# Patient Record
Sex: Female | Born: 1960 | Hispanic: Yes | Smoking: Never smoker
Health system: Southern US, Community
[De-identification: ages and names within clinical notes are randomized; demographics above are authoritative.]

## PROBLEM LIST (undated history)

## (undated) DIAGNOSIS — C73 Malignant neoplasm of thyroid gland: Secondary | ICD-10-CM

## (undated) DIAGNOSIS — M4800 Spinal stenosis, site unspecified: Secondary | ICD-10-CM

## (undated) DIAGNOSIS — I2699 Other pulmonary embolism without acute cor pulmonale: Secondary | ICD-10-CM

## (undated) DIAGNOSIS — E785 Hyperlipidemia, unspecified: Secondary | ICD-10-CM

## (undated) DIAGNOSIS — M797 Fibromyalgia: Secondary | ICD-10-CM

## (undated) DIAGNOSIS — G4730 Sleep apnea, unspecified: Secondary | ICD-10-CM

## (undated) DIAGNOSIS — I10 Essential (primary) hypertension: Secondary | ICD-10-CM

## (undated) DIAGNOSIS — K219 Gastro-esophageal reflux disease without esophagitis: Secondary | ICD-10-CM

## (undated) DIAGNOSIS — E119 Type 2 diabetes mellitus without complications: Secondary | ICD-10-CM

## (undated) DIAGNOSIS — Q652 Congenital dislocation of hip, unspecified: Secondary | ICD-10-CM

## (undated) DIAGNOSIS — IMO0001 Reserved for inherently not codable concepts without codable children: Secondary | ICD-10-CM

## (undated) DIAGNOSIS — M779 Enthesopathy, unspecified: Secondary | ICD-10-CM

## (undated) DIAGNOSIS — H9190 Unspecified hearing loss, unspecified ear: Secondary | ICD-10-CM

## (undated) DIAGNOSIS — I82409 Acute embolism and thrombosis of unspecified deep veins of unspecified lower extremity: Secondary | ICD-10-CM

## (undated) DIAGNOSIS — E669 Obesity, unspecified: Secondary | ICD-10-CM

## (undated) DIAGNOSIS — E039 Hypothyroidism, unspecified: Secondary | ICD-10-CM

## (undated) DIAGNOSIS — M109 Gout, unspecified: Secondary | ICD-10-CM

## (undated) DIAGNOSIS — R7303 Prediabetes: Secondary | ICD-10-CM

## (undated) HISTORY — DX: Enthesopathy, unspecified: M77.9

## (undated) HISTORY — PX: TOTAL HIP REVISION: SHX763

## (undated) HISTORY — PX: THYROID SURGERY: SHX805

## (undated) HISTORY — DX: Other pulmonary embolism without acute cor pulmonale: I26.99

## (undated) HISTORY — DX: Acute embolism and thrombosis of unspecified deep veins of unspecified lower extremity: I82.409

## (undated) HISTORY — DX: Spinal stenosis, site unspecified: M48.00

## (undated) HISTORY — PX: HIP SURGERY: SHX245

## (undated) HISTORY — PX: LIMB SPARING RESECTION HIP W/ SADDLE JOINT REPLACEMENT: SUR829

## (undated) HISTORY — DX: Type 2 diabetes mellitus without complications: E11.9

## (undated) HISTORY — DX: Unspecified hearing loss, unspecified ear: H91.90

## (undated) HISTORY — DX: Gout, unspecified: M10.9

---

## 2007-03-05 ENCOUNTER — Ambulatory Visit: Payer: Self-pay | Admitting: Pain Medicine

## 2007-03-11 ENCOUNTER — Ambulatory Visit: Payer: Self-pay | Admitting: Pain Medicine

## 2007-03-31 ENCOUNTER — Ambulatory Visit: Payer: Self-pay | Admitting: Pain Medicine

## 2007-04-02 ENCOUNTER — Ambulatory Visit: Payer: Self-pay | Admitting: Rheumatology

## 2007-04-06 ENCOUNTER — Ambulatory Visit: Payer: Self-pay | Admitting: Pain Medicine

## 2007-05-26 ENCOUNTER — Ambulatory Visit: Payer: Self-pay | Admitting: Pain Medicine

## 2007-06-08 ENCOUNTER — Ambulatory Visit: Payer: Self-pay | Admitting: Pain Medicine

## 2007-06-18 ENCOUNTER — Ambulatory Visit: Payer: Self-pay | Admitting: Pain Medicine

## 2007-06-23 ENCOUNTER — Ambulatory Visit: Payer: Self-pay | Admitting: Unknown Physician Specialty

## 2007-06-24 ENCOUNTER — Ambulatory Visit: Payer: Self-pay | Admitting: Pain Medicine

## 2007-07-23 ENCOUNTER — Ambulatory Visit: Payer: Self-pay | Admitting: Pain Medicine

## 2007-08-19 ENCOUNTER — Ambulatory Visit: Payer: Self-pay | Admitting: Pain Medicine

## 2007-08-22 ENCOUNTER — Emergency Department: Payer: Self-pay | Admitting: Emergency Medicine

## 2007-09-29 ENCOUNTER — Ambulatory Visit: Payer: Self-pay | Admitting: Pain Medicine

## 2007-10-01 ENCOUNTER — Ambulatory Visit: Payer: Self-pay | Admitting: Internal Medicine

## 2008-02-15 ENCOUNTER — Ambulatory Visit: Payer: Self-pay | Admitting: Pain Medicine

## 2008-04-20 ENCOUNTER — Ambulatory Visit: Payer: Self-pay | Admitting: Internal Medicine

## 2008-04-25 ENCOUNTER — Ambulatory Visit: Payer: Self-pay | Admitting: Pain Medicine

## 2008-05-24 ENCOUNTER — Ambulatory Visit: Payer: Self-pay | Admitting: Pain Medicine

## 2008-06-23 ENCOUNTER — Ambulatory Visit: Payer: Self-pay | Admitting: Pain Medicine

## 2008-07-16 ENCOUNTER — Ambulatory Visit: Payer: Self-pay

## 2008-08-28 ENCOUNTER — Emergency Department: Payer: Self-pay | Admitting: Emergency Medicine

## 2008-09-14 ENCOUNTER — Encounter: Payer: Self-pay | Admitting: Internal Medicine

## 2008-10-03 ENCOUNTER — Encounter: Payer: Self-pay | Admitting: Internal Medicine

## 2008-10-31 ENCOUNTER — Encounter: Payer: Self-pay | Admitting: Internal Medicine

## 2008-12-01 ENCOUNTER — Encounter: Payer: Self-pay | Admitting: Internal Medicine

## 2008-12-28 ENCOUNTER — Ambulatory Visit: Payer: Self-pay | Admitting: Internal Medicine

## 2009-01-12 ENCOUNTER — Ambulatory Visit: Payer: Self-pay | Admitting: Internal Medicine

## 2009-02-14 ENCOUNTER — Ambulatory Visit: Payer: Self-pay | Admitting: Internal Medicine

## 2009-03-07 ENCOUNTER — Ambulatory Visit: Payer: Self-pay | Admitting: Pain Medicine

## 2009-03-15 ENCOUNTER — Ambulatory Visit: Payer: Self-pay | Admitting: Pain Medicine

## 2009-05-16 ENCOUNTER — Ambulatory Visit: Payer: Self-pay | Admitting: Pain Medicine

## 2009-05-22 ENCOUNTER — Ambulatory Visit: Payer: Self-pay | Admitting: Pain Medicine

## 2009-06-15 ENCOUNTER — Ambulatory Visit: Payer: Self-pay | Admitting: Pain Medicine

## 2009-06-21 ENCOUNTER — Ambulatory Visit: Payer: Self-pay | Admitting: Pain Medicine

## 2009-08-01 ENCOUNTER — Ambulatory Visit: Payer: Self-pay | Admitting: Cardiovascular Disease

## 2009-08-02 ENCOUNTER — Ambulatory Visit: Payer: Self-pay | Admitting: Pain Medicine

## 2009-08-31 ENCOUNTER — Ambulatory Visit: Payer: Self-pay | Admitting: Pain Medicine

## 2009-09-20 ENCOUNTER — Ambulatory Visit: Payer: Self-pay | Admitting: Pain Medicine

## 2009-10-02 ENCOUNTER — Ambulatory Visit: Payer: Self-pay | Admitting: Pain Medicine

## 2009-10-26 ENCOUNTER — Ambulatory Visit: Payer: Self-pay | Admitting: Pain Medicine

## 2010-02-21 ENCOUNTER — Ambulatory Visit: Payer: Self-pay | Admitting: Internal Medicine

## 2010-06-12 ENCOUNTER — Ambulatory Visit: Payer: Self-pay | Admitting: Internal Medicine

## 2010-07-16 ENCOUNTER — Ambulatory Visit: Payer: Self-pay | Admitting: Neurology

## 2010-09-10 ENCOUNTER — Ambulatory Visit: Payer: Self-pay

## 2010-09-18 ENCOUNTER — Ambulatory Visit: Payer: Self-pay

## 2011-03-08 ENCOUNTER — Ambulatory Visit: Payer: Self-pay | Admitting: Emergency Medicine

## 2011-03-12 ENCOUNTER — Ambulatory Visit: Payer: Self-pay

## 2011-03-20 ENCOUNTER — Ambulatory Visit: Payer: Self-pay | Admitting: Internal Medicine

## 2011-04-23 ENCOUNTER — Encounter: Payer: Self-pay | Admitting: Rheumatology

## 2011-09-09 ENCOUNTER — Ambulatory Visit: Payer: Self-pay

## 2011-10-01 ENCOUNTER — Emergency Department: Payer: Self-pay | Admitting: *Deleted

## 2011-10-01 LAB — COMPREHENSIVE METABOLIC PANEL
Albumin: 3.6 g/dL (ref 3.4–5.0)
BUN: 10 mg/dL (ref 7–18)
Bilirubin,Total: 0.3 mg/dL (ref 0.2–1.0)
Calcium, Total: 9.1 mg/dL (ref 8.5–10.1)
EGFR (African American): >60
EGFR (Non-African Amer.): >60
Glucose: 111 mg/dL — ABNORMAL HIGH (ref 65–99)
Osmolality: 281 (ref 275–301)
Potassium: 3.6 mmol/L (ref 3.5–5.1)
SGOT(AST): 17 U/L (ref 15–37)
SGPT (ALT): 28 U/L
Total Protein: 7.3 g/dL (ref 6.4–8.2)

## 2011-10-01 LAB — CBC WITH DIFFERENTIAL/PLATELET
Basophil #: 0.1 10*3/uL (ref 0.0–0.1)
Eosinophil #: 0.1 10*3/uL (ref 0.0–0.7)
Eosinophil %: 1 %
HGB: 13.3 g/dL (ref 12.0–16.0)
Lymphocyte #: 2.1 10*3/uL (ref 1.0–3.6)
MCH: 29.7 pg (ref 26.0–34.0)
MCHC: 33.8 g/dL (ref 32.0–36.0)
MCV: 88 fL (ref 80–100)
Monocyte #: 0.8 10*3/uL — ABNORMAL HIGH (ref 0.0–0.7)
Neutrophil %: 70.3 %
Platelet: 231 10*3/uL (ref 150–440)
RBC: 4.46 10*6/uL (ref 3.80–5.20)
RDW: 12.5 % (ref 11.5–14.5)
WBC: 10.1 10*3/uL (ref 3.6–11.0)

## 2012-04-07 ENCOUNTER — Ambulatory Visit: Payer: Self-pay | Admitting: Internal Medicine

## 2012-07-31 ENCOUNTER — Ambulatory Visit: Payer: Self-pay | Admitting: Otolaryngology

## 2013-03-18 DIAGNOSIS — M5136 Other intervertebral disc degeneration, lumbar region: Secondary | ICD-10-CM | POA: Insufficient documentation

## 2013-03-18 DIAGNOSIS — M797 Fibromyalgia: Secondary | ICD-10-CM | POA: Insufficient documentation

## 2013-03-18 DIAGNOSIS — M545 Low back pain, unspecified: Secondary | ICD-10-CM | POA: Insufficient documentation

## 2013-05-19 ENCOUNTER — Ambulatory Visit: Payer: Self-pay | Admitting: Internal Medicine

## 2013-08-25 ENCOUNTER — Ambulatory Visit: Payer: Self-pay | Admitting: Orthopaedic Surgery

## 2014-03-31 ENCOUNTER — Ambulatory Visit: Payer: Self-pay | Admitting: General Surgery

## 2014-04-07 ENCOUNTER — Encounter: Payer: Self-pay | Admitting: *Deleted

## 2014-07-11 ENCOUNTER — Emergency Department: Payer: Self-pay | Admitting: Emergency Medicine

## 2014-07-11 LAB — URIC ACID: URIC ACID: 6 mg/dL (ref 2.6–6.0)

## 2014-07-15 DIAGNOSIS — Z85850 Personal history of malignant neoplasm of thyroid: Secondary | ICD-10-CM | POA: Insufficient documentation

## 2014-07-15 DIAGNOSIS — E890 Postprocedural hypothyroidism: Secondary | ICD-10-CM | POA: Insufficient documentation

## 2014-08-15 ENCOUNTER — Ambulatory Visit: Payer: Self-pay

## 2014-09-02 ENCOUNTER — Ambulatory Visit: Payer: Self-pay

## 2014-12-13 ENCOUNTER — Ambulatory Visit
Admit: 2014-12-13 | Disposition: A | Discharge status: Home / Self Care | Payer: Self-pay | Attending: Internal Medicine | Admitting: Internal Medicine

## 2014-12-28 ENCOUNTER — Ambulatory Visit: Admit: 2014-12-28 | Disposition: A | Discharge status: Home / Self Care | Payer: Self-pay | Admitting: Internal Medicine

## 2015-01-24 ENCOUNTER — Other Ambulatory Visit: Payer: Self-pay | Admitting: Gastroenterology

## 2015-01-24 DIAGNOSIS — R1310 Dysphagia, unspecified: Secondary | ICD-10-CM

## 2015-01-25 ENCOUNTER — Other Ambulatory Visit: Payer: Self-pay | Admitting: Nurse Practitioner

## 2015-01-25 DIAGNOSIS — N23 Unspecified renal colic: Secondary | ICD-10-CM

## 2015-01-27 ENCOUNTER — Ambulatory Visit
Admission: RE | Admit: 2015-01-27 | Discharge: 2015-01-27 | Disposition: A | Discharge status: Home / Self Care | Payer: Medicare Other | Source: Ambulatory Visit | Attending: Gastroenterology | Admitting: Gastroenterology

## 2015-01-27 DIAGNOSIS — M797 Fibromyalgia: Secondary | ICD-10-CM | POA: Insufficient documentation

## 2015-01-27 DIAGNOSIS — E785 Hyperlipidemia, unspecified: Secondary | ICD-10-CM | POA: Diagnosis not present

## 2015-01-27 DIAGNOSIS — E890 Postprocedural hypothyroidism: Secondary | ICD-10-CM | POA: Diagnosis not present

## 2015-01-27 DIAGNOSIS — R1310 Dysphagia, unspecified: Secondary | ICD-10-CM

## 2015-01-27 DIAGNOSIS — E669 Obesity, unspecified: Secondary | ICD-10-CM | POA: Diagnosis not present

## 2015-01-27 DIAGNOSIS — Z85850 Personal history of malignant neoplasm of thyroid: Secondary | ICD-10-CM | POA: Diagnosis not present

## 2015-01-27 DIAGNOSIS — I10 Essential (primary) hypertension: Secondary | ICD-10-CM | POA: Diagnosis not present

## 2015-01-27 DIAGNOSIS — G4733 Obstructive sleep apnea (adult) (pediatric): Secondary | ICD-10-CM | POA: Insufficient documentation

## 2015-02-02 ENCOUNTER — Ambulatory Visit: Payer: Medicare Other

## 2015-02-07 ENCOUNTER — Encounter: Payer: Self-pay | Admitting: *Deleted

## 2015-02-09 ENCOUNTER — Ambulatory Visit
Admission: RE | Admit: 2015-02-09 | Discharge: 2015-02-09 | Disposition: A | Discharge status: Home / Self Care | Payer: Medicare Other | Source: Ambulatory Visit | Attending: Gastroenterology | Admitting: Gastroenterology

## 2015-02-09 ENCOUNTER — Ambulatory Visit: Payer: Medicare Other | Admitting: Anesthesiology

## 2015-02-09 ENCOUNTER — Encounter: Payer: Self-pay | Admitting: *Deleted

## 2015-02-09 ENCOUNTER — Encounter
Admission: RE | Disposition: A | Discharge status: Home / Self Care | Payer: Self-pay | Source: Ambulatory Visit | Attending: Gastroenterology

## 2015-02-09 DIAGNOSIS — E039 Hypothyroidism, unspecified: Secondary | ICD-10-CM | POA: Insufficient documentation

## 2015-02-09 DIAGNOSIS — D123 Benign neoplasm of transverse colon: Secondary | ICD-10-CM | POA: Diagnosis not present

## 2015-02-09 DIAGNOSIS — Z1211 Encounter for screening for malignant neoplasm of colon: Secondary | ICD-10-CM | POA: Diagnosis present

## 2015-02-09 DIAGNOSIS — K219 Gastro-esophageal reflux disease without esophagitis: Secondary | ICD-10-CM | POA: Insufficient documentation

## 2015-02-09 DIAGNOSIS — M797 Fibromyalgia: Secondary | ICD-10-CM | POA: Insufficient documentation

## 2015-02-09 DIAGNOSIS — K5730 Diverticulosis of large intestine without perforation or abscess without bleeding: Secondary | ICD-10-CM | POA: Insufficient documentation

## 2015-02-09 DIAGNOSIS — E669 Obesity, unspecified: Secondary | ICD-10-CM | POA: Diagnosis not present

## 2015-02-09 DIAGNOSIS — D125 Benign neoplasm of sigmoid colon: Secondary | ICD-10-CM | POA: Diagnosis not present

## 2015-02-09 DIAGNOSIS — Z79899 Other long term (current) drug therapy: Secondary | ICD-10-CM | POA: Insufficient documentation

## 2015-02-09 DIAGNOSIS — Z85850 Personal history of malignant neoplasm of thyroid: Secondary | ICD-10-CM | POA: Diagnosis not present

## 2015-02-09 DIAGNOSIS — K2950 Unspecified chronic gastritis without bleeding: Secondary | ICD-10-CM | POA: Diagnosis not present

## 2015-02-09 DIAGNOSIS — I10 Essential (primary) hypertension: Secondary | ICD-10-CM | POA: Insufficient documentation

## 2015-02-09 DIAGNOSIS — E785 Hyperlipidemia, unspecified: Secondary | ICD-10-CM | POA: Insufficient documentation

## 2015-02-09 DIAGNOSIS — Z79891 Long term (current) use of opiate analgesic: Secondary | ICD-10-CM | POA: Insufficient documentation

## 2015-02-09 DIAGNOSIS — R1310 Dysphagia, unspecified: Secondary | ICD-10-CM | POA: Diagnosis present

## 2015-02-09 DIAGNOSIS — G4730 Sleep apnea, unspecified: Secondary | ICD-10-CM | POA: Diagnosis not present

## 2015-02-09 DIAGNOSIS — K317 Polyp of stomach and duodenum: Secondary | ICD-10-CM | POA: Insufficient documentation

## 2015-02-09 DIAGNOSIS — M419 Scoliosis, unspecified: Secondary | ICD-10-CM | POA: Insufficient documentation

## 2015-02-09 HISTORY — DX: Gastro-esophageal reflux disease without esophagitis: K21.9

## 2015-02-09 HISTORY — DX: Sleep apnea, unspecified: G47.30

## 2015-02-09 HISTORY — DX: Prediabetes: R73.03

## 2015-02-09 HISTORY — PX: ESOPHAGOGASTRODUODENOSCOPY: SHX5428

## 2015-02-09 HISTORY — DX: Fibromyalgia: M79.7

## 2015-02-09 HISTORY — DX: Hyperlipidemia, unspecified: E78.5

## 2015-02-09 HISTORY — DX: Hypothyroidism, unspecified: E03.9

## 2015-02-09 HISTORY — DX: Obesity, unspecified: E66.9

## 2015-02-09 HISTORY — DX: Reserved for inherently not codable concepts without codable children: IMO0001

## 2015-02-09 HISTORY — DX: Essential (primary) hypertension: I10

## 2015-02-09 HISTORY — DX: Malignant neoplasm of thyroid gland: C73

## 2015-02-09 HISTORY — PX: COLONOSCOPY WITH PROPOFOL: SHX5780

## 2015-02-09 HISTORY — DX: Congenital dislocation of hip, unspecified: Q65.2

## 2015-02-09 SURGERY — COLONOSCOPY WITH PROPOFOL
Anesthesia: General

## 2015-02-09 MED ORDER — PROPOFOL 10 MG/ML IV BOLUS
INTRAVENOUS | Status: DC | PRN
Start: 2015-02-09 — End: 2015-02-09
  Administered 2015-02-09: 50 mg via INTRAVENOUS

## 2015-02-09 MED ORDER — GLYCOPYRROLATE 0.2 MG/ML IJ SOLN
INTRAMUSCULAR | Status: DC | PRN
  Administered 2015-02-09: 0.2 mg via INTRAVENOUS

## 2015-02-09 MED ORDER — LIDOCAINE HCL (CARDIAC) 20 MG/ML IV SOLN
INTRAVENOUS | Status: DC | PRN
  Administered 2015-02-09: 30 mg via INTRAVENOUS

## 2015-02-09 MED ORDER — MIDAZOLAM HCL 5 MG/5ML IJ SOLN
INTRAMUSCULAR | Status: DC | PRN
  Administered 2015-02-09: 1 mg via INTRAVENOUS

## 2015-02-09 MED ORDER — FENTANYL CITRATE (PF) 100 MCG/2ML IJ SOLN
INTRAMUSCULAR | Status: DC | PRN
  Administered 2015-02-09: 50 ug via INTRAVENOUS

## 2015-02-09 MED ORDER — SODIUM CHLORIDE 0.9 % IV SOLN
INTRAVENOUS | Status: DC
  Administered 2015-02-09: 1000 mL via INTRAVENOUS

## 2015-02-09 MED ORDER — SODIUM CHLORIDE 0.9 % IV SOLN
INTRAVENOUS | Status: DC
  Administered 2015-02-09: 09:00:00 via INTRAVENOUS

## 2015-02-09 MED ORDER — PROPOFOL INFUSION 10 MG/ML OPTIME
INTRAVENOUS | Status: DC | PRN
  Administered 2015-02-09: 140 ug/kg/min via INTRAVENOUS

## 2015-02-09 NOTE — Anesthesia Postprocedure Evaluation (Deleted)
    Anesthesia Post-op Note  Patient: Katie Baker  Procedure(s) Performed: Procedure(s): COLONOSCOPY WITH PROPOFOL (N/A) ESOPHAGOGASTRODUODENOSCOPY (EGD) (N/A)    Anesthesia type:General  Patient location: PACU  Post pain: Pain level controlled  Post assessment: Post-op Vital signs reviewed, Patient's Cardiovascular Status Stable, Respiratory Function Stable, Patent Airway and No signs of Nausea or vomiting  Post vital signs: Reviewed and stable  Last Vitals:  Filed Vitals:   02/09/15 0955  BP: 138/86  Pulse: 99  Temp: 35.8 C  Resp: 10    Level of consciousness: awake, alert  and patient cooperative  Complications: No apparent anesthesia complications

## 2015-02-09 NOTE — Anesthesia Procedure Notes (Signed)
Procedures

## 2015-02-09 NOTE — Op Note (Signed)
[facility]astroenterology Patient Name: Katie Baker Procedure Date: 02/09/2015 9:03 AM MRN: [id number] Account #: [id number] Date of Birth: 15-Mar-1961 Admit Type: Outpatient Age: 54 Room: [facility] ENDO ROOM 3 Gender: Female Note Status: Finalized Procedure:         Upper GI endoscopy Indications:       Dysphagia, Heartburn, Suspected esophageal reflux Patient Profile:   This is a 54 year old female. Providers:         Gerrit Heck. Rayann Heman, MD Referring MD:      Perrin Maltese, MD (Referring MD) Medicines:         Propofol per Anesthesia Complications:     No immediate complications. Procedure:         Pre-Anesthesia Assessment:                    - Prior to the procedure, a History and Physical was                     performed, and patient medications and allergies were                     reviewed. The patient is competent. The risks and benefits                     of the procedure and the sedation options and risks were                     discussed with the patient. All questions were answered                     and informed consent was obtained. Patient identification                     and proposed procedure were verified by the physician and                     the nurse in the pre-procedure area. Mental Status                     Examination: alert and oriented. Airway Examination:                     normal oropharyngeal airway and neck mobility. Respiratory                     Examination: clear to auscultation. CV Examination: RRR,                     no murmurs, no S3 or S4. Prophylactic Antibiotics: The                     patient does not require prophylactic antibiotics. Prior                     Anticoagulants: The patient has taken no previous                     anticoagulant or antiplatelet agents. ASA Grade                     Assessment: III - A patient with severe systemic disease.                     After reviewing the risks and  benefits, the  patient was                     deemed in satisfactory condition to undergo the procedure.                     The anesthesia plan was to use monitored anesthesia care                     (MAC). Immediately prior to administration of medications,                     the patient was re-assessed for adequacy to receive                     sedatives. The heart rate, respiratory rate, oxygen                     saturations, blood pressure, adequacy of pulmonary                     ventilation, and response to care were monitored                     throughout the procedure. The physical status of the                     patient was re-assessed after the procedure.                    - Prior to the procedure, a History and Physical was                     performed, and patient medications, allergies and                     sensitivities were reviewed. The patient's tolerance of                     previous anesthesia was reviewed.                    After obtaining informed consent, the endoscope was passed                     under direct vision. Throughout the procedure, the                     patient's blood pressure, pulse, and oxygen saturations                     were monitored continuously. The Olympus GIF-160 endoscope                     (S#. [id number]) was introduced through the mouth, and                     advanced to the second part of duodenum. The patient                     tolerated the procedure well. Findings:      The esophagus was normal.      A few 2 to 3 mm sessile polyps were found in the gastric body. Biopsies       were taken with a cold forceps for histology.      The examined duodenum was normal.  Four biopsies were obtained with cold forceps for histology randomly in       the lower third of the esophagus.      Four biopsies were obtained with cold forceps for histology randomly in       the upper third of the esophagus. Impression:        - Normal  esophagus.                    - A few gastric polyps. Biopsied.                    - Normal examined duodenum.                    - Four biopsies were obtained in the lower third of the                     esophagus.                    - Four biopsies were obtained in the upper third of the                     esophagus. Recommendation:    - Continue present medications.                    - Await pathology results.                    - Perform a colonoscopy today.                    - Obtain speech/swallow eval for difficulty initiating                     swallow on barium swallow study                    - The findings and recommendations were discussed with the                     patient.                    - The findings and recommendations were discussed with the                     patient's family. Procedure Code(s): --- Professional ---                    [phone], Esophagogastroduodenoscopy, flexible, transoral;                     with biopsy, single or multiple CPT copyright 2014 American Medical Association. All rights reserved. The codes documented in this report are preliminary and upon coder review may  be revised to meet current compliance requirements. Mellody Life, MD 02/09/2015 9:18:32 AM This report has been signed electronically. Number of Addenda: 0 Note Initiated On: 02/09/2015 9:03 AM      [facility]

## 2015-02-09 NOTE — Op Note (Signed)
[facility] Gastroenterology Patient Name: Katie Baker Procedure Date: 02/09/2015 8:42 AM MRN: [id number] Account #: [id number] Date of Birth: 1961-07-23 Admit Type: Outpatient Age: 54 Room: [facility] ENDO ROOM 3 Gender: Female Note Status: Finalized Procedure:         Colonoscopy Indications:       Screening for colorectal malignant neoplasm, This is the                     patient's first colonoscopy Patient Profile:   This is a 54 year old female. Providers:         Gerrit Heck. Rayann Heman, MD Referring MD:      Perrin Maltese, MD (Referring MD) Medicines:         Propofol per Anesthesia Complications:     No immediate complications. Procedure:         Pre-Anesthesia Assessment:                    - See the other procedure note for documentation of the                     pre-procedure assessment.                    After obtaining informed consent, the colonoscope was                     passed under direct vision. Throughout the procedure, the                     patient's blood pressure, pulse, and oxygen saturations                     were monitored continuously. The Colonoscope was                     introduced through the anus and advanced to the the cecum,                     identified by appendiceal orifice and ileocecal valve. The                     colonoscopy was performed without difficulty. The patient                     tolerated the procedure well. The quality of the bowel                     preparation was excellent. Findings:      The perianal and digital rectal examinations were normal.      A 7 mm polyp was found in the transverse colon. The polyp was sessile.       The polyp was removed with a hot snare. Resection and retrieval were       complete.      A 4 mm polyp was found in the sigmoid colon. The polyp was sessile. The       polyp was removed with a cold snare. Resection and retrieval were       complete.      A few small-mouthed  diverticula were found in the sigmoid colon.      The exam was otherwise without abnormality on direct and retroflexion       views. Impression:        - One 7  mm polyp in the transverse colon. Resected and                     retrieved.                    - One 4 mm polyp in the sigmoid colon. Resected and                     retrieved.                    - Diverticulosis in the sigmoid colon.                    - The examination was otherwise normal on direct and                     retroflexion views. Recommendation:    - Observe patient in GI recovery unit.                    - Continue present medications.                    - Await pathology results.                    - Repeat colonoscopy for surveillance based on pathology                     results.                    - Return to referring physician.                    - The findings and recommendations were discussed with the                     patient.                    - The findings and recommendations were discussed with the                     patient's family. Procedure Code(s): --- Professional ---                    [phone], Colonoscopy, flexible; with removal of tumor(s),                     polyp(s), or other lesion(s) by snare technique CPT copyright 2014 American Medical Association. All rights reserved. The codes documented in this report are preliminary and upon coder review may  be revised to meet current compliance requirements. Mellody Life, MD 02/09/2015 9:48:18 AM This report has been signed electronically. Number of Addenda: 0 Note Initiated On: 02/09/2015 8:42 AM Scope Withdrawal Time: 0 hours 16 minutes 41 seconds  Total Procedure Duration: 0 hours 23 minutes 5 seconds       [facility]

## 2015-02-09 NOTE — Anesthesia Postprocedure Evaluation (Signed)
  Anesthesia Post-op Note  Patient: Katie Baker  Procedure(s) Performed: Procedure(s): COLONOSCOPY WITH PROPOFOL (N/A) ESOPHAGOGASTRODUODENOSCOPY (EGD) (N/A)  Anesthesia type:General  Patient location: PACU  Post pain: Pain level controlled  Post assessment: Post-op Vital signs reviewed, Patient's Cardiovascular Status Stable, Respiratory Function Stable, Patent Airway and No signs of Nausea or vomiting  Post vital signs: Reviewed and stable  Last Vitals:  Filed Vitals:   02/09/15 1030  BP: 99/68  Pulse: 81  Temp:   Resp: 15    Level of consciousness: awake, alert  and patient cooperative  Complications: No apparent anesthesia complications

## 2015-02-09 NOTE — Anesthesia Preprocedure Evaluation (Signed)
Anesthesia Evaluation  Patient identified by MRN, date of birth, ID band Patient awake    Reviewed: Allergy & Precautions, NPO status , Patient's Chart, lab work & pertinent test results  History of Anesthesia Complications Negative for: history of anesthetic complications  Airway Mallampati: II  TM Distance: >3 FB Neck ROM: Full    Dental no notable dental hx.    Pulmonary sleep apnea ,  breath sounds clear to auscultation  Pulmonary exam normal       Cardiovascular hypertension, Pt. on medications Normal cardiovascular examRhythm:Regular Rate:Normal     Neuro/Psych negative neurological ROS  negative psych ROS   GI/Hepatic negative GI ROS, Neg liver ROS,   Endo/Other  Hypothyroidism   Renal/GU negative Renal ROS  negative genitourinary   Musculoskeletal negative musculoskeletal ROS (+)   Abdominal   Peds negative pediatric ROS (+)  Hematology negative hematology ROS (+)   Anesthesia Other Findings   Reproductive/Obstetrics negative OB ROS                             Anesthesia Physical Anesthesia Plan  ASA: III  Anesthesia Plan: General   Post-op Pain Management:    Induction: Intravenous  Airway Management Planned: Nasal Cannula  Additional Equipment:   Intra-op Plan:   Post-operative Plan:   Informed Consent: I have reviewed the patients History and Physical, chart, labs and discussed the procedure including the risks, benefits and alternatives for the proposed anesthesia with the patient or authorized representative who has indicated his/her understanding and acceptance.     Plan Discussed with: CRNA and Surgeon  Anesthesia Plan Comments:         Anesthesia Quick Evaluation

## 2015-02-09 NOTE — Transfer of Care (Signed)
Immediate Anesthesia Transfer of Care Note  Patient: Katie Baker  Procedure(s) Performed: Procedure(s): COLONOSCOPY WITH PROPOFOL (N/A) ESOPHAGOGASTRODUODENOSCOPY (EGD) (N/A)  Patient Location: PACU  Anesthesia Type:General  Level of Consciousness: awake and patient cooperative  Airway & Oxygen Therapy: Patient Spontanous Breathing and Patient connected to nasal cannula oxygen  Post-op Assessment: Report given to RN and Post -op Vital signs reviewed and stable  Post vital signs: Reviewed and stable  Last Vitals:  Filed Vitals:   02/09/15 0955  BP: 138/86  Pulse: 99  Temp: 35.8 C  Resp: 10    Complications: No apparent anesthesia complications

## 2015-02-09 NOTE — H&P (Signed)
Primary Care Physician:  Perrin Maltese, MD  Pre-Procedure History & Physical: HPI:  Katie Baker is a 54 y.o. female is here for an colonoscopy / EGD   Past Medical History  Diagnosis Date  . Thyroid cancer   . Reflux   . Hypothyroidism   . Hypertension   . Hyperlipidemia   . Obesity   . Dislocation of hip, congenital   . Fibromyalgia   . Sleep apnea   . Prediabetes     History reviewed. No pertinent past surgical history.  Prior to Admission medications   Medication Sig Start Date End Date Taking? Authorizing Provider  CRESTOR 40 MG tablet Take 40 mg by mouth every evening. 01/14/15  Yes Historical Provider, MD  fluticasone (FLONASE) 50 MCG/ACT nasal spray Place 1 spray into both nostrils daily as needed. 01/06/15  Yes Historical Provider, MD  glucose blood test strip 1 each by Other route as needed for other. Use as instructed   Yes Historical Provider, MD  HYDROcodone-acetaminophen (NORCO/VICODIN) 5-325 MG per tablet Take 1 tablet by mouth every 6 (six) hours as needed for moderate pain.   Yes Historical Provider, MD  ibuprofen (ADVIL,MOTRIN) 200 MG tablet Take 800 mg by mouth.   Yes Historical Provider, MD  levothyroxine (SYNTHROID, LEVOTHROID) 137 MCG tablet Take 137-205.5 mcg by mouth daily. 1 tab for 6 days, Thursday take 1.5 tabs 11/05/14  Yes Historical Provider, MD  lisinopril-hydrochlorothiazide (PRINZIDE,ZESTORETIC) 20-25 MG per tablet Take 1 tablet by mouth daily. 01/14/15  Yes Historical Provider, MD  Melatonin 5 MG CAPS Take 1 capsule by mouth at bedtime as needed (sleep).   Yes Historical Provider, MD  mometasone (NASONEX) 50 MCG/ACT nasal spray Place 2 sprays into the nose.   Yes Historical Provider, MD  pantoprazole (PROTONIX) 40 MG tablet Take 40 mg by mouth daily. 11/29/14  Yes Historical Provider, MD  PAZEO 0.7 % SOLN Place 1 drop into both eyes 2 (two) times daily as needed. 01/10/15  Yes Historical Provider, MD  potassium chloride SA (K-DUR,KLOR-CON) 20 MEQ tablet  Take 20 mEq by mouth once.   Yes Historical Provider, MD  RESTASIS 0.05 % ophthalmic emulsion Place 1 drop into both eyes 2 (two) times daily. 01/02/15  Yes Historical Provider, MD  tiZANidine (ZANAFLEX) 4 MG capsule Take 4 mg by mouth 2 (two) times daily.   Yes Historical Provider, MD    Allergies as of 01/24/2015  . (Not on File)    History reviewed. No pertinent family history.  History   Social History  . Marital Status: Divorced    Spouse Name: N/A  . Number of Children: N/A  . Years of Education: N/A   Occupational History  . Not on file.   Social History Main Topics  . Smoking status: Never Smoker   . Smokeless tobacco: Not on file  . Alcohol Use: No  . Drug Use: No  . Sexual Activity: Not on file   Other Topics Concern  . Not on file   Social History Narrative     Physical Exam: BP 142/76 mmHg  Pulse 80  Temp(Src) 97.1 F (36.2 C)  Resp 20  Ht 4\' 8"  (1.422 m)  Wt 163 lb (73.936 kg)  BMI 36.56 kg/m2  LMP  (LMP Unknown) General:   Alert,  pleasant and cooperative in NAD Head:  Normocephalic and atraumatic. Neck:  Supple; no masses or thyromegaly. Lungs:  Clear throughout to auscultation.    Heart:  Regular rate and rhythm. Abdomen:  Soft,  nontender and nondistended. Normal bowel sounds, without guarding, and without rebound.   Neurologic:  Alert and  oriented x4;  grossly normal neurologically.  Impression/Plan: Katie Baker is here for an colonoscopy/ EGD to be performed for screening, dysphagia.   Risks, benefits, limitations, and alternatives regarding  Colonoscopy / EGD have been reviewed with the patient.  Questions have been answered.  All parties agreeable.   [name], MD  02/09/2015, 9:01 AM

## 2015-02-10 ENCOUNTER — Encounter: Payer: Self-pay | Admitting: Gastroenterology

## 2015-02-10 LAB — SURGICAL PATHOLOGY

## 2015-02-21 ENCOUNTER — Other Ambulatory Visit: Payer: Self-pay | Admitting: Gastroenterology

## 2015-02-21 DIAGNOSIS — R933 Abnormal findings on diagnostic imaging of other parts of digestive tract: Secondary | ICD-10-CM

## 2015-02-21 DIAGNOSIS — R1310 Dysphagia, unspecified: Secondary | ICD-10-CM

## 2015-02-27 ENCOUNTER — Ambulatory Visit: Payer: Medicare Other

## 2015-03-01 ENCOUNTER — Other Ambulatory Visit: Payer: Self-pay | Admitting: Neurology

## 2015-03-01 DIAGNOSIS — H9313 Tinnitus, bilateral: Secondary | ICD-10-CM

## 2015-03-01 DIAGNOSIS — G44221 Chronic tension-type headache, intractable: Secondary | ICD-10-CM

## 2015-03-08 ENCOUNTER — Ambulatory Visit: Payer: Medicare Other | Attending: Specialist

## 2015-03-08 DIAGNOSIS — G4733 Obstructive sleep apnea (adult) (pediatric): Secondary | ICD-10-CM | POA: Insufficient documentation

## 2015-03-09 ENCOUNTER — Ambulatory Visit
Admission: RE | Admit: 2015-03-09 | Discharge: 2015-03-09 | Disposition: A | Discharge status: Home / Self Care | Payer: Medicare Other | Source: Ambulatory Visit | Attending: Neurology | Admitting: Neurology

## 2015-03-09 DIAGNOSIS — H9313 Tinnitus, bilateral: Secondary | ICD-10-CM | POA: Diagnosis not present

## 2015-03-09 DIAGNOSIS — M4802 Spinal stenosis, cervical region: Secondary | ICD-10-CM | POA: Diagnosis not present

## 2015-03-09 DIAGNOSIS — G44221 Chronic tension-type headache, intractable: Secondary | ICD-10-CM | POA: Diagnosis present

## 2015-03-09 MED ORDER — GADOBENATE DIMEGLUMINE 529 MG/ML IV SOLN
15.0000 mL | Freq: Once | INTRAVENOUS | Status: AC | PRN
  Administered 2015-03-09: 15 mL via INTRAVENOUS

## 2015-03-16 ENCOUNTER — Other Ambulatory Visit: Payer: Self-pay | Admitting: Neurology

## 2015-03-16 DIAGNOSIS — M4802 Spinal stenosis, cervical region: Secondary | ICD-10-CM

## 2015-03-16 DIAGNOSIS — M542 Cervicalgia: Secondary | ICD-10-CM

## 2015-03-23 ENCOUNTER — Ambulatory Visit
Admission: RE | Admit: 2015-03-23 | Discharge: 2015-03-23 | Disposition: A | Discharge status: Home / Self Care | Payer: Medicare Other | Source: Ambulatory Visit | Attending: Neurology | Admitting: Neurology

## 2015-03-23 ENCOUNTER — Other Ambulatory Visit: Payer: Self-pay | Admitting: Neurology

## 2015-03-23 DIAGNOSIS — G44221 Chronic tension-type headache, intractable: Secondary | ICD-10-CM | POA: Diagnosis present

## 2015-03-23 DIAGNOSIS — M4802 Spinal stenosis, cervical region: Secondary | ICD-10-CM

## 2015-03-23 DIAGNOSIS — M2578 Osteophyte, vertebrae: Secondary | ICD-10-CM | POA: Diagnosis not present

## 2015-03-23 DIAGNOSIS — M542 Cervicalgia: Secondary | ICD-10-CM | POA: Diagnosis present

## 2015-03-27 ENCOUNTER — Ambulatory Visit
Admission: RE | Admit: 2015-03-27 | Discharge: 2015-03-27 | Disposition: A | Discharge status: Home / Self Care | Payer: Medicare Other | Source: Ambulatory Visit | Attending: Gastroenterology | Admitting: Gastroenterology

## 2015-03-27 DIAGNOSIS — K219 Gastro-esophageal reflux disease without esophagitis: Secondary | ICD-10-CM | POA: Insufficient documentation

## 2015-03-27 DIAGNOSIS — I10 Essential (primary) hypertension: Secondary | ICD-10-CM | POA: Insufficient documentation

## 2015-03-27 DIAGNOSIS — R933 Abnormal findings on diagnostic imaging of other parts of digestive tract: Secondary | ICD-10-CM | POA: Diagnosis present

## 2015-03-27 DIAGNOSIS — R1310 Dysphagia, unspecified: Secondary | ICD-10-CM | POA: Diagnosis present

## 2015-03-27 DIAGNOSIS — R7309 Other abnormal glucose: Secondary | ICD-10-CM | POA: Diagnosis not present

## 2015-03-27 DIAGNOSIS — E785 Hyperlipidemia, unspecified: Secondary | ICD-10-CM | POA: Insufficient documentation

## 2015-03-27 DIAGNOSIS — G4730 Sleep apnea, unspecified: Secondary | ICD-10-CM | POA: Insufficient documentation

## 2015-03-27 DIAGNOSIS — E890 Postprocedural hypothyroidism: Secondary | ICD-10-CM | POA: Insufficient documentation

## 2015-03-27 DIAGNOSIS — Z85850 Personal history of malignant neoplasm of thyroid: Secondary | ICD-10-CM | POA: Insufficient documentation

## 2015-03-27 DIAGNOSIS — E669 Obesity, unspecified: Secondary | ICD-10-CM | POA: Insufficient documentation

## 2015-03-27 DIAGNOSIS — M797 Fibromyalgia: Secondary | ICD-10-CM | POA: Diagnosis not present

## 2015-03-27 NOTE — Therapy (Signed)
[place], [place], [address] Phone: [phone]   Fax:     Modified Barium Swallow  Patient Details  Name: Katie Baker MRN: [id number] Date of Birth: [birth year] Referring Provider:  [name], MD  Encounter Date: 03/27/2015   Subjective: Patient behavior: (alertness, ability to follow instructions, etc.): pt A/O x3; followed instruction.  Chief complaint: quite concerned about her swallowing. Indicated she had more difficulty when swallowing pills she takes at home; indicated she predominantly ate chicken and rice at home but stated she can still eat her usual diet and has not had to change it. She stated she feels she has had some "change" in her swallowing since her thyroid resection and radiation tx ~4 years ago; and Bell's Palsy several years ago("my tongue is numb sometimes and it used to affect my eye"). Pt indicated she feels "tightness around my neck"; she has had a MRI of the head and neck(see chart for MRI results). Pt stated she has not seen an ENT at this time. 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 Pt endorses s/s of Anxiety at home stating she has "so many health problems... I just don't know anymore".     Objective:  Radiological Procedure: A videoflouroscopic evaluation of oral-preparatory, reflex initiation, and pharyngeal phases of the swallow was performed; as well as a screening of the upper esophageal phase.  I. POSTURE: upright II. VIEW: lateral  III. COMPENSATORY STRATEGIES: encouragement to relax  IV. BOLUSES ADMINISTERED:  Thin Liquid: 5 trials  Nectar-thick Liquid: 1 trial  Honey-thick Liquid: NT  Puree: 3 trials  Mechanical Soft: 1 trial  Barium Tablet: 1 trial  V. RESULTS OF EVALUATION: A. ORAL PREPARATORY PHASE: (The lips, tongue, and velum are observed for strength and coordination)  **Overall Severity Rating: grossly WFL during this exam. As trials continued but most notable w/ the Barium tablet, pt began to take longer during the oral phase. She exhibited increased mastication time w/ the soft solid and tilted her head back to try to swallow the barium tablet using a sip of water behind the puree to swallow/clear the tablet. No deficits were noted w/ the liquid/puree trials. Oral clearing was complete and appropriate. Native dentition noted.   B. SWALLOW INITIATION/REFLEX: (The reflex is normal if "triggered" by the time the bolus reached the base of the tongue)  **Overall Severity Rating: [facility] w/ all trial consistencies. Timely pharyngeal swallow initiation w/ all trials. No laryngeal penetration or aspiration noted during this exam.   C. PHARYNGEAL PHASE: (Pharyngeal function is normal if the bolus shows rapid, smooth, and continuous transit through the pharynx and there is no pharyngeal residue after the swallow)  **Overall Severity Rating: [facility] w/ all trial consistencies. Adequate and appropriate oral clearing noted w/ all trial consistencies indicating appropriate laryngeal excursion and pharyngeal pressure during the swallow.  D. LARYNGEAL PENETRATION: (Material entering into the laryngeal inlet/vestibule but not aspirated): NONE  E. ASPIRATION: NONE F. ESOPHAGEAL PHASE: (Screening of the upper esophagus): appropriate-appearing bolus motility through the upper Esophagus. No cervical Esophageal residue noted. Radiologist scanned the Esophagus noting adequate clearing of the Barium tablet; trials given.    ASSESSMENT: Pt appeared to present w/ adequate oropharyngeal swallow function; timely pharyngeal swallow initiation w/ no laryngeal penetration or aspiration noted and appropriate clearing of the oropharynx of any bolus residue post swallowing. Intermittently, and moreso w/ barium tablet trial, pt exhibited increased  oral phase time in anticipation of swallowing. Pt appeared to  hold the barium tablet then tilted her head back to aid her swallowing then finally used a sip of liquid behind the puree to swallow/clear the tablet. This presentation/deficit was not noted w/ the previous trials of liquids and foods. Suspect this presentation is similar to her description of her difficulty w/ taking pills at home; and solids intermittently. Due to no obvious deficit of function of anatomy during the swallow, pt could be experiencing increased anxiety w/ swallowing. Encouraged pt to view her swallowing on the video and be assured that no signficant deficits/aspiration were noted - she was able to swallow and clear all trials. Although, rec'd further f/u w/ Neurology and ENT for continued assessment; medications in liquid or powder/crushed forms for easier swallowing. Also rec'd pt moisten her foods well and take only single, small bites.   PLAN/RECOMMENDATIONS:  A. Diet: regular - mech soft; thin liquids   B. Swallowing Precautions: general precautions; Reflux precautions  C. Recommended consultation to Neurology; ENT  D. Therapy recommendations: none at this time  E. Results and recommendations were discussed w/ pt; video shown to her.      End of Session - 2015-04-24 1452    Visit Number 1   Number of Visits 1   Date for SLP Re-Evaluation 04-24-2015   SLP Start Time 42   SLP Stop Time  1406   SLP Time Calculation (min) 60 min   Activity Tolerance Patient tolerated treatment well      Past Medical History  Diagnosis Date  . Thyroid cancer   . Reflux   . Hypothyroidism   . Hypertension   . Hyperlipidemia   . Obesity   . Dislocation of hip, congenital   . Fibromyalgia   . Sleep apnea   . Prediabetes     Past Surgical History  Procedure Laterality Date  . Colonoscopy with propofol N/A 02/09/2015    Procedure: COLONOSCOPY WITH PROPOFOL;  Surgeon: [name], MD;  Location: [facility] ENDOSCOPY;  Service: Endoscopy;  Laterality: N/A;  . Esophagogastroduodenoscopy  N/A 02/09/2015    Procedure: ESOPHAGOGASTRODUODENOSCOPY (EGD);  Surgeon: [name], MD;  Location: [facility] ENDOSCOPY;  Service: Endoscopy;  Laterality: N/A;    There were no vitals filed for this visit.  Visit Diagnosis: Dysphagia - Plan: DG OP Swallowing Func-Medicare/Speech Path, DG OP Swallowing Func-Medicare/Speech Path  Abnormal barium swallow - Plan: DG OP Swallowing Func-Medicare/Speech Path, DG OP Swallowing Func-Medicare/Speech Path                               G-Codes - 2015-04-24 1503    Functional Assessment Tool Used clinical judgement; MBSS   Functional Limitations Swallowing   Swallow Current Status (I3382) At least 1 percent but less than 20 percent impaired, limited or restricted   Swallow Goal Status (N0539) At least 1 percent but less than 20 percent impaired, limited or restricted   Swallow Discharge Status [phone]) At least 1 percent but less than 20 percent impaired, limited or restricted          Problem List There are no active problems to display for this patient.  Orinda Kenner, MS, CCC-SLP Watson,Katherine 04-24-2015, 3:04 PM  [facility] [place], [place], [address] Phone: [phone]   Fax:

## 2015-05-17 ENCOUNTER — Ambulatory Visit (INDEPENDENT_AMBULATORY_CARE_PROVIDER_SITE_OTHER): Payer: Medicare Other | Admitting: Licensed Clinical Social Worker

## 2015-05-17 DIAGNOSIS — F329 Major depressive disorder, single episode, unspecified: Secondary | ICD-10-CM

## 2015-05-17 DIAGNOSIS — F0631 Mood disorder due to known physiological condition with depressive features: Secondary | ICD-10-CM
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
## 2015-05-30 ENCOUNTER — Other Ambulatory Visit: Payer: Self-pay | Admitting: Nurse Practitioner

## 2015-05-30 DIAGNOSIS — N23 Unspecified renal colic: Secondary | ICD-10-CM

## 2015-06-13 ENCOUNTER — Ambulatory Visit
Admission: RE | Admit: 2015-06-13 | Discharge: 2015-06-13 | Disposition: A | Discharge status: Home / Self Care | Payer: Medicare Other | Source: Ambulatory Visit | Attending: Nurse Practitioner | Admitting: Nurse Practitioner

## 2015-06-13 DIAGNOSIS — N23 Unspecified renal colic: Secondary | ICD-10-CM | POA: Diagnosis present

## 2015-06-19 ENCOUNTER — Ambulatory Visit (INDEPENDENT_AMBULATORY_CARE_PROVIDER_SITE_OTHER): Payer: Medicare Other | Admitting: Psychiatry

## 2015-06-19 ENCOUNTER — Ambulatory Visit (INDEPENDENT_AMBULATORY_CARE_PROVIDER_SITE_OTHER): Payer: Medicare Other | Admitting: Licensed Clinical Social Worker

## 2015-06-19 ENCOUNTER — Encounter: Payer: Self-pay | Admitting: Psychiatry

## 2015-06-19 VITALS — BP 128/84 | HR 84 | Temp 98.3°F | Ht <= 58 in | Wt 173.4 lb

## 2015-06-19 DIAGNOSIS — R7303 Prediabetes: Secondary | ICD-10-CM | POA: Insufficient documentation

## 2015-06-19 DIAGNOSIS — F332 Major depressive disorder, recurrent severe without psychotic features: Secondary | ICD-10-CM | POA: Diagnosis not present

## 2015-06-19 DIAGNOSIS — I10 Essential (primary) hypertension: Secondary | ICD-10-CM | POA: Insufficient documentation

## 2015-06-19 DIAGNOSIS — I152 Hypertension secondary to endocrine disorders: Secondary | ICD-10-CM | POA: Insufficient documentation

## 2015-06-19 DIAGNOSIS — E669 Obesity, unspecified: Secondary | ICD-10-CM | POA: Insufficient documentation

## 2015-06-19 DIAGNOSIS — Q652 Congenital dislocation of hip, unspecified: Secondary | ICD-10-CM | POA: Insufficient documentation

## 2015-06-19 DIAGNOSIS — E785 Hyperlipidemia, unspecified: Secondary | ICD-10-CM | POA: Insufficient documentation

## 2015-06-19 DIAGNOSIS — G4733 Obstructive sleep apnea (adult) (pediatric): Secondary | ICD-10-CM | POA: Insufficient documentation

## 2015-06-19 MED ORDER — SERTRALINE HCL 50 MG PO TABS: 50.0000 mg | ORAL_TABLET | Freq: Every day | ORAL | Status: DC
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
## 2015-06-23 NOTE — Progress Notes (Signed)
   THERAPIST PROGRESS NOTE  Session Time: 34min  Participation Level: Active  Behavioral Response: CasualAlertDepressed  Type of Therapy: Individual Therapy  Treatment Goals addressed: Coping  Interventions: CBT, Motivational Interviewing, Solution Focused and Reframing  Summary: Katie Baker is a 54 y.o. female who presents with continued symptoms of her diagnosis.  [name] was able to list her current stressors.    Suicidal/Homicidal: Nowithout intent/plan  Therapist Response: Assessed mood.  Explored coping strategies and role played how to use them.  Explored medical concerns and provided Patient with resources in the community to assist with hearing aid.  Plan: Return again in 2 weeks.  Diagnosis: Axis I: Major Depression, Recurrent, Severe    Axis II: No diagnosis    Lubertha South 06/23/2015

## 2015-06-29 ENCOUNTER — Other Ambulatory Visit: Payer: Self-pay | Admitting: Anesthesiology

## 2015-06-29 DIAGNOSIS — M5418 Radiculopathy, sacral and sacrococcygeal region: Secondary | ICD-10-CM

## 2015-07-11 ENCOUNTER — Ambulatory Visit
Admission: RE | Admit: 2015-07-11 | Discharge: 2015-07-11 | Disposition: A | Discharge status: Home / Self Care | Payer: Medicare Other | Source: Ambulatory Visit | Attending: Anesthesiology | Admitting: Anesthesiology

## 2015-07-11 DIAGNOSIS — M5136 Other intervertebral disc degeneration, lumbar region: Secondary | ICD-10-CM | POA: Insufficient documentation

## 2015-07-11 DIAGNOSIS — M5418 Radiculopathy, sacral and sacrococcygeal region: Secondary | ICD-10-CM

## 2015-07-11 DIAGNOSIS — M5412 Radiculopathy, cervical region: Secondary | ICD-10-CM | POA: Diagnosis present

## 2015-07-17 ENCOUNTER — Ambulatory Visit (INDEPENDENT_AMBULATORY_CARE_PROVIDER_SITE_OTHER): Payer: Medicare Other | Admitting: Psychiatry

## 2015-07-17 ENCOUNTER — Ambulatory Visit (INDEPENDENT_AMBULATORY_CARE_PROVIDER_SITE_OTHER): Payer: Medicare Other | Admitting: Licensed Clinical Social Worker

## 2015-07-17 ENCOUNTER — Encounter: Payer: Self-pay | Admitting: Psychiatry

## 2015-07-17 VITALS — BP 132/88 | HR 93 | Temp 98.5°F | Ht <= 58 in | Wt 179.0 lb

## 2015-07-17 DIAGNOSIS — F332 Major depressive disorder, recurrent severe without psychotic features: Secondary | ICD-10-CM

## 2015-07-17 DIAGNOSIS — F331 Major depressive disorder, recurrent, moderate: Secondary | ICD-10-CM | POA: Diagnosis not present

## 2015-07-17 MED ORDER — BUSPIRONE HCL 10 MG PO TABS: 10.0000 mg | ORAL_TABLET | Freq: Two times a day (BID) | ORAL | Status: DC

## 2015-07-17 NOTE — Progress Notes (Signed)
Psychiatric MD/NP Progress Note   Patient Identification: Katie Baker MRN:  [id number] Date of Evaluation:  07/17/2015  Chief Complaint:   Chief Complaint    Follow-up; Medication Refill     Visit Diagnosis:    ICD-9-CM ICD-10-CM   1. MDD (major depressive disorder), recurrent episode, moderate (HCC) 296.32 F33.1    Diagnosis:   Patient Active Problem List   Diagnosis Date Noted  . 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 HLD (hyperlipidemia) [E78.5] 06/19/2015  . BP (high blood pressure) [I10] 06/19/2015  . Adiposity [E66.9] 06/19/2015  . Borderline diabetes mellitus [R73.03] 06/19/2015  . Obstructive apnea [G47.33] 06/19/2015  . H/O malignant neoplasm of thyroid [Z85.850] 07/15/2014  . Hypothyroidism, postop [E89.0] 07/15/2014  . Degeneration of intervertebral disc of lumbar region [M51.36] 03/18/2013  . Fibromyalgia [M79.7] 03/18/2013  . 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 Patient reported that she has been having worsening of the pain due to the change in the weather as well as a reading today. She stated that she has stopped taking the Zoloft that she did not notice any improvement and felt that the combination of the medications was making her more tired. She remained focused on the worsening pain as well as the combination medications. She states that she is going to talk to her pain management doctor about dizziness related to the pain medication. She was noted to be rambling about the pain medications during the interview. She stated that she has been taking diazepam and pain medications during the daytime. She states that she wants some other techniques to help with depression. Her daughter wants her to be taking medications for  the depression. Later on the patient agreed to try some other medications. She currently denied having any suicidal homicidal ideations or plans.    Past Medical History:  Past Medical History  Diagnosis Date  . Thyroid cancer ([place])   . Reflux   . Hypothyroidism   . Hypertension   . Hyperlipidemia   . Obesity   . Dislocation of hip, congenital   . Fibromyalgia   . Sleep apnea   . Prediabetes   . Diabetes mellitus, type II ([place])   . Tendonitis   . Spinal stenosis     Past Surgical History  Procedure Laterality Date  . Colonoscopy with propofol N/A 02/09/2015    Procedure: COLONOSCOPY WITH PROPOFOL;  Surgeon: [name], MD;  Location: [facility] ENDOSCOPY;  Service: Endoscopy;  Laterality: N/A;  . Esophagogastroduodenoscopy N/A 02/09/2015    Procedure: ESOPHAGOGASTRODUODENOSCOPY (EGD);  Surgeon: [name], MD;  Location: [facility] ENDOSCOPY;  Service: Endoscopy;  Laterality: N/A;  . Thyroid surgery    . Hip surgery     Family History:  Family History  Problem Relation Age of Onset  . Heart disease Mother   . Hypertension Mother   . Alzheimer's disease Mother   . Diabetes Mother   . Stroke Mother   . Anxiety disorder Mother   . Diabetes Father   . Alcohol abuse Father   . Bladder Cancer Sister   . Thyroid disease Brother    Social History:   Social History   Social History  . Marital Status: Divorced    Spouse Name: N/A  .  Number of Children: N/A  . Years of Education: N/A   Social History Main Topics  . Smoking status: Never Smoker   . Smokeless tobacco: Never Used  . Alcohol Use: No  . Drug Use: No  . Sexual Activity: No   Other Topics Concern  . None   Social History Narrative   Additional Social History:  Moved from [place] in 2007. Lives with daughter  And has h/o Thyroid Cancer.   Musculoskeletal: Strength & Muscle Tone: decreased Gait & Station: unsteady Patient leans: N/A  Psychiatric Specialty Exam: HPI   Review of Systems   Constitutional: Positive for malaise/fatigue.  Musculoskeletal: Positive for back pain and joint pain.  Psychiatric/Behavioral: Positive for depression. The patient is nervous/anxious and has insomnia.   All other systems reviewed and are negative.   Blood pressure 132/88, pulse 93, temperature 98.5 F (36.9 C), temperature source Tympanic, height 4' 8" (1.422 m), weight 179 lb (81.194 kg), SpO2 94 %.Body mass index is 40.15 kg/(m^2).  General Appearance: Casual and Fairly Groomed  Eye Contact:  Fair  Speech:  Pressured  Volume:  Increased  Mood:  Anxious and Depressed  Affect:  Constricted and Depressed  Thought Process:  Disorganized  Orientation:  Full (Time, Place, and Person)  Thought Content:  WDL  Suicidal Thoughts:  No  Homicidal Thoughts:  No  Memory:  Immediate;   Fair  Judgement:  Fair  Insight:  Fair  Psychomotor Activity:  Decreased and Psychomotor Retardation  Concentration:  Fair  Recall:  [employer] of Knowledge:Fair  Language: Fair  Akathisia:  No  Handed:  Ambidextrous  AIMS (if indicated):    Assets:  Communication Skills Desire for Improvement Housing Physical Health Social Support  ADL's:  Intact  Cognition: WNL  Sleep:     Is the patient at risk to self?  No. Has the patient been a risk to self in the past 6 months?  No. Has the patient been a risk to self within the distant past?  No. Is the patient a risk to others?  No. Has the patient been a risk to others in the past 6 months?  No. Has the patient been a risk to others within the distant past?  No.  Allergies:  No Known Allergies Current Medications: Current Outpatient Prescriptions  Medication Sig Dispense Refill  . allopurinol (ZYLOPRIM) 100 MG tablet Take 100 mg by mouth daily.  3  . Blood Glucose Monitoring Suppl (ONE TOUCH ULTRA MINI) W/DEVICE KIT See admin instructions.  0  . CRESTOR 40 MG tablet Take 40 mg by mouth every evening.  3  . diazepam (VALIUM) 2 MG tablet Take 2 mg by  mouth at bedtime.  0  . fluticasone (FLONASE) 50 MCG/ACT nasal spray Place 1 spray into both nostrils daily as needed.  12  . gabapentin (NEURONTIN) 300 MG capsule TAKE ONE CAPSULE BY MOUTH AT BEDTIME FOR 7 DAYS THEN INCREASE TO 1 CAP EVERY 12 HOURS  0  . glucose blood test strip 1 each by Other route as needed for other. Use as instructed    . HYDROcodone-acetaminophen (NORCO/VICODIN) 5-325 MG per tablet Take 1 tablet by mouth every 6 (six) hours as needed for moderate pain.    Marland Kitchen ibuprofen (ADVIL,MOTRIN) 200 MG tablet Take 800 mg by mouth.    . levothyroxine (SYNTHROID, LEVOTHROID) 137 MCG tablet Take 137-205.5 mcg by mouth daily. 1 tab for 6 days, Thursday take 1.5 tabs  3  . lisinopril (PRINIVIL,ZESTRIL) 20 MG  tablet TAKE 1 TABLET BY MOUTH ONCE A DAY IN THE MORNING  2  . lisinopril-hydrochlorothiazide (PRINZIDE,ZESTORETIC) 20-25 MG per tablet Take 1 tablet by mouth daily.  3  . Melatonin 5 MG CAPS Take 1 capsule by mouth at bedtime as needed (sleep).    . mometasone (NASONEX) 50 MCG/ACT nasal spray Place 2 sprays into the nose.    . montelukast (SINGULAIR) 10 MG tablet TAKE 1 TABLET BY MOUTH DAILY IN AM  3  . ONETOUCH DELICA LANCETS 33G MISC TEST EVERY DAY  6  . oxyCODONE (ROXICODONE) 5 MG/5ML solution Take by mouth.    . oxyCODONE-acetaminophen (PERCOCET/ROXICET) 5-325 MG tablet Take 1 tablet by mouth every 12 (twelve) hours.  0  . pantoprazole (PROTONIX) 40 MG tablet Take 40 mg by mouth daily.  5  . PAZEO 0.7 % SOLN Place 1 drop into both eyes 2 (two) times daily as needed.  3  . potassium chloride SA (K-DUR,KLOR-CON) 20 MEQ tablet Take 20 mEq by mouth once.    . RESTASIS 0.05 % ophthalmic emulsion Place 1 drop into both eyes 2 (two) times daily.  3  . sertraline (ZOLOFT) 50 MG tablet Take 1 tablet (50 mg total) by mouth daily. 30 tablet 1  . Silica (SILICON DIOXIDE) POWD Take by mouth.    . tiZANidine (ZANAFLEX) 4 MG capsule Take 4 mg by mouth 2 (two) times daily.     No current  facility-administered medications for this visit.    Previous Psychotropic Medications: Tried Cymbalta- making her too sleepy, Lyrica- too sedated.  Patient denied any previous history of psychiatric hospitalization and suicide attempts    Substance Abuse History in the last 12 months:  No.  Consequences of Substance Abuse: Negative NA  Medical Decision Making:  Review of Psycho-Social Stressors (1) and Review and summation of old records (2)  Treatment Plan Summary: Medication management   Depression and anxiety  Discontinue Zoloft as patient has already stopped taking the medication.  I will start her on buspirone 10 mg daily for 1 week and then titrate the dose to 10 mg by mouth twice a day. Discussed with her about the adverse effects of the medications  and she demonstrated understanding.   Anxiety Patient is already on Valium 2 mg by mouth daily started by her pain management  Follow-up 1 month or earlier depending on her symptoms      Uzma Faheem 11/14/20162:12 PM  

## 2015-07-18 ENCOUNTER — Encounter: Payer: Self-pay | Admitting: *Deleted

## 2015-07-18 ENCOUNTER — Encounter: Payer: Medicare Other | Attending: Internal Medicine | Admitting: *Deleted

## 2015-07-18 VITALS — BP 118/70 | Ht <= 58 in | Wt 176.5 lb

## 2015-07-18 DIAGNOSIS — E119 Type 2 diabetes mellitus without complications: Secondary | ICD-10-CM

## 2015-07-18 NOTE — Progress Notes (Signed)
Diabetes Self-Management Education  Visit Type: First/Initial  Appt. Start Time: 1130 Appt. End Time: [id number]  07/18/2015  [name], identified by name and date of birth, is a 54 y.o. female with a diagnosis of Diabetes: Type 2.   ASSESSMENT  Blood pressure 118/70, height 4\' 8"  (1.422 m), weight 176 lb 8 oz (80.06 kg). Body mass index is 39.59 kg/(m^2).      Diabetes Self-Management Education - 07/18/15 1207    Visit Information   Visit Type First/Initial   Initial Visit   Diabetes Type Type 2   Are you currently following a meal plan? No   Are you taking your medications as prescribed? Yes   Date Diagnosed Pt reports 1 week ago but she came to the [facility] last Dec for diabetes education but never completed classes.   Health Coping   How would you rate your overall health? Poor   Psychosocial Assessment   Patient Belief/Attitude about Diabetes Denial   Self-care barriers Hard of hearing;Unsteady gait/risk for falls   Self-management support Doctor's office;Family   Other persons present Family Member  Daughter   Patient Concerns Nutrition/Meal planning;Monitoring;Glycemic Control;Weight Control;Healthy Lifestyle;Problem Solving   Special Needs None   Preferred Learning Style Hands on;Auditory   Learning Readiness Contemplating   How often do you need to have someone help you when you read instructions, pamphlets, or other written materials from your doctor or pharmacy? 2 - Rarely   What is the last grade level you completed in school? [id number]   Complications   Last HgB A1C per patient/outside source 6.7 %  06/26/15   How often do you check your blood sugar? 0 times/day (not testing)  Pt brought One Touch Ultra Mini meter to office. Instructed on use. BG upon return demonstration was 205 mg/dL at 12:55 pm - 3  1/2 hrs pp.   Have you had a dilated eye exam in the past 12 months? Yes   Have you had a dental exam in the past 12 months? Yes   Are you checking your feet? No    Dietary Intake   Breakfast egg, yucca or [place] and cheese, bread   Lunch skips   Dinner skips or has checken, beef, rice, corn, peas   Beverage(s) water, coffee with sugar, milk with sugar   Exercise   Exercise Type ADL's   Patient Education   Previous Diabetes Education Yes (please comment)  pt was seen last Dec but didn't return for classes   Disease state  Definition of diabetes, type 1 and 2, and the diagnosis of diabetes   Nutrition management  Role of diet in the treatment of diabetes and the relationship between the three main macronutrients and blood glucose level;Food label reading, portion sizes and measuring food.   Physical activity and exercise  Role of exercise on diabetes management, blood pressure control and cardiac health.   Monitoring Taught/evaluated SMBG meter.;Purpose and frequency of SMBG.;Identified appropriate SMBG and/or A1C goals.   Chronic complications Relationship between chronic complications and blood glucose control   Psychosocial adjustment Role of stress on diabetes;Identified and addressed patients feelings and concerns about diabetes   Individualized Goals (developed by patient)   Reducing Risk Improve blood sugars Decrease medications Prevent diabetes complications Lose weight Lead a healthier lifestyle   Outcomes   Expected Outcomes Demonstrated limited interest in learning.  Expect minimal changes      Individualized Plan for Diabetes Self-Management Training:   Learning Objective:  Patient will have a greater  understanding of diabetes self-management. Patient education plan is to attend individual and/or group sessions per assessed needs and concerns.   Plan:   Patient Instructions  Check blood sugars 2 x day before breakfast or 2 hrs after one meal every day Eat 3 meals day,  1-2  snacks a day Space meals 4-6 hours apart Don't skip meals Avoid sugar sweetened drinks (milk, coffee) Complete 3 Day Food Record and bring to next  appt Bring blood sugar records to the next appointment Return for appointment on:  Wednesday August 02, 2015 at 10:00 am with [facility] (dietitian)   Expected Outcomes:  Demonstrated limited interest in learning.  Expect minimal changes;Pt reports she is still in denial.   Education material provided:  General Meal Planning Guidelines 3 Day Food Record  If problems or questions, patient to contact team via:   Johny Drilling, [place], CCM, CDE [phone]  Future DSME appointment:  Since she has been to education in the past, she will qualify for the 2 Hr Refresher program. Next appointment August 02, 2015 with dietitian.

## 2015-07-18 NOTE — Patient Instructions (Addendum)
Check blood sugars 2 x day before breakfast or 2 hrs after one meal every day Eat 3 meals day,  1-2  snacks a day Space meals 4-6 hours apart Don't skip meals Avoid sugar sweetened drinks (milk, coffee) Complete 3 Day Food Record and bring to next appt Bring blood sugar records to the next appointment Return for appointment on:  Wednesday August 02, 2015 at 10:00 am with [facility] (dietitian)

## 2015-07-25 NOTE — Progress Notes (Signed)
   THERAPIST PROGRESS NOTE  Session Time: 98min  Participation Level: Active  Behavioral Response: CasualAlertHopeless  Type of Therapy: Individual Therapy  Treatment Goals addressed: Coping  Interventions: CBT, Motivational Interviewing, Solution Focused, Strength-based, Supportive, Family Systems and Reframing  Summary: Katie Baker is a 54 y.o. female who presents with continued symptoms of her diagnosis.  She denies any relief with medication or any interventions discussed in past sessions.  She reports that she is unable to participate in social activities due to her medical concerns.  She reports that she only eats, sleeps and gets on the computer.  She denies restful sleep.   Suicidal/Homicidal: Nowithout intent/plan  Therapist Response: LCSW provided Patient with ongoing emotional support and encouragement.  Normalized her feelings.  Commended Patient on her progress and reinforced the importance of client staying focused on her own strengths and resources and resiliency. Processed various strategies for dealing with stressors. patient is not motivated to change.   Plan: Return again in 2 weeks.  Diagnosis: Axis I: Major Depression, Recurrent severe    Axis II: No diagnosis    Lubertha South 07/25/2015

## 2015-08-02 ENCOUNTER — Ambulatory Visit: Payer: Medicare Other | Admitting: Dietician

## 2015-08-15 ENCOUNTER — Ambulatory Visit (INDEPENDENT_AMBULATORY_CARE_PROVIDER_SITE_OTHER): Payer: Medicare Other | Admitting: Licensed Clinical Social Worker

## 2015-08-15 ENCOUNTER — Ambulatory Visit (INDEPENDENT_AMBULATORY_CARE_PROVIDER_SITE_OTHER): Payer: Medicare Other | Admitting: Psychiatry

## 2015-08-15 ENCOUNTER — Ambulatory Visit: Payer: Medicare Other | Admitting: Psychiatry

## 2015-08-15 ENCOUNTER — Encounter: Payer: Self-pay | Admitting: Psychiatry

## 2015-08-15 VITALS — BP 128/86 | HR 81 | Temp 98.7°F | Ht <= 58 in | Wt 177.8 lb

## 2015-08-15 DIAGNOSIS — F332 Major depressive disorder, recurrent severe without psychotic features: Secondary | ICD-10-CM

## 2015-08-15 DIAGNOSIS — F331 Major depressive disorder, recurrent, moderate: Secondary | ICD-10-CM | POA: Diagnosis not present

## 2015-08-15 MED ORDER — BUSPIRONE HCL 10 MG PO TABS: 10.0000 mg | ORAL_TABLET | Freq: Three times a day (TID) | ORAL | Status: DC

## 2015-08-15 NOTE — Progress Notes (Signed)
Psychiatric MD/NP Progress Note   Patient Identification: Katie Baker MRN:  [id number] Date of Evaluation:  08/15/2015  Chief Complaint:   Chief Complaint    Follow-up; Medication Refill     Visit Diagnosis:    ICD-9-CM ICD-10-CM   1. MDD (major depressive disorder), recurrent episode, moderate (HCC) 296.32 F33.1    Diagnosis:   Patient Active Problem List   Diagnosis Date Noted  . 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 HLD (hyperlipidemia) [E78.5] 06/19/2015  . BP (high blood pressure) [I10] 06/19/2015  . Adiposity [E66.9] 06/19/2015  . Borderline diabetes mellitus [R73.03] 06/19/2015  . Obstructive apnea [G47.33] 06/19/2015  . H/O malignant neoplasm of thyroid [Z85.850] 07/15/2014  . Hypothyroidism, postop [E89.0] 07/15/2014  . Degeneration of intervertebral disc of lumbar region [M51.36] 03/18/2013  . Fibromyalgia [M79.7] 03/18/2013  . LBP (low back pain) [M54.5] 03/18/2013   History of Present Illness:    Patient is a 54 year old female who presented for follow up.She reported that she is concerned about her daughter who is recuperating from the gastric bypass surgery. She reported that her daughter is in continuous pain and she will be going back to her physician to discuss about the pain management. Patient reported that she also is not sleeping well although she is taking diazepam 2 mg prescribed by her pain management physician Dr. Humphrey Rolls. She stated that she was also started on gabapentin and has been taking them as prescribed. Patient reported that she feels concerned about her daughter and has not of stress on her mind. She stated that the Zoloft was discontinued at her last appointment. She is reporting good symptom relief with the help of BuSpar on and is willing to titrate the dose to 3 times daily. She currently denied having any mood swings anger anxiety or paranoia. She has been compliant with her medications. She is planning to buy a pill box  after this visit so she can organize her medications. She follows with Dr. Humphrey Rolls her primary care physician who is currently on vacation. She reported that she has sleep apnea and is not using the CPAP machine on a regular basis. She appeared clear and conversant during the interview.   Patient currently denied having any suicidal homicidal ideations or plans.    Past Medical History:  Past Medical History  Diagnosis Date  . Thyroid cancer ([place])   . Reflux   . Hypothyroidism   . Hypertension   . Hyperlipidemia   . Obesity   . Dislocation of hip, congenital   . Fibromyalgia   . Sleep apnea   . Prediabetes   . Diabetes mellitus, type II ([place])   . Tendonitis   . Spinal stenosis     Past Surgical History  Procedure Laterality Date  . Colonoscopy with propofol N/A 02/09/2015    Procedure: COLONOSCOPY WITH PROPOFOL;  Surgeon: [name], MD;  Location: [facility] ENDOSCOPY;  Service: Endoscopy;  Laterality: N/A;  . Esophagogastroduodenoscopy N/A 02/09/2015    Procedure: ESOPHAGOGASTRODUODENOSCOPY (EGD);  Surgeon: [name], MD;  Location: [facility] ENDOSCOPY;  Service: Endoscopy;  Laterality: N/A;  . Thyroid surgery    . Hip surgery     Family History:  Family History  Problem Relation Age of Onset  . Heart disease Mother   . Hypertension Mother   . Alzheimer's disease Mother   . Diabetes Mother   . Stroke Mother   . Anxiety disorder Mother   . Diabetes Father   . Alcohol abuse  Father   . Bladder Cancer Sister   . Thyroid disease Brother   . Diabetes Daughter   . Diabetes Maternal Aunt    Social History:   Social History   Social History  . Marital Status: Divorced    Spouse Name: N/A  . Number of Children: N/A  . Years of Education: N/A   Social History Main Topics  . Smoking status: Never Smoker   . Smokeless tobacco: Never Used  . Alcohol Use: No  . Drug Use: No  . Sexual Activity: No   Other Topics Concern  . None   Social History Narrative    Additional Social History:  Moved from [place] in 2007. Lives with daughter  And has h/o Thyroid Cancer.   Musculoskeletal: Strength & Muscle Tone: decreased Gait & Station: unsteady Patient leans: N/A  Psychiatric Specialty Exam: HPI   Review of Systems  Constitutional: Positive for malaise/fatigue.  Musculoskeletal: Positive for back pain and joint pain.  Psychiatric/Behavioral: Positive for depression. The patient is nervous/anxious and has insomnia.   All other systems reviewed and are negative.   Blood pressure 128/86, pulse 81, temperature 98.7 F (37.1 C), temperature source Tympanic, height 4' 8"  (1.422 m), weight 177 lb 12.8 oz (80.65 kg), SpO2 95 %.Body mass index is 39.88 kg/(m^2).  General Appearance: Casual and Fairly Groomed  Eye Contact:  Fair  Speech:  Pressured  Volume:  Increased  Mood:  Anxious  Affect:  Congruent  Thought Process:  Coherent  Orientation:  Full (Time, Place, and Person)  Thought Content:  WDL  Suicidal Thoughts:  No  Homicidal Thoughts:  No  Memory:  Immediate;   Fair  Judgement:  Fair  Insight:  Fair  Psychomotor Activity:  Decreased and Psychomotor Retardation  Concentration:  Fair  Recall:  [employer] of Knowledge:Fair  Language: Fair  Akathisia:  No  Handed:  Ambidextrous  AIMS (if indicated):    Assets:  Communication Skills Desire for Improvement Housing Physical Health Social Support  ADL's:  Intact  Cognition: WNL  Sleep:     Is the patient at risk to self?  No. Has the patient been a risk to self in the past 6 months?  No. Has the patient been a risk to self within the distant past?  No. Is the patient a risk to others?  No. Has the patient been a risk to others in the past 6 months?  No. Has the patient been a risk to others within the distant past?  No.  Allergies:  No Known Allergies Current Medications: Current Outpatient Prescriptions  Medication Sig Dispense Refill  . allopurinol (ZYLOPRIM) 100 MG tablet  Take 100 mg by mouth daily.  3  . Blood Glucose Monitoring Suppl (ONE TOUCH ULTRA MINI) W/DEVICE KIT See admin instructions.  0  . busPIRone (BUSPAR) 10 MG tablet Take 1 tablet (10 mg total) by mouth 2 (two) times daily. Take 1 pill daily x 1 week then BID 60 tablet 1  . CRESTOR 40 MG tablet Take 40 mg by mouth every evening.  3  . diazepam (VALIUM) 2 MG tablet Take 2 mg by mouth at bedtime.  0  . fluticasone (FLONASE) 50 MCG/ACT nasal spray Place 1 spray into both nostrils daily as needed.  12  . gabapentin (NEURONTIN) 300 MG capsule TAKE ONE CAPSULE BY MOUTH AT BEDTIME FOR 7 DAYS THEN INCREASE TO 1 CAP EVERY 12 HOURS  0  . glucose blood test strip 1 each by Other  route as needed for other. Use as instructed    . ibuprofen (ADVIL,MOTRIN) 200 MG tablet Take 800 mg by mouth every 6 (six) hours as needed.     Marland Kitchen levothyroxine (SYNTHROID, LEVOTHROID) 137 MCG tablet Take 137-205.5 mcg by mouth daily. 1 tab for 6 days, Thursday take 1.5 tabs  3  . lisinopril-hydrochlorothiazide (PRINZIDE,ZESTORETIC) 20-25 MG per tablet Take 1 tablet by mouth daily.  3  . Melatonin 5 MG CAPS Take 1 capsule by mouth at bedtime as needed (sleep).    . montelukast (SINGULAIR) 10 MG tablet TAKE 1 TABLET BY MOUTH DAILY IN AM  3  . ONETOUCH DELICA LANCETS 09U MISC TEST EVERY DAY  6  . oxyCODONE-acetaminophen (PERCOCET/ROXICET) 5-325 MG tablet Take 1 tablet by mouth every 12 (twelve) hours.  0  . pantoprazole (PROTONIX) 40 MG tablet Take 40 mg by mouth daily.  5  . PAZEO 0.7 % SOLN Place 1 drop into both eyes 2 (two) times daily as needed.  3  . potassium chloride SA (K-DUR,KLOR-CON) 20 MEQ tablet Take 20 mEq by mouth daily.     . RESTASIS 0.05 % ophthalmic emulsion Place 1 drop into both eyes 2 (two) times daily.  3  . tiZANidine (ZANAFLEX) 4 MG tablet     . oxyCODONE (ROXICODONE) 5 MG/5ML solution Take by mouth.    . Silica (SILICON DIOXIDE) POWD Take by mouth.     No current facility-administered medications for this  visit.    Previous Psychotropic Medications: Tried Cymbalta- making her too sleepy, Lyrica- too sedated.  Patient denied any previous history of psychiatric hospitalization and suicide attempts    Substance Abuse History in the last 12 months:  No.  Consequences of Substance Abuse: Negative NA  Medical Decision Making:  Review of Psycho-Social Stressors (1) and Review and summation of old records (2)  Treatment Plan Summary: Medication management   Depression and anxiety   I will start her on buspirone 10 mg daily  TID.   Anxiety Patient is already on Valium 2 mg by mouth daily started by her pain management  Follow-up 3 month or earlier depending on her symptoms    More than 50% of the time spent in psychoeducation, counseling and coordination of care.    This note was generated in part or whole with voice recognition software. Voice regonition is usually quite accurate but there are transcription errors that can and very often do occur. I apologize for any typographical errors that were not detected and corrected.      Rainey Pines 12/13/20161:34 PM

## 2015-08-16 ENCOUNTER — Encounter: Payer: Medicare Other | Attending: Internal Medicine | Admitting: Dietician

## 2015-08-16 VITALS — BP 130/90 | Ht <= 58 in | Wt 176.9 lb

## 2015-08-16 DIAGNOSIS — E119 Type 2 diabetes mellitus without complications: Secondary | ICD-10-CM | POA: Insufficient documentation

## 2015-08-16 NOTE — Patient Instructions (Signed)
   Advised patient to eat something every 3-5 hours throughout the day  Advised a protein source, controlled carb portions, and inclusion of vegetables with each meal.   Patient will discuss with MD whether additional nutrition visits would be beneficial.

## 2015-08-16 NOTE — Progress Notes (Signed)
Diabetes Self-Management Education  Visit Type:  Follow-up  Appt. Start Time: 1315 Appt. End Time: 1430  08/16/2015  [name], identified by name and date of birth, is a 54 y.o. female with a diagnosis of Diabetes:  .   ASSESSMENT  Blood pressure 130/90, height 4\' 8"  (1.422 m), weight 176 lb 14.4 oz (80.241 kg). Body mass index is 39.68 kg/(m^2).       Diabetes Self-Management Education - [id number]    Complications   How often do you check your blood sugar? 1-2 times/day  fasting BG daily   Fasting Blood glucose range (mg/dL) 70-129;130-179   Number of hypoglycemic episodes per month --  reports several times having low BG symptoms, treats with milk with added sugar or peppermint candy   Have you had a dilated eye exam in the past 12 months? Yes  appt with specialist 08/21/15 due to [place]   Have you had a dental exam in the past 12 months? Yes   Are you checking your feet? Yes   How many days per week are you checking your feet? 1  daughter checks occasionally; patient unable to bend over to check   Exercise   Exercise Type ADL's  low mobility   Patient Education   Nutrition management  Role of diet in the treatment of diabetes and the relationship between the three main macronutrients and blood glucose level;Food label reading, portion sizes and measuring food.;Meal timing in regards to the patients' current diabetes medication.;Meal options for control of blood glucose level and chronic complications.;Other (comment)  1000-1200kcal for weight loss due to limited mobility and thyroid disease -- advised patient that meeting basic nutrient needs will be difficult at that caloric level.   Chronic complications Assessed and discussed foot care and prevention of foot problems   Outcomes   Program Status Completed      Learning Objective:  Patient will have a greater understanding of diabetes self-management. Patient education plan is to attend individual and/or  group sessions per assessed needs and concerns.   Plan:   Patient Instructions   Advised patient to eat something every 3-5 hours throughout the day  Advised a protein source, controlled carb portions, and inclusion of vegetables with each meal.   Patient will discuss with MD whether additional nutrition visits would be beneficial.      Expected Outcomes:  Demonstrated interest in learning. Expect positive outcomes  Education material provided: Simple Meal Planner         Quick and Healthy Meal Ideas         Food Diary with enhancements         Smart Snacking  If problems or questions, patient to contact team via:  Phone  Future DSME appointment: -  Patient to consider additional Medical Nutrition Therapy visits.

## 2015-08-22 NOTE — Progress Notes (Signed)
   THERAPIST PROGRESS NOTE  Session Time: 43min  Participation Level: Active  Behavioral Response: Casual and NeatAlertDepressed  Type of Therapy: Individual Therapy  Treatment Goals addressed: Coping  Interventions: CBT, Motivational Interviewing, Solution Focused, Supportive and Reframing  Summary: Katie Baker is a 54 y.o. female who presents with continued symptoms of her diagnosis.  She denies using coping skills previously taught due to her medical concerns.  She reports that she is having financial difficulties.  Reports that she does not want to get better she just wants the things that she needs such as (hearing aid, surgery, pain management & one thicker sole for her shoes).   Suicidal/Homicidal: Nowithout intent/plan  Therapist Response: LCSW provided Patient with ongoing emotional support and encouragement.  Normalized her feelings.  Commended Patient on her progress and reinforced the importance of client staying focused on her own strengths and resources and resiliency. Processed various strategies for dealing with stressors.    Plan: Return again in 4 weeks.  Diagnosis: Axis I: Major Depression, Recurrent severe    Axis II: No diagnosis    Lubertha [place] 08/22/2015

## 2015-08-30 NOTE — Progress Notes (Signed)
Pharmacy notified.

## 2015-09-05 DIAGNOSIS — M5090 Cervical disc disorder, unspecified, unspecified cervical region: Secondary | ICD-10-CM | POA: Insufficient documentation

## 2015-09-05 DIAGNOSIS — G44221 Chronic tension-type headache, intractable: Secondary | ICD-10-CM | POA: Insufficient documentation

## 2015-09-06 ENCOUNTER — Other Ambulatory Visit: Payer: Self-pay | Admitting: Gastroenterology

## 2015-09-06 DIAGNOSIS — R1084 Generalized abdominal pain: Secondary | ICD-10-CM

## 2015-09-06 DIAGNOSIS — R1013 Epigastric pain: Secondary | ICD-10-CM

## 2015-09-13 ENCOUNTER — Ambulatory Visit
Admission: RE | Admit: 2015-09-13 | Discharge: 2015-09-13 | Disposition: A | Discharge status: Home / Self Care | Payer: Medicare Other | Source: Ambulatory Visit | Attending: Gastroenterology | Admitting: Gastroenterology

## 2015-09-13 DIAGNOSIS — R1084 Generalized abdominal pain: Secondary | ICD-10-CM | POA: Diagnosis not present

## 2015-09-13 DIAGNOSIS — R1013 Epigastric pain: Secondary | ICD-10-CM | POA: Insufficient documentation

## 2015-09-13 DIAGNOSIS — D259 Leiomyoma of uterus, unspecified: Secondary | ICD-10-CM | POA: Diagnosis not present

## 2015-09-13 DIAGNOSIS — N289 Disorder of kidney and ureter, unspecified: Secondary | ICD-10-CM | POA: Insufficient documentation

## 2015-09-13 MED ORDER — IOHEXOL 300 MG/ML  SOLN
100.0000 mL | Freq: Once | INTRAMUSCULAR | Status: AC | PRN
  Administered 2015-09-13: 100 mL via INTRAVENOUS

## 2015-09-18 ENCOUNTER — Ambulatory Visit: Payer: Medicare Other | Attending: Neurology

## 2015-09-18 DIAGNOSIS — R531 Weakness: Secondary | ICD-10-CM

## 2015-09-18 DIAGNOSIS — R42 Dizziness and giddiness: Secondary | ICD-10-CM | POA: Diagnosis present

## 2015-09-18 DIAGNOSIS — R262 Difficulty in walking, not elsewhere classified: Secondary | ICD-10-CM | POA: Diagnosis not present

## 2015-09-18 NOTE — Therapy (Signed)
[place] MAIN [facility] SERVICES [address] [place], [place], [address] Phone: [phone]   Fax:  [phone]  Physical Therapy Evaluation  Patient Details  Name: Katie Baker MRN: [id number] Date of Birth: December 08, 1960 Referring Provider: Jennings Books  Encounter Date: 09/18/2015      PT End of Session - 09/18/15 1529    Visit Number 1   Number of Visits 13   Date for PT Re-Evaluation November 22, 2015   Authorization Type g codes every 10th visit   PT Start Time 1345   PT Stop Time 1500   PT Time Calculation (min) 75 min   Equipment Utilized During Treatment Gait belt   Activity Tolerance Patient limited by fatigue;Patient limited by pain   Behavior During Therapy Anxious      Past Medical History  Diagnosis Date  . Thyroid cancer ([place])   . Reflux   . Hypothyroidism   . Hypertension   . Hyperlipidemia   . Obesity   . Dislocation of hip, congenital   . Fibromyalgia   . Sleep apnea   . Prediabetes   . Diabetes mellitus, type II ([place])   . Tendonitis   . Spinal stenosis     Past Surgical History  Procedure Laterality Date  . Colonoscopy with propofol N/A 02/09/2015    Procedure: COLONOSCOPY WITH PROPOFOL;  Surgeon: [name], MD;  Location: [facility] ENDOSCOPY;  Service: Endoscopy;  Laterality: N/A;  . Esophagogastroduodenoscopy N/A 02/09/2015    Procedure: ESOPHAGOGASTRODUODENOSCOPY (EGD);  Surgeon: [name], MD;  Location: [facility] ENDOSCOPY;  Service: Endoscopy;  Laterality: N/A;  . Thyroid surgery    . Hip surgery      There were no vitals filed for this visit.  Visit Diagnosis:  Difficulty walking - Plan: PT plan of care cert/re-cert  Weakness generalized - Plan: PT plan of care cert/re-cert  Dizziness and giddiness - Plan: PT plan of care cert/re-cert      Subjective Assessment - 09/18/15 1517    Subjective Worsening mobility, balance, and vertigo   Patient is accompained by: Family member  Daughter and  grandson   Pertinent History Pt unsure why she was referred for physical therapy. She has a history of multiple chronic conditions as well as worsening mobility, deconditioning, and balance. Pt reports multiple falls/month. 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 She reports 2-3 episodes/wk for >9 years. Pt believes it may be worsening recently. States that the episodes occur spontaneously and are not precipitated by movement. Pt has a history of bilateral THR as well as a R revision in 2005. She fell in 2004 and fractured her R femur resulting in a IM rod to her knee. Pt with a history of tinnitis since at least 2005. She had thyroid cancer with thyroid removal 2011. 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 She has a heel lift built into her L shoe. Pt reports a history of cervical and lumbar stenosis as well. She uses a rollator for household and limited community mobility. She sees Dr. Chancy Milroy at the pain clinic for chronic pain related to her hips as well as her fibromyalgia. 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 Bilateral LE neuropathy as well as chronic sinus infections. Pt reports that she has a history of aquatic therapy but it was too intense and resulted in fatigue and increased pain. Her last bout of therapy was approximately 2 years ago for strengthening and balance.  Pt is very fearful that physical therapy will increase her pain.     Limitations Standing;Walking   Currently in Pain? Yes  Pt with chronic pain, not addressed at this visit. Pt reports 10/10 pain all the time            [facility] PT Assessment - 09/18/15 1519    Assessment   Medical Diagnosis Dizziness   Referring Provider Jennings Books   Onset Date/Surgical Date 09/02/06   Next MD Visit Not reported   Prior Therapy Yes, prior therapy for LE strengthening and balance. Most recently was approximately 2.5 years ago   Precautions   Precautions Fall   Restrictions   Weight  Bearing Restrictions No   Balance Screen   Has the patient fallen in the past 6 months Yes   How many times? Pt reports multiple falls or near falls/month   Has the patient had a decrease in activity level because of a fear of falling?  Yes   [place] residence   Living Arrangements Alone   Available Help at Discharge Family   Type of [place] Access Level entry   [place] Two level   Alternate Level Stairs-Number of Steps [place] - 4 wheels;Cane - single point;Shower seat;Wheelchair - manual   Prior Function   Level of Independence Needs assistance with ADLs;Requires assistive device for independence;Needs assistance with homemaking   Cognition   Overall Cognitive Status Within Functional Limits for tasks assessed   Observation/Other Assessments   Observations Forward head posture and significant forward trunk leaning in standing. Pt with leg length discrepnacy and heel lift on L shoe. Pelvis appears to be asymmetrical.    Other Surveys  Other Surveys   Activities of Balance Confidence Scale (ABC Scale)  3.44%   Dizziness Handicap Inventory (DHI)  96/100 (failed to answer 1 of the questions)   Sensation   Light Touch Not tested   Additional Comments Pt reports LE neuropathy. Full sensation testing deferred   Standardized Balance Assessment   Standardized Balance Assessment Timed Up and Go Test;10 meter walk test   10 Meter Walk 90 seconds = 0.11 m/s   Timed Up and Go Test   TUG Normal TUG   Normal TUG (seconds) 82.8   TUG Comments Heavy UE use for standing and lowering       VESTIBULAR AND BALANCE EVALUATION  Onset Date: >9 years ago, otherwise unable to provide more information  HISTORY:  Subjective history of current problem: Pt unsure why she was referred for physical therapy. She has a history of multiple chronic conditions as well as worsening mobility,  deconditioning, and balance. Pt reports multiple falls/month. 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 She reports 2-3 episodes/wk for >9 years. Pt believes it may be worsening recently. States that the episodes occur spontaneously and are not precipitated by movement. Pt has a history of bilateral THR as well as a R revision in 2005. She fell in 2004 and fractured her R femur resulting in a IM rod to her knee. Pt with a history of tinnitis since at least 2005. She had thyroid cancer with thyroid removal 2011. 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 She has a heel lift built into her L shoe. Pt reports a history of cervical and lumbar stenosis as well. She uses a rollator for household and limited  community mobility. She sees Dr. Chancy Milroy at the pain clinic for chronic pain related to her hips as well as her fibromyalgia. 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 Bilateral LE neuropathy as well as chronic sinus infections. Pt reports that she has a history of aquatic therapy but it was too intense and resulted in fatigue and increased pain. Her last bout of therapy was approximately 2 years ago for strengthening and balance. Pt is very fearful that physical therapy will increase her pain.   Description of dizziness: (vertigo, unsteadiness, lightheadedness, falling, general unsteadiness, aural fullness) Frequency: 2-3 episodes of vertigo/wk Duration: 1-2 minutes Symptom nature: (motion provoked/positional/spontaneous/constant, variable, intermittent): spontaneous, unable to associate with motion.     Provocative Factors: spontaneous onset, not related to movement. Daughter states that it can happen with high pitches Easing Factors: Rest  Progression of symptoms: (better, worse, no change since onset): increased frequency History of similar episodes: Has been going on for multiple years  Falls (yes/no): Yes Number of falls in past 6 months: Pt reports  multiple falls every month  Prior Functional Level: Worsening of mobility for multiple years. Pt ambulates with rolling walker at home and for limited community distances  Auditory complaints (tinnitus, pain, drainage): tinnitis, pain, no drainage Vision (last eye exam, diplopia, recent changes): None  Current Symptoms: vertigo, N & V, dysarthria, dysphagia, recent weight gain, lightheadedness, headaches/migraines, rocking, general unsteadiness, imbalance, oscillopsia, migraines (2-3x/wk)) Review of systems negative for red flags. Denies: bowel/bladder changes, weight loss  EXAMINATION  POSTURE: Forward flexed posture in standing. Pelvic asymmetries with   NEUROLOGICAL SCREEN: (2+ unless otherwise noted.) N=normal  Ab=abnormal  Full neurological screening deferred at this time. Pt referred from neurologist and has multiple comorbid conditions confounding results.   SOMATOSENSORY:   Pt reports chronic bilateral LE neuropathy.        MUSCULOSKELETAL SCREEN: Cervical Spine ROM: Painful in all planes and limited, Significant limitation in cervical extension. Pt does not tolerate passive cervical movement.    ROM: Age-appropriate end range loss of shoulder flexion. Limited bilateral hip flexion secondary to habitus and weakness. Limited ankle dorsiflexion.   MMT: Attempted MMT but pt unable to tolerate testing due to pain with all touch  Functional Mobility: Heavy UE assist with all transfers. Decreased LE strength/power with increased time required to come to standing. CGA necessary to ensure safety with transfers  Gait: Scanning of visual environment with gait is: Poor  Balance: Obvious limitations in balance based on outcome measures and while observing functional mobility. Unable to complete standardized measure on this date.   OCULOMOTOR / VESTIBULAR TESTING:  Oculomotor Exam- Room Light  Normal Abnormal Comments  Ocular Alignment  A L eye appears to be higher than R, Hx of  Bells with R ptosis  Ocular ROM N    Spontaneous Nystagmus N    End-Gaze Nystagmus     Smooth Pursuit  A Saccadic, midrange R horizontal nystagmus with R gaze  Saccades  A Horizontal consistent undershoot but only one correction, Vertical requires multiple saccades to reach target  VOR   Can't tolerate due to pain with attempt  VOR Cancellation   Can't tolerate due to pain with attempt  Left Head Thrust   Deferred  Right Head Thrust   Deferred  Head Shaking Nystagmus   Deferred  Static Acuity   Deferred  Dynamic Acuity   Deferred    Oculomotor Exam- Fixation Suppressed  Normal Abnormal Comments  Ocular Alignment  A  Ocular ROM     Spontaneous Nystagmus  A Very slow and low amplitude R beating horizontal nystagmus at rest, worsens slightly with R gaze and diminishes with L gaze  End-Gaze Nystagmus     Left Head Thrust     Right Head Thrust     Head Shaking Nystagmus       BPPV TESTS:  Symptoms Duration Intensity Nystagmus  L Dix-Hallpike "feel like I'm going to pass out" >1 minute  Negative  R Dix-Hallpike "feel like I'm going to pass out" >1 minute  Negative  L Head Roll      R Head Roll      L Sidelying Test      R Sidelying Test                                             PT Education - 09/18/15 1529    Education provided Yes   Education Details Plan of care, prognosis   Person(s) Educated Patient;Child(ren)   Methods Explanation   Comprehension Verbalized understanding             PT Long Term Goals - 09/18/15 1541    PT LONG TERM GOAL #1   Title Pt will be independent with HEP for strengthening/balance in order to improve mobility and decrease fall risk   Time 6   Period Weeks   Status New   PT LONG TERM GOAL #2   Title Pt will decrease TUG by at least 10 seconds in order to demonstrate decreased fall risk and improved function   Baseline 09/18/15: 82.8 seconds   Time 6   Period Weeks   Status New   PT LONG TERM GOAL  #3   Title Pt will improve ABC score by at least 13% in order to demonstrate improved confidence in balance   Baseline 09/18/15: 3.44%   Time 6   Period Weeks   Status New               Plan - 09/18/15 1530    Clinical Impression Statement Pt was referred to PT for dizziness but presents with multiple compaints related to weakness, balance, and vertigo. She has multiple comorbid conditions with a complex and storied medical history. 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 With respect to the very limited vestibular examination that pt tolerated she is negative for vestibular deficits. Her exam is much more indicative of central entiologies for her vertigo. 10 meter walk distance takes 1 minute and 30 seconds (0.53m/s) and TUG is 1 minutes 22.8 seconds which are both far below norms for her age. Her score on the Activity Balance Confidence scale is 3.44%. Pt is a high fall risk. Unclear if pt will tolerate PT given her reports of limited ability to tolerate increased activity due to chronic pain and fibromyalgia. She would benefit from attempted PT for balance, strength, and mobility to improve function and decrease fall risk. She may benefit from attempts at aquatic therapy however reports that in the past aquatic therapy was "too intense." Her overall prognosis is poor due to both physical limitations and psychosocial factors.   Pt will benefit from skilled therapeutic  intervention in order to improve on the following deficits Abnormal gait;Decreased activity tolerance;Decreased balance;Decreased mobility;Decreased safety awareness;Decreased strength;Difficulty walking;Dizziness;Impaired perceived functional ability;Obesity;Pain   Rehab Potential Poor   Clinical  Impairments Affecting Rehab Potential Positive: motivation, Negative: chronicity, comorbidities, chronic pain, psychosocial factors   PT Frequency 2x / week   PT Duration 6 weeks   PT Treatment/Interventions ADLs/Self Care Home Management;Aquatic Therapy;DME Instruction;Gait training;Stair training;Functional mobility training;Therapeutic activities;Therapeutic exercise;Balance training;Neuromuscular re-education;Cognitive remediation;Patient/family education;Manual techniques;Energy conservation;Vestibular   PT Next Visit Plan Balance outcome measure: BERG? single leg balance?, strengthening, balance, initiate HEP   PT Home Exercise Plan None currently   Consulted and Agree with Plan of Care Patient          G-Codes - 09/26/15 1546    Functional Assessment Tool Used clinical judgement, 43m gait speed, TUG, ABC, DHI   Functional Limitation Mobility: Walking and moving around   Mobility: Walking and Moving Around Current Status [phone]) At least 80 percent but less than 100 percent impaired, limited or restricted   Mobility: Walking and Moving Around Goal Status [phone]) At least 80 percent but less than 100 percent impaired, limited or restricted       Problem List Patient Active Problem List   Diagnosis Date Noted  . [facility] (congenital dislocation of the hip) 06/19/2015  . HLD (hyperlipidemia) 06/19/2015  . BP (high blood pressure) 06/19/2015  . Adiposity 06/19/2015  . Borderline diabetes mellitus 06/19/2015  . Obstructive apnea 06/19/2015  . H/O malignant neoplasm of thyroid 07/15/2014  . Hypothyroidism, postop 07/15/2014  . Degeneration of intervertebral disc of lumbar region 03/18/2013  . Fibromyalgia 03/18/2013  . LBP (low back pain) 03/18/2013    Arbadella Kimbler 2015/09/26, 3:59 PM  [place] MAIN [facility] SERVICES [address] [place], [place], [address] Phone: [phone]   Fax:  [phone]  Name: DENESSA HUMPHRES MRN: [id number] Date  of Birth: 16-May-1961

## 2015-09-18 NOTE — Patient Instructions (Signed)
No HEP furnished on this date

## 2015-09-27 ENCOUNTER — Ambulatory Visit: Payer: Medicare Other | Admitting: Physical Therapy

## 2015-10-02 ENCOUNTER — Ambulatory Visit: Payer: Medicare Other | Admitting: Physical Therapy

## 2015-10-04 ENCOUNTER — Ambulatory Visit: Payer: Medicare Other | Attending: Neurology | Admitting: Physical Therapy

## 2015-10-04 DIAGNOSIS — R262 Difficulty in walking, not elsewhere classified: Secondary | ICD-10-CM | POA: Insufficient documentation

## 2015-10-04 DIAGNOSIS — R42 Dizziness and giddiness: Secondary | ICD-10-CM | POA: Insufficient documentation

## 2015-10-04 DIAGNOSIS — R531 Weakness: Secondary | ICD-10-CM | POA: Insufficient documentation

## 2015-10-09 ENCOUNTER — Ambulatory Visit: Payer: Medicare Other | Admitting: Physical Therapy

## 2015-10-11 ENCOUNTER — Ambulatory Visit: Payer: Medicare Other | Admitting: Physical Therapy

## 2015-10-11 ENCOUNTER — Encounter: Payer: Self-pay | Admitting: Physical Therapy

## 2015-10-11 DIAGNOSIS — R262 Difficulty in walking, not elsewhere classified: Secondary | ICD-10-CM

## 2015-10-11 DIAGNOSIS — R531 Weakness: Secondary | ICD-10-CM | POA: Diagnosis present

## 2015-10-11 DIAGNOSIS — R42 Dizziness and giddiness: Secondary | ICD-10-CM | POA: Diagnosis present

## 2015-10-11 NOTE — Therapy (Signed)
[place] MAIN [facility] SERVICES [address] [place], [place], [address] Phone: [phone]   Fax:  [phone]  Physical Therapy Treatment  Patient Details  Name: Katie Baker MRN: [id number] Date of Birth: 1961/04/03 Referring Provider: Jennings Books  Encounter Date: 10/11/2015      PT End of Session - 10/11/15 1358    Visit Number 2   Number of Visits 13   Date for PT Re-Evaluation 11-04-15   Authorization Type g codes every 10th visit   PT Start Time 1300   PT Stop Time 1350   PT Time Calculation (min) 50 min   Equipment Utilized During Treatment Gait belt   Activity Tolerance Patient limited by fatigue;Patient limited by pain   Behavior During Therapy Anxious      Past Medical History  Diagnosis Date  . Thyroid cancer ([place])   . Reflux   . Hypothyroidism   . Hypertension   . Hyperlipidemia   . Obesity   . Dislocation of hip, congenital   . Fibromyalgia   . Sleep apnea   . Prediabetes   . Diabetes mellitus, type II ([place])   . Tendonitis   . Spinal stenosis     Past Surgical History  Procedure Laterality Date  . Colonoscopy with propofol N/A 02/09/2015    Procedure: COLONOSCOPY WITH PROPOFOL;  Surgeon: [name], MD;  Location: [facility] ENDOSCOPY;  Service: Endoscopy;  Laterality: N/A;  . Esophagogastroduodenoscopy N/A 02/09/2015    Procedure: ESOPHAGOGASTRODUODENOSCOPY (EGD);  Surgeon: [name], MD;  Location: [facility] ENDOSCOPY;  Service: Endoscopy;  Laterality: N/A;  . Thyroid surgery    . Hip surgery      There were no vitals filed for this visit.  Visit Diagnosis:  Difficulty walking  Weakness generalized  Dizziness and giddiness      Subjective Assessment - 10/11/15 1357    Subjective Patient reports still having episodes of vertigo, approximately 3-4x since last visit 2 weeks ago; Reports increased pelvic pain and wanting to see pelvic specialist to reduce pain with bowel/bladder management;     Patient is accompained by: Family member  Daughter and grandson   Pertinent History Pt unsure why she was referred for physical therapy. She has a history of multiple chronic conditions as well as worsening mobility, deconditioning, and balance. Pt reports multiple falls/month. 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 She reports 2-3 episodes/wk for >9 years. Pt believes it may be worsening recently. States that the episodes occur spontaneously and are not precipitated by movement. Pt has a history of bilateral THR as well as a R revision in 2005. She fell in 2004 and fractured her R femur resulting in a IM rod to her knee. Pt with a history of tinnitis since at least 2005. She had thyroid cancer with thyroid removal 2011. 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 She has a heel lift built into her L shoe. Pt reports a history of cervical and lumbar stenosis as well. She uses a rollator for household and limited community mobility. She sees Dr. Chancy Milroy at the pain clinic for chronic pain related to her hips as well as her fibromyalgia. 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 Bilateral LE neuropathy as well as chronic sinus infections. Pt reports that she has a history of aquatic therapy but it was too intense and resulted in fatigue and increased pain. Her last bout of therapy was approximately 2  years ago for strengthening and balance. Pt is very fearful that physical therapy will increase her pain.     Limitations Standing;Walking   Currently in Pain? No/denies  no pain at rest        TREATMENT: Warm up on Nustep level 2 BUE/BLE x5 min (unbilled);  PT educated patient in plan of care and recommendations. Patient did express desire to work with pelvic health specialist. We will continue with outpatient PT until patient able to see pelvic specialist.  Instructed patient in HEP: Side stepping outside parallel bars x10 feet x2 laps each  direction; Forward/backward gait with 1 HHA on bars and one SPC 10 feet x2 laps each;  Tandem stance with 2 HHA progressing to 1-0 HHA 10 sec hold x3 bilaterally;  Patient required min VCs for balance stability, including to increase trunk control for less loss of balance with smaller base of support Patient required mod VCs to improve foot placement for better balance control with dynamic stepping;                          PT Education - 10/11/15 1358    Education provided Yes   Education Details HEP   Person(s) Educated Patient;Child(ren)   Methods Demonstration;Explanation;Verbal cues;Handout   Comprehension Verbalized understanding;Returned demonstration;Verbal cues required             PT Long Term Goals - 09/18/15 1541    PT LONG TERM GOAL #1   Title Pt will be independent with HEP for strengthening/balance in order to improve mobility and decrease fall risk   Time 6   Period Weeks   Status New   PT LONG TERM GOAL #2   Title Pt will decrease TUG by at least 10 seconds in order to demonstrate decreased fall risk and improved function   Baseline 09/18/15: 82.8 seconds   Time 6   Period Weeks   Status New   PT LONG TERM GOAL #3   Title Pt will improve ABC score by at least 13% in order to demonstrate improved confidence in balance   Baseline 09/18/15: 3.44%   Time 6   Period Weeks   Status New               Plan - 10/11/15 1358    Clinical Impression Statement Patient hesitant to try new exercise due to history of hip/knee surgeries and fear of hurting or injuring self. Instructed patient in standing balance exercise. Patient required mod VCs for correct positioning for better challenge. She reports increased back/sacral pain with prolonged standing. Patient very slow with movement and requires frequent rest breaks. She would benefit from additional skilled PT intervention to improve balance/gait safety. Patient will continue working on balance  until visit with pelvic specialist.    Pt will benefit from skilled therapeutic intervention in order to improve on the following deficits Abnormal gait;Decreased activity tolerance;Decreased balance;Decreased mobility;Decreased safety awareness;Decreased strength;Difficulty walking;Dizziness;Impaired perceived functional ability;Obesity;Pain   Rehab Potential Poor   Clinical Impairments Affecting Rehab Potential Positive: motivation, Negative: chronicity, comorbidities, chronic pain, psychosocial factors   PT Frequency 2x / week   PT Duration 6 weeks   PT Treatment/Interventions ADLs/Self Care Home Management;Aquatic Therapy;DME Instruction;Gait training;Stair training;Functional mobility training;Therapeutic activities;Therapeutic exercise;Balance training;Neuromuscular re-education;Cognitive remediation;Patient/family education;Manual techniques;Energy conservation;Vestibular   PT Next Visit Plan Balance outcome measure: BERG? single leg balance?, strengthening, balance, initiate HEP   PT Home Exercise Plan None currently   Consulted and Agree with Plan of  Care Patient        Problem List Patient Active Problem List   Diagnosis Date Noted  . [facility] (congenital dislocation of the hip) 06/19/2015  . HLD (hyperlipidemia) 06/19/2015  . BP (high blood pressure) 06/19/2015  . Adiposity 06/19/2015  . Borderline diabetes mellitus 06/19/2015  . Obstructive apnea 06/19/2015  . H/O malignant neoplasm of thyroid 07/15/2014  . Hypothyroidism, postop 07/15/2014  . Degeneration of intervertebral disc of lumbar region 03/18/2013  . Fibromyalgia 03/18/2013  . LBP (low back pain) 03/18/2013    Natale Barba PT, DPT 10/11/2015, 2:02 PM  [place] MAIN [facility] SERVICES [address] [place], [place], [address] Phone: [phone]   Fax:  [phone]  Name: REBIE LAVOY MRN: [id number] Date of Birth: [birth year]

## 2015-10-16 ENCOUNTER — Ambulatory Visit: Payer: Medicare Other | Admitting: Physical Therapy

## 2015-10-18 ENCOUNTER — Encounter: Payer: Self-pay | Admitting: Physical Therapy

## 2015-10-18 ENCOUNTER — Ambulatory Visit: Payer: Medicare Other | Admitting: Physical Therapy

## 2015-10-18 DIAGNOSIS — R42 Dizziness and giddiness: Secondary | ICD-10-CM

## 2015-10-18 DIAGNOSIS — R531 Weakness: Secondary | ICD-10-CM

## 2015-10-18 DIAGNOSIS — R262 Difficulty in walking, not elsewhere classified: Secondary | ICD-10-CM

## 2015-10-18 NOTE — Patient Instructions (Addendum)
Copyright  VHI. All rights reserved.  Marching in Place: Ball Throw Hand to Hand   Standing in corner with walker in front for safety;  Standing in one place, pass ball from hand to hand. Track ball movement with head and eyes. Repeat _5-_10__ times per session. Do __1__ sessions per day.    Balance: Eyes Closed - Bilateral (Varied Surfaces)    Stand, feet shoulder width, Start with eyes open, hold for 10 sec, then close eyes and try to hold for 10 sec; Keep walker in front for safety; Repeat 5 times eyes open/closed;   Copyright  VHI. All rights reserved.  Balance: Neck Rotation, Eyes Closed - Bilateral (Varied Surfaces)    Stand, feet shoulder width, in corner with walker in front, for safety, eyes open. Turn head side/side slowly within pain free range of motion. Repeat _5-10___ times per set. Do __1__ sets per session. Do __5__ sessions per week.   Copyright  VHI. All rights reserved.  Feet Apart (Compliant Surface) Head Motion - Eyes Closed    Standing in corner with walker in front for safety with feet shoulder width apart. Keep eyes open and move head slowly, up and down. Repeat __5-10__ times per session. Do _2___ sessions per day.  Copyright  VHI. All rights reserved.

## 2015-10-18 NOTE — Therapy (Signed)
[place] MAIN [facility] SERVICES [address] [place], [place], [address] Phone: [phone]   Fax:  [phone]  Physical Therapy Treatment  Patient Details  Name: Katie Baker MRN: [id number] Date of Birth: August 04, 1961 Referring Provider: Jennings Books  Encounter Date: 10/18/2015      PT End of Session - 10/18/15 1355    Visit Number 3   Number of Visits 13   Date for PT Re-Evaluation 11/19/2015   Authorization Type g codes every 10th visit   PT Start Time 1302   PT Stop Time 1345   PT Time Calculation (min) 43 min   Equipment Utilized During Treatment Gait belt   Activity Tolerance Patient limited by fatigue;Patient limited by pain   Behavior During Therapy [facility] for tasks assessed/performed      Past Medical History  Diagnosis Date  . Thyroid cancer ([place])   . Reflux   . Hypothyroidism   . Hypertension   . Hyperlipidemia   . Obesity   . Dislocation of hip, congenital   . Fibromyalgia   . Sleep apnea   . Prediabetes   . Diabetes mellitus, type II ([place])   . Tendonitis   . Spinal stenosis     Past Surgical History  Procedure Laterality Date  . Colonoscopy with propofol N/A 02/09/2015    Procedure: COLONOSCOPY WITH PROPOFOL;  Surgeon: [name], MD;  Location: [facility] ENDOSCOPY;  Service: Endoscopy;  Laterality: N/A;  . Esophagogastroduodenoscopy N/A 02/09/2015    Procedure: ESOPHAGOGASTRODUODENOSCOPY (EGD);  Surgeon: [name], MD;  Location: [facility] ENDOSCOPY;  Service: Endoscopy;  Laterality: N/A;  . Thyroid surgery    . Hip surgery      There were no vitals filed for this visit.  Visit Diagnosis:  Difficulty walking  Weakness generalized  Dizziness and giddiness      Subjective Assessment - 10/18/15 1313    Subjective Patient reports trying some of the exercises from last session as best as she could. She reports increased back pain today; Patient denies any new falls; She reports having more dizziness  over last few days with almost falls;    Patient is accompained by: Family member  Daughter and grandson   Pertinent History Pt unsure why she was referred for physical therapy. She has a history of multiple chronic conditions as well as worsening mobility, deconditioning, and balance. Pt reports multiple falls/month. 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 She reports 2-3 episodes/wk for >9 years. Pt believes it may be worsening recently. States that the episodes occur spontaneously and are not precipitated by movement. Pt has a history of bilateral THR as well as a R revision in 2005. She fell in 2004 and fractured her R femur resulting in a IM rod to her knee. Pt with a history of tinnitis since at least 2005. She had thyroid cancer with thyroid removal 2011. 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 She has a heel lift built into her L shoe. Pt reports a history of cervical and lumbar stenosis as well. She uses a rollator for household and limited community mobility. She sees Dr. Chancy Milroy at the pain clinic for chronic pain related to her hips as well as her fibromyalgia. 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 Bilateral LE neuropathy as well as chronic sinus infections. Pt reports that she has a history of aquatic therapy but it was too intense and resulted in fatigue and  increased pain. Her last bout of therapy was approximately 2 years ago for strengthening and balance. Pt is very fearful that physical therapy will increase her pain.     Limitations Standing;Walking   Currently in Pain? Yes   Pain Score 9    Pain Location Back   Pain Orientation Lower   Pain Descriptors / Indicators Aching;Sore   Pain Type Chronic pain         Warm up on Nustep BUE/BLE level 2 x5 min (unbilled) concurrent with moist heat to low back;  PT instructed patient in advanced balance exercise: Standing in corner with chair in front: Feet close together with head turns  up/down, side/side x5 reps each with cues to increase gaze stabilization for less loss of balance; Feet apart head forward, eyes open/closed 10 sec hold x3 each;  PT educated patient in seated unsupported eyes open/closed 10 sec hold x3 each with cues to focus on stability of chair and floor for better sensory integration; She was more unsteady with eyes closed in standing but that could be related to poor posture from back pain; Patient unable to stand with fully erect posture;  Advanced HEP See patient instructions; Also educated patient in standing with ball pass side/side for increased challenge with unsupported standing.  Patient required min VCs for balance stability, including to increase trunk control for less loss of balance with smaller base of support                               PT Education - 10/18/15 1354    Education provided Yes   Education Details HEP, balance exercise   Person(s) Educated Patient   Methods Explanation;Verbal cues;Demonstration;Handout   Comprehension Verbalized understanding;Returned demonstration;Verbal cues required             PT Long Term Goals - 09/18/15 1541    PT LONG TERM GOAL #1   Title Pt will be independent with HEP for strengthening/balance in order to improve mobility and decrease fall risk   Time 6   Period Weeks   Status New   PT LONG TERM GOAL #2   Title Pt will decrease TUG by at least 10 seconds in order to demonstrate decreased fall risk and improved function   Baseline 09/18/15: 82.8 seconds   Time 6   Period Weeks   Status New   PT LONG TERM GOAL #3   Title Pt will improve ABC score by at least 13% in order to demonstrate improved confidence in balance   Baseline 09/18/15: 3.44%   Time 6   Period Weeks   Status New               Plan - 10/18/15 1355    Clinical Impression Statement Instructed patient in balance exercise. She required increased cues for correct posture to improve  balance with unsupported standing. Patient had increased discomfort in back with standing exercise. Patient would benefit from additional skilled PT Intervention to improve balance and gait safety and reduce fall risk;    Pt will benefit from skilled therapeutic intervention in order to improve on the following deficits Abnormal gait;Decreased activity tolerance;Decreased balance;Decreased mobility;Decreased safety awareness;Decreased strength;Difficulty walking;Dizziness;Impaired perceived functional ability;Obesity;Pain   Rehab Potential Poor   Clinical Impairments Affecting Rehab Potential Positive: motivation, Negative: chronicity, comorbidities, chronic pain, psychosocial factors   PT Frequency 2x / week   PT Duration 6 weeks   PT Treatment/Interventions ADLs/Self Care Home  Management;Aquatic Therapy;DME Instruction;Gait training;Stair training;Functional mobility training;Therapeutic activities;Therapeutic exercise;Balance training;Neuromuscular re-education;Cognitive remediation;Patient/family education;Manual techniques;Energy conservation;Vestibular   PT Next Visit Plan Balance outcome measure: BERG? single leg balance?, strengthening, balance, initiate HEP   PT Home Exercise Plan advanced- see patient instructions   Consulted and Agree with Plan of Care Patient        Problem List Patient Active Problem List   Diagnosis Date Noted  . [facility] (congenital dislocation of the hip) 06/19/2015  . HLD (hyperlipidemia) 06/19/2015  . BP (high blood pressure) 06/19/2015  . Adiposity 06/19/2015  . Borderline diabetes mellitus 06/19/2015  . Obstructive apnea 06/19/2015  . H/O malignant neoplasm of thyroid 07/15/2014  . Hypothyroidism, postop 07/15/2014  . Degeneration of intervertebral disc of lumbar region 03/18/2013  . Fibromyalgia 03/18/2013  . LBP (low back pain) 03/18/2013     Makailyn Mccormick PT, DPT 10/18/2015, 1:57 PM  [place] MAIN [facility]  SERVICES [address] [place], [place], [address] Phone: [phone]   Fax:  [phone]  Name: Katie Baker MRN: [id number] Date of Birth: 1961/07/09

## 2015-10-20 ENCOUNTER — Other Ambulatory Visit: Payer: Self-pay | Admitting: Internal Medicine

## 2015-10-20 DIAGNOSIS — Z1231 Encounter for screening mammogram for malignant neoplasm of breast: Secondary | ICD-10-CM

## 2015-10-23 ENCOUNTER — Ambulatory Visit: Payer: Medicare Other | Admitting: Physical Therapy

## 2015-10-24 ENCOUNTER — Ambulatory Visit: Payer: Medicare Other

## 2015-10-24 ENCOUNTER — Encounter: Payer: Self-pay | Admitting: Physical Therapy

## 2015-10-24 DIAGNOSIS — R262 Difficulty in walking, not elsewhere classified: Secondary | ICD-10-CM

## 2015-10-24 DIAGNOSIS — R42 Dizziness and giddiness: Secondary | ICD-10-CM

## 2015-10-24 DIAGNOSIS — R531 Weakness: Secondary | ICD-10-CM

## 2015-10-24 NOTE — Therapy (Signed)
[place] MAIN [facility] SERVICES [address] [place], [place], [address] Phone: [phone]   Fax:  [phone]  October 24, 2015   @[email or web address] @  Physical Therapy Discharge Summary  Patient: Katie Baker  MRN: [id number]  Date of Birth: 1961-02-11   Diagnosis:  Difficulty walking  Weakness generalized  Dizziness and giddiness Referring Provider: Jennings Books  The above patient had been seen in Physical Therapy 3 times of 8 treatments scheduled with 0 no shows and 5 cancellations.  The treatment consisted of balance exercise, HEP, patient education/instruction The patient is: Unchanged   Functional Status at Discharge: Patient cancelled last appointment due to doctor's visit. She was unable to come in for objective measures. Patient has a PT eval for pelvic pain and will be discharged currently for this condition so that she can see a pelvic pain specialist. She does have a HEP for balance exercise.   Goals Partially Met        PT Long Term Goals - 09/18/15 1541    PT LONG TERM GOAL #1   Title Pt will be independent with HEP for strengthening/balance in order to improve mobility and decrease fall risk   Time 6   Period Weeks   Status New   PT LONG TERM GOAL #2   Title Pt will decrease TUG by at least 10 seconds in order to demonstrate decreased fall risk and improved function   Baseline 09/18/15: 82.8 seconds   Time 6   Period Weeks   Status New   PT LONG TERM GOAL #3   Title Pt will improve ABC score by at least 13% in order to demonstrate improved confidence in balance   Baseline 09/18/15: 3.44%   Time 6   Period Weeks   Status New       Sincerely,   Nathanal Hermiz, PT, DPT   CC @[email or web address] @  [place], [place], [address] Phone: [phone]   Fax:  [phone]  Patient: ALINDA EGOLF  MRN: [id number]  Date of Birth:  1960/12/23

## 2015-10-25 ENCOUNTER — Ambulatory Visit: Payer: Medicare Other | Admitting: Physical Therapy

## 2015-10-26 ENCOUNTER — Ambulatory Visit: Payer: Medicare Other | Attending: Gastroenterology | Admitting: Physical Therapy

## 2015-10-26 DIAGNOSIS — R1030 Lower abdominal pain, unspecified: Secondary | ICD-10-CM | POA: Diagnosis present

## 2015-10-26 DIAGNOSIS — M109 Gout, unspecified: Secondary | ICD-10-CM | POA: Insufficient documentation

## 2015-10-26 DIAGNOSIS — M629 Disorder of muscle, unspecified: Secondary | ICD-10-CM

## 2015-10-26 DIAGNOSIS — R262 Difficulty in walking, not elsewhere classified: Secondary | ICD-10-CM | POA: Diagnosis present

## 2015-10-26 DIAGNOSIS — M25559 Pain in unspecified hip: Secondary | ICD-10-CM | POA: Insufficient documentation

## 2015-10-26 DIAGNOSIS — N8184 Pelvic muscle wasting: Secondary | ICD-10-CM | POA: Insufficient documentation

## 2015-10-26 DIAGNOSIS — M6289 Other specified disorders of muscle: Secondary | ICD-10-CM

## 2015-10-26 NOTE — Patient Instructions (Addendum)
   1. [facility] resource on Gout  2. Scar massage Handout (C -section)  2-3 x /day with 5 min each time   3. "X" stretch"  10x each side, 3x day  R hand on opposite knee stretching to R with head turn   10 x   Then L side

## 2015-10-27 NOTE — Therapy (Addendum)
[place] MAIN [facility] SERVICES [address] [place], [place], [address] Phone: [phone]   Fax:  [phone]  Physical Therapy Evaluation  Patient Details  Name: Katie Baker MRN: [id number] Date of Birth: April 22, 1961 Referring Provider: Arther Dames MD  Encounter Date: 10/26/2015      PT End of Session - 11/07/15 2211    Visit Number 1   Date for PT Re-Evaluation 02-10-2016   Authorization Type g codes every 10th visit   PT Start Time 1100   PT Stop Time 1215   PT Time Calculation (min) 75 min   Equipment Utilized During Treatment --   Activity Tolerance Patient tolerated treatment well   Behavior During Therapy [facility] for tasks assessed/performed      Past Medical History  Diagnosis Date  . Thyroid cancer ([place])   . Reflux   . Hypothyroidism   . Hypertension   . Hyperlipidemia   . Obesity   . Dislocation of hip, congenital   . Fibromyalgia   . Sleep apnea   . Prediabetes   . Diabetes mellitus, type II ([place])   . Tendonitis   . Spinal stenosis     Past Surgical History  Procedure Laterality Date  . Colonoscopy with propofol N/A 02/09/2015    Procedure: COLONOSCOPY WITH PROPOFOL;  Surgeon: [name], MD;  Location: [facility] ENDOSCOPY;  Service: Endoscopy;  Laterality: N/A;  . Esophagogastroduodenoscopy N/A 02/09/2015    Procedure: ESOPHAGOGASTRODUODENOSCOPY (EGD);  Surgeon: [name], MD;  Location: [facility] ENDOSCOPY;  Service: Endoscopy;  Laterality: N/A;  . Thyroid surgery    . Hip surgery      There were no vitals filed for this visit.  Visit Diagnosis:  Fascial defect  Pelvic floor dysfunction  Lower abdominal pain  Pain in joint, pelvic region and thigh, unspecified laterality      Subjective Assessment - 11/07/15 0919    Subjective Pt reports she has been practicing self -massage on scar but she finds the R side of the scar to feel sharp and unbearable to massage. Pt states she had one pelvic mm  spasm episode that was debilitating. Pt was tearful when she expressed not being able to reach around her body to clean herself after toileting.  Pt  also reported she would like to get access to loftstrand crutches.Her gout continues impact her walking.    Patient is accompained by: Family member  Daughter    Pertinent History Pt reports she currently has a gout episode in her LLE. 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  LLE leg discrepancy on L (wears a shoe lift). R LE has had more surgeries.   In 2016, benign polyps were removed upon + findings on colonscopy.    Limitations Standing;Walking            [facility] PT Assessment - 11/07/15 2115    Assessment   Medical Diagnosis Pelvic pain    Referring Provider Arther Dames MD   Precautions   Precautions None   Restrictions   Weight Bearing Restrictions No   Balance Screen   Has the patient fallen in the past 6 months No   Observation/Other Assessments   Observations limited cervical ROM, seated in [facility] w/ distressed demeanor     Other Surveys  --  [place] 98%, NIH-CPSI Female 56 pts   Palpation   Palpation comment severely restricted scars lower abdominal area, R hip, lateral LE , tenderness/ increased pelvic floor mm tensions  [place] Adult PT Treatment/Exercise - 11/07/15 2154    Self-Care   Self-Care --  POC, anatomy, physiology, goals, biopsychological approaches   Exercises   Exercises --  D2 extension seated for increased ROM   Manual Therapy   Manual therapy comments scar massage and educated pt for self massage                 PT Education - 11/07/15 2111    Education provided Yes   Education Details HEP   Person(s) Educated Patient;Child(ren)   Methods Explanation;Demonstration;Tactile cues;Verbal cues;Handout   Comprehension Returned demonstration;Verbalized understanding             PT Long Term Goals - 11/07/15 2219    PT LONG TERM GOAL #1   Title Pt will  demo decreased restrictions at lower abdominal scar and R LE in order to decrease pain and tolerate R sidelying for sleeping.    Time 12   Period Weeks   Status New   PT LONG TERM GOAL #2   Title Pt will report being able to continue shopping despite pelvic mm spasm in order to perform ADLs.   Time 12   Period Weeks   Status New   PT LONG TERM GOAL #3   Title Pt will demo no tenderness to palpation to pelvic floor mm in order to demonstrate increased IND in managing pelvic pain for improved QOL.   Time 12   Period Weeks   Status New   PT LONG TERM GOAL #4   Title Pt will decrease her NIH-CPSI Female score from 41 pts to < 31 pts in order to improve pelvic floor function.   Time 12   Period Weeks   Status New               Plan - 11/07/15 2212    Clinical Impression Statement Pt is a 55 yo who complains of pelvic pain and mm spasms that interfere with her ability to participate in community events and ADLs. Pt's clinical presentations include anxiety, frustration related to pain and disability, severely restricted abdominal and hip/LE scars, limited spinal mobility, increased tenderness / tensions at pelvic floor mm,  and  gait limitations / LE length disprepancy 2/2 congential hip dysplasia. Pt's personal factors include gout and multiple medical conditions. Pt reported feeling decreased pain at low abdominal area post-Tx today.      Pt will benefit from skilled therapeutic intervention in order to improve on the following deficits Abnormal gait;Decreased activity tolerance;Decreased balance;Decreased mobility;Decreased safety awareness;Decreased strength;Difficulty walking;Dizziness;Impaired perceived functional ability;Obesity;Pain;Decreased coordination;Decreased range of motion;Increased muscle spasms;Postural dysfunction;Impaired tone;Decreased scar mobility;Improper body mechanics;Impaired sensation;Increased fascial restricitons;Decreased endurance   Rehab Potential Poor    Clinical Impairments Affecting Rehab Potential Positive: motivation, Negative: chronicity, comorbidities, chronic pain, psychosocial factors   PT Frequency 2x / week   PT Duration 12 weeks   PT Treatment/Interventions ADLs/Self Care Home Management;Aquatic Therapy;DME Instruction;Gait training;Stair training;Functional mobility training;Therapeutic activities;Therapeutic exercise;Balance training;Neuromuscular re-education;Cognitive remediation;Patient/family education;Manual techniques;Energy conservation;Vestibular;Cryotherapy;Electrical Stimulation;Biofeedback;Moist Heat;Taping;Scar mobilization;Passive range of motion   PT Next Visit Plan Balance outcome measure: BERG? single leg balance?, strengthening, balance, initiate HEP   PT Home Exercise Plan --   Consulted and Agree with Plan of Care Patient         Problem List Patient Active Problem List   Diagnosis Date Noted  . Gout 10/26/2015  . [facility] (congenital dislocation of the hip) 06/19/2015  . HLD (hyperlipidemia) 06/19/2015  . BP (high blood pressure) 06/19/2015  . Adiposity  06/19/2015  . Borderline diabetes mellitus 06/19/2015  . Obstructive apnea 06/19/2015  . H/O malignant neoplasm of thyroid 07/15/2014  . Hypothyroidism, postop 07/15/2014  . Degeneration of intervertebral disc of lumbar region 03/18/2013  . Fibromyalgia 03/18/2013  . LBP (low back pain) 03/18/2013    Jerl Mina ,PT, DPT, E-RYT  11/07/2015, 10:43 PM  [place] MAIN [facility] SERVICES [address] [place], [place], [address] Phone: [phone]   Fax:  [phone]  Name: Katie Baker MRN: [id number] Date of Birth: 1961-07-15

## 2015-10-30 ENCOUNTER — Ambulatory Visit: Payer: Medicare Other | Admitting: Physical Therapy

## 2015-11-01 ENCOUNTER — Ambulatory Visit
Admission: RE | Admit: 2015-11-01 | Discharge: 2015-11-01 | Disposition: A | Discharge status: Home / Self Care | Payer: Medicare Other | Source: Ambulatory Visit | Attending: Internal Medicine | Admitting: Internal Medicine

## 2015-11-01 ENCOUNTER — Ambulatory Visit: Payer: Medicare Other | Admitting: Physical Therapy

## 2015-11-01 ENCOUNTER — Other Ambulatory Visit: Payer: Self-pay | Admitting: Internal Medicine

## 2015-11-01 DIAGNOSIS — Z1231 Encounter for screening mammogram for malignant neoplasm of breast: Secondary | ICD-10-CM

## 2015-11-06 ENCOUNTER — Ambulatory Visit: Payer: Medicare Other | Attending: Neurology | Admitting: Physical Therapy

## 2015-11-06 DIAGNOSIS — N8184 Pelvic muscle wasting: Secondary | ICD-10-CM | POA: Insufficient documentation

## 2015-11-06 DIAGNOSIS — M6289 Other specified disorders of muscle: Secondary | ICD-10-CM

## 2015-11-06 DIAGNOSIS — R460 Very low level of personal hygiene: Secondary | ICD-10-CM | POA: Diagnosis present

## 2015-11-06 DIAGNOSIS — M6281 Muscle weakness (generalized): Secondary | ICD-10-CM | POA: Insufficient documentation

## 2015-11-06 DIAGNOSIS — M629 Disorder of muscle, unspecified: Secondary | ICD-10-CM | POA: Diagnosis present

## 2015-11-06 DIAGNOSIS — R279 Unspecified lack of coordination: Secondary | ICD-10-CM | POA: Insufficient documentation

## 2015-11-06 DIAGNOSIS — R1030 Lower abdominal pain, unspecified: Secondary | ICD-10-CM | POA: Diagnosis present

## 2015-11-06 DIAGNOSIS — M25559 Pain in unspecified hip: Secondary | ICD-10-CM

## 2015-11-06 DIAGNOSIS — R531 Weakness: Secondary | ICD-10-CM | POA: Diagnosis present

## 2015-11-06 NOTE — Patient Instructions (Addendum)
  Stretching Routine  3 x day   Open book (handout ) 15 reps each side, seated or sidelying   Gentle range of motion exercises while laying on your back (5reps each joint) (See handout)    ______________   Breathing exercise for decreasing pelvic floor muscle tensions and pain  (reverse kegel handout)

## 2015-11-07 NOTE — Therapy (Addendum)
[place] MAIN [facility] SERVICES [address] [place], [place], [address] Phone: [phone]   Fax:  [phone]  Physical Therapy Treatment  Patient Details  Name: Katie Baker MRN: [id number] Date of Birth: May 03, 1961 Referring Provider: Arther Dames MD  Encounter Date: 11/06/2015      PT End of Session - 11/07/15 2256    Visit Number 2   Date for PT Re-Evaluation Feb 24, 2016   Authorization Type g codes every 10th visit   PT Start Time [id number]   PT Stop Time 1410   PT Time Calculation (min) 65 min   Activity Tolerance Patient tolerated treatment well   Behavior During Therapy [facility] for tasks assessed/performed      Past Medical History  Diagnosis Date  . Thyroid cancer ([place])   . Reflux   . Hypothyroidism   . Hypertension   . Hyperlipidemia   . Obesity   . Dislocation of hip, congenital   . Fibromyalgia   . Sleep apnea   . Prediabetes   . Diabetes mellitus, type II ([place])   . Tendonitis   . Spinal stenosis     Past Surgical History  Procedure Laterality Date  . Colonoscopy with propofol N/A 02/09/2015    Procedure: COLONOSCOPY WITH PROPOFOL;  Surgeon: [name], MD;  Location: [facility] ENDOSCOPY;  Service: Endoscopy;  Laterality: N/A;  . Esophagogastroduodenoscopy N/A 02/09/2015    Procedure: ESOPHAGOGASTRODUODENOSCOPY (EGD);  Surgeon: [name], MD;  Location: [facility] ENDOSCOPY;  Service: Endoscopy;  Laterality: N/A;  . Thyroid surgery    . Hip surgery      There were no vitals filed for this visit.  Visit Diagnosis:  Fascial defect  Pelvic floor dysfunction  Lower abdominal pain  Pain in joint, pelvic region and thigh, unspecified laterality      Subjective Assessment - 11/07/15 0919    Subjective Pt reports she has been practicing self -massage on scar but she finds the R side of the scar to feel sharp and unbearable to massage. Pt states she had one pelvic mm spasm episode that was debilitating. Pt was  tearful when she expressed not being able to reach around her body to clean herself after toileting.  Pt  also reported she would like to get access to loftstrand crutches.Her gout continues impact her walking.    Patient is accompained by: Family member  Daughter    Pertinent History Pt reports she currently has a gout episode in her LLE. 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  LLE leg discrepancy on L (wears a shoe lift). R LE has had more surgeries.   In 2016, benign polyps were removed upon + findings on colonscopy.    Limitations Standing;Walking            [facility] PT Assessment - 11/07/15 2255    Observation/Other Assessments   Observations dtr assisted doffing pants, pt was encouraged to don pants IND, pt did in a modified way                      [facility] Adult PT Treatment/Exercise - 11/07/15 2255    Ambulation/Gait   Gait Pattern --  L rotation of trunk, leaning upon SPC    Gait velocity 0.2 m/s w/ [facility]    Self-Care   Self-Care --  listening to goals, referral for OT   Therapeutic Activites    Therapeutic Activities --  standing at sink, walking    Neuro Re-ed  Neuro Re-ed Details  diaphragmatic breathing    Exercises   Exercises --  see pt instructions   Manual Therapy   Manual therapy comments scar massage along R hip in sidelying, R abdominal scar, MWM with open book and STM along paraspinals                  PT Education - 11/07/15 2255    Education provided Yes   Education Details HEP   Person(s) Educated Patient   Methods Explanation;Demonstration;Tactile cues;Verbal cues;Handout   Comprehension Returned demonstration;Verbalized understanding             PT Long Term Goals - 11/07/15 2301    PT LONG TERM GOAL #1   Title Pt will demo decreased restrictions at lower abdominal scar and R LE in order to decrease pain and tolerate R sidelying for sleeping.    Time 12   Period Weeks   Status New   PT LONG TERM GOAL #2    Title Pt will report being able to continue shopping despite pelvic mm spasm in order to perform ADLs.   Time 12   Period Weeks   Status New   PT LONG TERM GOAL #3   Title Pt will demo no tenderness to palpation to pelvic floor mm in order to demonstrate increased IND in managing pelvic pain for improved QOL.   Time 12   Period Weeks   Status New   PT LONG TERM GOAL #4   Title Pt will decrease her NIH-CPSI Female score from 41 pts to < 31 pts in order to improve pelvic floor function.   Time 12   Period Weeks   Status New   PT LONG TERM GOAL #5   Title Pt will be able to achieve functional ROM of trunk to reach around her body for cleaning herself after toileting.    Time 12   Period Weeks   Status New               Plan - 11/07/15 2257    Clinical Impression Statement Pt demo'd decreased abdominal scar restrictions but required manual Tx to release hypertrophic scars over R lateral LE.  Initiated stretches for flexibility and relaxation. Pt demonstrated improved diaphragmatic breathing. Pt demo'd modifications to donning pants, required min A from dtr w/ doffing pants. Pt expressed frustration with tears in not being able to reach around her body to clean herself after toileting. PT sent a referral to Dr. Rayann Heman for OT as pt would benefit from a PT/OT team to help her with her ADLs. Plan to addrss pt's request for loftstrand canes at next session.    Pt will benefit from skilled therapeutic intervention in order to improve on the following deficits Abnormal gait;Decreased activity tolerance;Decreased balance;Decreased mobility;Decreased safety awareness;Decreased strength;Difficulty walking;Dizziness;Impaired perceived functional ability;Obesity;Pain;Decreased coordination;Decreased range of motion;Increased muscle spasms;Postural dysfunction;Impaired tone;Decreased scar mobility;Improper body mechanics;Impaired sensation;Increased fascial restricitons;Decreased endurance   Rehab  Potential Poor   Clinical Impairments Affecting Rehab Potential Positive: motivation, Negative: chronicity, comorbidities, chronic pain, psychosocial factors   PT Frequency 2x / week   PT Duration 12 weeks   PT Treatment/Interventions ADLs/Self Care Home Management;Aquatic Therapy;DME Instruction;Gait training;Stair training;Functional mobility training;Therapeutic activities;Therapeutic exercise;Balance training;Neuromuscular re-education;Cognitive remediation;Patient/family education;Manual techniques;Energy conservation;Vestibular;Cryotherapy;Electrical Stimulation;Biofeedback;Moist Heat;Taping;Scar mobilization;Passive range of motion   PT Next Visit Plan Balance outcome measure: BERG? single leg balance?, strengthening, balance, initiate HEP   Consulted and Agree with Plan of Care Patient        Problem List  Patient Active Problem List   Diagnosis Date Noted  . Gout 10/26/2015  . [facility] (congenital dislocation of the hip) 06/19/2015  . HLD (hyperlipidemia) 06/19/2015  . BP (high blood pressure) 06/19/2015  . Adiposity 06/19/2015  . Borderline diabetes mellitus 06/19/2015  . Obstructive apnea 06/19/2015  . H/O malignant neoplasm of thyroid 07/15/2014  . Hypothyroidism, postop 07/15/2014  . Degeneration of intervertebral disc of lumbar region 03/18/2013  . Fibromyalgia 03/18/2013  . LBP (low back pain) 03/18/2013    Jerl Mina ,PT, DPT, E-RYT  11/07/2015, 11:10 PM  [place] MAIN [facility] SERVICES [address] [place], [place], [address] Phone: [phone]   Fax:  [phone]  Name: Katie Baker MRN: [id number] Date of Birth: 09-14-1960

## 2015-11-07 NOTE — Addendum Note (Signed)
Addended by: Jerl Mina on: 11/07/2015 10:51 PM   Modules accepted: Orders

## 2015-11-09 ENCOUNTER — Ambulatory Visit: Payer: Medicare Other | Admitting: Physical Therapy

## 2015-11-09 DIAGNOSIS — M6289 Other specified disorders of muscle: Secondary | ICD-10-CM

## 2015-11-09 DIAGNOSIS — M25559 Pain in unspecified hip: Secondary | ICD-10-CM

## 2015-11-09 DIAGNOSIS — M629 Disorder of muscle, unspecified: Secondary | ICD-10-CM | POA: Diagnosis not present

## 2015-11-09 DIAGNOSIS — R1030 Lower abdominal pain, unspecified: Secondary | ICD-10-CM

## 2015-11-09 NOTE — Patient Instructions (Addendum)
STRETCHING:   1. Perform stretches you learned last session (in bed) neck, shoulders, hips and knees, ankles pumps   ADD windshield wipers of ankles (side to side) to relax hips    2. Seated on edge of bed or chair:  Add Seated twist 3 breaths holding position, 3 x each side            "Drawing the sword"   3x  3 sets , each side

## 2015-11-10 NOTE — Therapy (Signed)
[place] MAIN [facility] SERVICES [address] [place], [place], [address] Phone: [phone]   Fax:  [phone]  Physical Therapy Treatment  Patient Details  Name: Katie Baker MRN: [id number] Date of Birth: 19-Nov-1960 Referring Provider: Arther Dames MD  Encounter Date: 11/09/2015      PT End of Session - 2015-11-27 1116    Visit Number 3   Date for PT Re-Evaluation 02-11-2016   Authorization Type g codes every December 28, 2022 visit   11-27-22    PT Start Time 1510   PT Stop Time 1605   PT Time Calculation (min) 55 min   Activity Tolerance Patient tolerated treatment well   Behavior During Therapy [facility] for tasks assessed/performed      Past Medical History  Diagnosis Date  . Thyroid cancer ([place])   . Reflux   . Hypothyroidism   . Hypertension   . Hyperlipidemia   . Obesity   . Dislocation of hip, congenital   . Fibromyalgia   . Sleep apnea   . Prediabetes   . Diabetes mellitus, type II ([place])   . Tendonitis   . Spinal stenosis     Past Surgical History  Procedure Laterality Date  . Colonoscopy with propofol N/A 02/09/2015    Procedure: COLONOSCOPY WITH PROPOFOL;  Surgeon: [name], MD;  Location: [facility] ENDOSCOPY;  Service: Endoscopy;  Laterality: N/A;  . Esophagogastroduodenoscopy N/A 02/09/2015    Procedure: ESOPHAGOGASTRODUODENOSCOPY (EGD);  Surgeon: [name], MD;  Location: [facility] ENDOSCOPY;  Service: Endoscopy;  Laterality: N/A;  . Thyroid surgery    . Hip surgery      There were no vitals filed for this visit.  Visit Diagnosis:  Fascial defect  Pelvic floor dysfunction  Lower abdominal pain  Pain in joint, pelvic region and thigh, unspecified laterality      Subjective Assessment - 11-27-2015 1053    Subjective Pt has been walking as much as she can around her house performing chores. Pt reports she has been performing her scar massage but she feels like the scar is being cut open with a knife.     Pertinent  History Pt reports she currently has a gout episode in her LLE. 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  LLE leg discrepancy on L (wears a shoe lift). R LE has had more surgeries.   In 2016, benign polyps were removed upon + findings on colonscopy.    Limitations Standing;Walking            [facility] PT Assessment - 11/27/15 1102    Observation/Other Assessments   Observations pt donned/doffed pants w/ SPC   AROM   Overall AROM Comments seated trunk rotation L > R.  Severely limited hip ER /abd for hooklying position during intravaginal assessment     Palpation   Spinal mobility slightly decreased mm tensions along back and shoulders    Palpation comment improved abdominal scar mobility but R lateral hip with hypertrophic scars, increased mobility slightly post Tx                  Pelvic Floor Special Questions - 2015-11-27 1110    Pelvic Floor Internal Exam pt consented verbally without contraindications    Exam Type Vaginal   Palpation tenderness at anterior perineal triangle. increased adipose tissue. asessment of deeper pelvic floor mm deferred due to increased adipose tissue and pt's limited mobility for hip ER/abd.    Biofeedback guided pt pelvic floor lengthening , noted  excursion           [facility] Adult PT Treatment/Exercise - 11/10/15 1112    Therapeutic Activites    Therapeutic Activities --  walking 40 ft w/ SPC and R UE support oN WC , supervision    Neuro Re-ed    Neuro Re-ed Details  reveiwed HEP and progressed to seated trunk twist to address mobility for wiping perineal area after bowel movement, PNF D1 ext     Exercises   Exercises --  hip IR/ER in supine w/ pillow under knees   Manual Therapy   Manual therapy comments thiele massage on anterior traingle mm (first layer)    Myofascial Release transverse (superficial/ moderate) scar releases on R hip   colon massage                 PT Education - 11/10/15 1116    Education provided  Yes   Education Details HEP   Person(s) Educated Patient   Methods Explanation;Demonstration;Tactile cues;Verbal cues;Handout   Comprehension Returned demonstration;Verbalized understanding             PT Long Term Goals - 11/10/15 1123    PT LONG TERM GOAL #1   Title Pt will demo decreased restrictions at lower abdominal scar and R LE in order to decrease pain and tolerate R sidelying for sleeping.    Time 12   Period Weeks   Status On-going   PT LONG TERM GOAL #2   Title Pt will report being able to continue shopping despite pelvic mm spasm in order to perform ADLs.   Time 12   Period Weeks   Status On-going   PT LONG TERM GOAL #3   Title Pt will demo no tenderness to palpation to pelvic floor mm in order to demonstrate increased IND in managing pelvic pain for improved QOL.   Time 12   Period Weeks   Status On-going   PT LONG TERM GOAL #4   Title Pt will decrease her NIH-CPSI Female score from 41 pts to < 31 pts in order to improve pelvic floor function.   Time 12   Period Weeks   Status On-going   PT LONG TERM GOAL #5   Title Pt will be able to achieve functional ROM of trunk to reach around her body for cleaning herself after toileting.    Time 12   Period Weeks   Status On-going   Additional Long Term Goals   Additional Long Term Goals Yes   PT LONG TERM GOAL #6   Title Pt will demo increased R trunk rotation at functional ROM in order for ADLs and self care with bowel movements.    Time 12   Period Weeks   Status New               Plan - 11/10/15 1117    Clinical Impression Statement Pt's dtr remained in waiting area today.  Pt demo'd IND w/ doffing/donning pants with use of SPC. Pt continues to benefit from scar releases and ROM exercises to address limited flexibility. Intravaginal assessment was tolerated but complete assessment of deeper mm was limited due to adipose tissue and limited hip ER/abd range of motion. Pt was able to demo proper pelvic  floor lengthening with improved diaphramtic breathing. Incorporated seated stretches with PNF ext of UE and seated trunk rotation to promote spinal / scar mobility/ GI motility. Pt will benefit from skilled PT to address limited R trunk rotation compared L in order  to address goal to clean after herself with bowel movements and to promote regularity of bowel movements. Pt was not tearful at this session with display of brighter affect.  Still waiting on MD referral for OT.  Applied biopsychosocial approach for pain management.   Pt will benefit from skilled therapeutic intervention in order to improve on the following deficits Abnormal gait;Decreased activity tolerance;Decreased balance;Decreased mobility;Decreased safety awareness;Decreased strength;Difficulty walking;Dizziness;Impaired perceived functional ability;Obesity;Pain;Decreased coordination;Decreased range of motion;Increased muscle spasms;Postural dysfunction;Impaired tone;Decreased scar mobility;Improper body mechanics;Impaired sensation;Increased fascial restricitons;Decreased endurance   PT Frequency 2x / week   PT Duration 12 weeks   PT Next Visit Plan Balance outcome measure: BERG? single leg balance?, strengthening, balance, initiate HEP   PT Home Exercise Plan advanced- see patient instructions   Consulted and Agree with Plan of Care Patient        Problem List Patient Active Problem List   Diagnosis Date Noted  . Gout 10/26/2015  . [facility] (congenital dislocation of the hip) 06/19/2015  . HLD (hyperlipidemia) 06/19/2015  . BP (high blood pressure) 06/19/2015  . Adiposity 06/19/2015  . Borderline diabetes mellitus 06/19/2015  . Obstructive apnea 06/19/2015  . H/O malignant neoplasm of thyroid 07/15/2014  . Hypothyroidism, postop 07/15/2014  . Degeneration of intervertebral disc of lumbar region 03/18/2013  . Fibromyalgia 03/18/2013  . LBP (low back pain) 03/18/2013    Jerl Mina ,PT, DPT, E-RYT  11/10/2015, 11:25  AM  [place] MAIN [facility] SERVICES [address] [place], [place], [address] Phone: [phone]   Fax:  [phone]  Name: Katie Baker MRN: [id number] Date of Birth: 06/20/1961

## 2015-11-13 ENCOUNTER — Ambulatory Visit: Payer: Medicare Other | Admitting: Occupational Therapy

## 2015-11-13 ENCOUNTER — Encounter: Payer: Medicare Other | Admitting: Physical Therapy

## 2015-11-13 DIAGNOSIS — Z741 Need for assistance with personal care: Secondary | ICD-10-CM

## 2015-11-13 DIAGNOSIS — R460 Very low level of personal hygiene: Secondary | ICD-10-CM

## 2015-11-13 DIAGNOSIS — R531 Weakness: Secondary | ICD-10-CM

## 2015-11-13 DIAGNOSIS — R279 Unspecified lack of coordination: Secondary | ICD-10-CM

## 2015-11-13 DIAGNOSIS — M629 Disorder of muscle, unspecified: Secondary | ICD-10-CM | POA: Diagnosis not present

## 2015-11-13 NOTE — Therapy (Signed)
[place] MAIN [facility] SERVICES [place], [place], [address] Phone: [phone]   Fax:  [phone]  Occupational Therapy Evaluation  Patient Details  Name: Katie Baker MRN: [id number] Date of Birth: 03-18-1961 Referring Provider: Dr. Rayann Heman  Encounter Date: 11/13/2015      OT End of Session - 11/13/15 1702    Visit Number 1   Number of Visits 8   Date for OT Re-Evaluation 01/08/16   Authorization Type G-code day 1 of 10   OT Start Time 1515   OT Stop Time 1630   OT Time Calculation (min) 75 min   Activity Tolerance Patient tolerated treatment well;Patient limited by pain   Behavior During Therapy [facility] for tasks assessed/performed      Past Medical History  Diagnosis Date  . Thyroid cancer ([place])   . Reflux   . Hypothyroidism   . Hypertension   . Hyperlipidemia   . Obesity   . Dislocation of hip, congenital   . Fibromyalgia   . Sleep apnea   . Prediabetes   . Diabetes mellitus, type II ([place])   . Tendonitis   . Spinal stenosis     Past Surgical History  Procedure Laterality Date  . Colonoscopy with propofol N/A 02/09/2015    Procedure: COLONOSCOPY WITH PROPOFOL;  Surgeon: [name], MD;  Location: [facility] ENDOSCOPY;  Service: Endoscopy;  Laterality: N/A;  . Esophagogastroduodenoscopy N/A 02/09/2015    Procedure: ESOPHAGOGASTRODUODENOSCOPY (EGD);  Surgeon: [name], MD;  Location: [facility] ENDOSCOPY;  Service: Endoscopy;  Laterality: N/A;  . Thyroid surgery    . Hip surgery      There were no vitals filed for this visit.  Visit Diagnosis:  Weakness generalized  Self-care deficit for toileting  Lack of coordination      Subjective Assessment - 11/13/15 1704    Subjective  Pt. reports having difficulty managing ADL tasks at home    Pertinent History Pt. is a 55 y.o. female who presents with an extensive medical history which hinders her ability to perform  ADL and IADL tasks. This pt.currently  resides at home alone, and relies on familiy to assist with ADLs, IADLs, and transportation. This patient was referred to OT services for  ADlL/IADL retraining, and adaptive equipment  training.    Patient Stated Goals To be able to perform toileting care needs.   Currently in Pain? Yes   Pain Score 10-Worst pain ever   Pain Location Foot   Pain Orientation Right   Effect of Pain on Daily Activities Pt. also has Fibromyalgia pain 10/10 at all times.    Multiple Pain Sites Yes           [facility] OT Assessment - 11/13/15 1531    Assessment   Diagnosis Congental Hip Dysplasis   Referring Provider Dr. Rayann Heman   Onset Date 11/12/85   Balance Screen   Has the patient fallen in the past 6 months No   Has the patient had a decrease in activity level because of a fear of falling?  Yes   Is the patient reluctant to leave their home because of a fear of falling?  Yes   Home  Environment   Family/patient expects to be discharged to: Private residence   Living Arrangements Alone   Available Help at Discharge Family   Type of [place] Two level   Alternate Level Stairs - Number of Steps 15  Bathroom Shower/Tub Tub only;Gaffer;Door   Biochemist, clinical No   Adaptive equipment Other (comment)   [place] - single point;Walker - 4 wheels;Electric scooter;Wheelchair - manual   Lives With Alone   Prior Function   Level of [place] Independent   Vocation On disability;Retired   ADL   Camera operator Increased time;Min guard   Lower Body Dressing Moderate assistance   Where Assessed - [place] Maximal assistance   Toileting -  Hygiene Moderate assistance   Toileting - Hygiene Details ( indicate cue type & reason Pt. has difficulty performing toilet hygiene care thoroughly    Transfers/Ambulation Related to ADL's CGA   ADL  comments Pt. reports that she drops objects from her hands occassionally during feeding, and grooming tasks.   IADL   Shopping Needs to be accompanied on any shopping trip;Assistance for transportation   Prior Level of Function Light Housekeeping Pt. has difficulty completing.   Light Housekeeping Needs help with all home maintenance tasks   Medication Management Is responsible for taking medication in correct dosages at correct time;Takes responsibility if medication is prepared in advance in seperate dosage   Financial Management Manages financial matters independently (budgets, writes checks, pays rent, bills goes to bank), collects and keeps track of income   Written Expression   Dominant Hand Right   Vision - History   Baseline Vision Wears glasses all the time   Coordination   Right 9 Hole Peg Test 1 min & 2 sec.   Left 9 Hole Peg Test 1 min. & 3 sec.   AROM   Overall AROM Comments BUE ROM ROM to 90 degrees, with painful PROM limitations beyond 90 degrees.    Strength   Overall Strength Comments Bilateral shoulder flexion 3-/5, right elbow fflex/ext. 4-/5, wrist flex/ext 3+/5, left elbow flex/ext. 4/5, wrist flex/ext 4/5   Hand Function   Right Hand Grip (lbs) 5#   Right Hand Lateral Pinch 6 lbs   Right Hand 3 Point Pinch 5 lbs   Left Hand Grip (lbs) 5#   Left Hand Lateral Pinch 6 lbs   Left 3 point pinch 4 lbs         Self-Care/ADL training Treatment Session  Pt. was provided with visual education about a toilet aide. Visual and verbal demonstration was provided about the recommended proper use of the toilet aide tongs                       OT Long Term Goals - 11/13/15 1719    OT LONG TERM GOAL #1   Title Pt. will demonstrate independent A/E use for LE dressing.    Baseline unable   Time 8   Period Weeks   Status New   OT LONG TERM GOAL #2   Title Pt. will be independent with a toilet use for more thorough toilet hygiene care.   Baseline unable    Time 8   Period Weeks   Status New   OT LONG TERM GOAL #3   Title Pt. will  be demonstrate energy conservation/work simplification techniques for IADL tasks 100% of the time.     Baseline unable   Time 8   Period Weeks   Status New   OT LONG TERM GOAL #4   Title Pt. will demonstrate safe shower transfers using DME with supervision   Baseline unable   Time  8   OT LONG TERM GOAL #5   Title Pt. will increase right grip strength by 3# to assist with opening jar, and holding utensils.   Baseline drops items from hand occassionally   Time 8   Period Weeks   Status New   Long Term Additional Goals   Additional Long Term Goals Yes   OT LONG TERM GOAL #6   Title Pt. will improve right hand coordination by 3 sec. to be able grasp and hold grooming items.    Baseline 1 min & 2sec. on the 9-hole peg test   Time 8   Period Weeks   Status New               Plan - 2015/11/20 1715    Clinical Impression Statement Pt. is a 55 y.o female with a a hisory of Congential Hip Dysplasia, Hip surgery, Fibromyalgia, and Bells Palsy affecting the right side.  Pt. has significant difficulty performing self-care skills including: toileting hygiene care, LE dressing, UE dressing, bathing, shower transfers, light homemaking tasks and self grooming. Pt. requires skilled OT services to work towards improving her ADL and IADL functioning, and providing adaptive equipment training.   Pt will benefit from skilled therapeutic intervention in order to improve on the following deficits (Retired) Decreased balance;Impaired UE functional use;Decreased endurance;Decreased strength;Decreased range of motion;Decreased coordination   Rehab Potential Good   Clinical Impairments Affecting Rehab Potential Postive indicators: age; Negative Indicators: multiple comorbidities   OT Frequency 1x / week   OT Duration 8 weeks   OT Treatment/Interventions Self-care/ADL training;Therapeutic exercises;Manual Therapy;DME and/or AE  instruction;Therapeutic exercise;Patient/family education;Therapeutic activities   Plan Pt. reports that she does not want anything not covered by insurance    Consulted and Agree with Plan of Care Patient          G-Codes - Nov 20, 2015 1748    Functional Assessment Tool Used Clinical observation based on pt. current functional status   Functional Limitation Self care   Self Care Current Status [id number]) At least 20 percent but less than 40 percent impaired, limited or restricted   Self Care Goal Status [id number]) At least 1 percent but less than 20 percent impaired, limited or restricted      Problem List Patient Active Problem List   Diagnosis Date Noted  . Gout 10/26/2015  . [facility] (congenital dislocation of the hip) 06/19/2015  . HLD (hyperlipidemia) 06/19/2015  . BP (high blood pressure) 06/19/2015  . Adiposity 06/19/2015  . Borderline diabetes mellitus 06/19/2015  . Obstructive apnea 06/19/2015  . H/O malignant neoplasm of thyroid 07/15/2014  . Hypothyroidism, postop 07/15/2014  . Degeneration of intervertebral disc of lumbar region 03/18/2013  . Fibromyalgia 03/18/2013  . LBP (low back pain) 03/18/2013   [name], MS, OTR/L   [name] November 20, 2015, 5:50 PM  [place] MAIN [facility] SERVICES [address] [place], [place], [address] Phone: [phone]   Fax:  [phone]  Name: Katie Baker MRN: [id number] Date of Birth: 15-Aug-1961

## 2015-11-13 NOTE — Patient Instructions (Addendum)
Self-Care/ADL training Treatment Session  Pt. was provided with visual education about a toilet aide. Visual and verbal demonstration was provided about the recommended proper use.

## 2015-11-14 ENCOUNTER — Ambulatory Visit: Payer: Self-pay | Admitting: Psychiatry

## 2015-11-16 ENCOUNTER — Ambulatory Visit: Payer: Medicare Other

## 2015-11-16 ENCOUNTER — Encounter: Payer: Self-pay | Admitting: Podiatry

## 2015-11-16 ENCOUNTER — Ambulatory Visit (INDEPENDENT_AMBULATORY_CARE_PROVIDER_SITE_OTHER): Payer: Medicare Other | Admitting: Podiatry

## 2015-11-16 VITALS — BP 119/75 | HR 73 | Resp 18

## 2015-11-16 DIAGNOSIS — M79676 Pain in unspecified toe(s): Secondary | ICD-10-CM | POA: Diagnosis not present

## 2015-11-16 DIAGNOSIS — E119 Type 2 diabetes mellitus without complications: Secondary | ICD-10-CM

## 2015-11-16 DIAGNOSIS — M2550 Pain in unspecified joint: Secondary | ICD-10-CM | POA: Diagnosis not present

## 2015-11-16 DIAGNOSIS — M19079 Primary osteoarthritis, unspecified ankle and foot: Secondary | ICD-10-CM

## 2015-11-16 DIAGNOSIS — R52 Pain, unspecified: Secondary | ICD-10-CM | POA: Diagnosis not present

## 2015-11-16 DIAGNOSIS — S90221A Contusion of right lesser toe(s) with damage to nail, initial encounter: Secondary | ICD-10-CM | POA: Diagnosis not present

## 2015-11-16 DIAGNOSIS — B351 Tinea unguium: Secondary | ICD-10-CM | POA: Diagnosis not present

## 2015-11-16 NOTE — Progress Notes (Signed)
   Subjective:    Patient ID: Katie Baker, female    DOB: 18-Apr-1961, 55 y.o.   MRN: [id number]  HPI  55 year old female presents the office if her multiple concerns. She mainly appointment for possible black spot on the toenail and the right big toe. She states that she does not recall any history of injury or trauma. She is unsure of how long the spot is been there. Denies he swelling or redness to the toes. The toenails are elongated and thickened she cannot trim them herself. She also states his shoe is point with the congenitally dislocated hip result in a limited discrepancy in the left side. Her right foot is externally rotated. She is a lift for her left shoe. She states that she has a history of gout and she's had multiple tacks of both feet and since having gout she's had a lot of pain to her foot as well. No recent injury or trauma.  Review of Systems  All other systems reviewed and are negative.      Objective:   Physical Exam General: AAO x3, NAD  Dermatological: Nails are very somewhat dystrophic, discolored, hypertrophic. There is tenderness to nails 1-5 bilaterally. No surrounding redness or drainage. On the distal aspect of the medial border of the right hallux toenails a small black spot under the nail which is consistent with a subungual hematoma. Upon debridement the nail the subungual hematoma was removed and there is no underlying skin change or discoloration. No other open lesions identified.  Vascular: Dorsalis Pedis artery and Posterior Tibial artery pedal pulses are 2/4 bilateral with immedate capillary fill time. Pedal hair growth present. No varicosities and no lower extremity edema present bilateral. There is no pain with calf compression, swelling, warmth, erythema.   Neruologic: Grossly intact via light touch bilateral. Vibratory intact via tuning fork bilateral. Protective threshold with Semmes Wienstein monofilament intact to all pedal sites bilateral. Patellar  and Achilles deep tendon reflexes 2+ bilateral. No Babinski or clonus noted bilateral.   Musculoskeletal:  There is slightly over a 4 cm limb length discrepancy the left side shorter than the right. The right leg is excellent rotated. There is discomfort diffusely overlying the left ankle and bilateral foot. There is no edema, erythema, increase in warmth. No pinpoint area of tenderness and no pain vibratory sensation.  Gait: Unassisted, Nonantalgic.       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  -Etiology of symptoms were discussed -At this time there is no evidence of acute gout. -I do believe she likely benefit from a diabetic shoe with an insert with a for severe left on the left shoe. She currently has a 4 cm lift her shoes are over 50 years old. A prescription was provided today for [facility]. -She also has a history of multiple gout attacks her feet she is also having multiple joint pains. She was previously seen a rheumatologist however discontinued.  should be sent back up with rheumatology. Referral place today. -Nails sharply debrided 10 without complications or bleeding. Subungual hematoma was removed right foot big toenail. -Follow-up in 3 months or sooner if any problems arise. In the meantime, encouraged to call the office with any questions, concerns, change in symptoms.   Katie Baker, DPM

## 2015-11-20 ENCOUNTER — Encounter: Payer: Medicare Other | Admitting: Occupational Therapy

## 2015-11-20 ENCOUNTER — Other Ambulatory Visit: Payer: Self-pay | Admitting: Internal Medicine

## 2015-11-20 ENCOUNTER — Ambulatory Visit: Payer: Medicare Other | Admitting: Physical Therapy

## 2015-11-20 ENCOUNTER — Telehealth: Payer: Self-pay | Admitting: *Deleted

## 2015-11-20 DIAGNOSIS — R921 Mammographic calcification found on diagnostic imaging of breast: Secondary | ICD-10-CM

## 2015-11-20 DIAGNOSIS — M19079 Primary osteoarthritis, unspecified ankle and foot: Secondary | ICD-10-CM

## 2015-11-20 DIAGNOSIS — R52 Pain, unspecified: Secondary | ICD-10-CM

## 2015-11-20 DIAGNOSIS — M2550 Pain in unspecified joint: Secondary | ICD-10-CM

## 2015-11-20 NOTE — Telephone Encounter (Addendum)
-----   Message from Trula Slade, DPM sent at 11/16/2015  5:58 PM EDT ----- Can you please order a rhematology eval. She has chronic gout with multiple joint pains aside from feet. She previously was being treated by rheumatology, but they apparently moved or something.   Faxed referral with clinicals and demographics.

## 2015-11-21 ENCOUNTER — Ambulatory Visit
Admission: RE | Admit: 2015-11-21 | Discharge: 2015-11-21 | Disposition: A | Discharge status: Home / Self Care | Payer: Medicare Other | Source: Ambulatory Visit | Attending: Internal Medicine | Admitting: Internal Medicine

## 2015-11-21 DIAGNOSIS — R921 Mammographic calcification found on diagnostic imaging of breast: Secondary | ICD-10-CM

## 2015-11-24 ENCOUNTER — Ambulatory Visit: Payer: Medicare Other | Admitting: Physical Therapy

## 2015-11-24 ENCOUNTER — Ambulatory Visit: Payer: Medicare Other | Admitting: Occupational Therapy

## 2015-11-29 ENCOUNTER — Ambulatory Visit: Payer: Medicare Other | Admitting: Occupational Therapy

## 2015-11-29 ENCOUNTER — Ambulatory Visit: Payer: Medicare Other | Admitting: Physical Therapy

## 2015-11-29 DIAGNOSIS — M629 Disorder of muscle, unspecified: Secondary | ICD-10-CM

## 2015-11-29 DIAGNOSIS — R1030 Lower abdominal pain, unspecified: Secondary | ICD-10-CM

## 2015-11-29 DIAGNOSIS — M6281 Muscle weakness (generalized): Secondary | ICD-10-CM

## 2015-11-29 DIAGNOSIS — M6289 Other specified disorders of muscle: Secondary | ICD-10-CM

## 2015-11-29 DIAGNOSIS — M25559 Pain in unspecified hip: Secondary | ICD-10-CM

## 2015-11-29 NOTE — Therapy (Signed)
[place] MAIN [facility] SERVICES [address] [place], [place], [address] Phone: [phone]   Fax:  [phone]  Occupational Therapy Treatment  Patient Details  Name: Katie Baker MRN: [id number] Date of Birth: 1961-08-08 Referring Provider: Dr. Rayann Heman  Encounter Date: 11/29/2015      OT End of Session - 11/29/15 1735    Visit Number 2   Number of Visits 8   Date for OT Re-Evaluation 01/08/16   Authorization Type G-code day 2 of 10   OT Start Time 1420   OT Stop Time 1500   OT Time Calculation (min) 40 min   Activity Tolerance Patient tolerated treatment well;Patient limited by pain   Behavior During Therapy [facility] for tasks assessed/performed      Past Medical History  Diagnosis Date  . Thyroid cancer ([place])   . Reflux   . Hypothyroidism   . Hypertension   . Hyperlipidemia   . Obesity   . Dislocation of hip, congenital   . Fibromyalgia   . Sleep apnea   . Prediabetes   . Diabetes mellitus, type II ([place])   . Tendonitis   . Spinal stenosis     Past Surgical History  Procedure Laterality Date  . Colonoscopy with propofol N/A 02/09/2015    Procedure: COLONOSCOPY WITH PROPOFOL;  Surgeon: [name], MD;  Location: [facility] ENDOSCOPY;  Service: Endoscopy;  Laterality: N/A;  . Esophagogastroduodenoscopy N/A 02/09/2015    Procedure: ESOPHAGOGASTRODUODENOSCOPY (EGD);  Surgeon: [name], MD;  Location: [facility] ENDOSCOPY;  Service: Endoscopy;  Laterality: N/A;  . Thyroid surgery    . Hip surgery      There were no vitals filed for this visit.  Visit Diagnosis:  Muscle weakness (generalized)      Subjective Assessment - 11/29/15 1421    Subjective  Pt. reports 20/10 general pain. Pt. reports being able to participate.   Pertinent History Pt. is a 55 y.o. female who presents with an extensive medical history which hinders her ability to perform  ADL and IADL tasks. This pt.currently resides at home alone, and relies on  familiy to assist with ADLs, IADLs, and transportation. This patient was referred to OT services for  ADlL/IADL retraining, and adaptive equipment  training.    Currently in Pain? Yes   Pain Score 10-Worst pain ever      OT TREATMENT    Selfcare: Pt. Ed was provided about A/E use for LE dressing and bathing. Pt. Was able to simulate proper LH sponge use. Pt. Was able to demonstrate proper reacher use for doffing and donning socks, sockaide use for donning socks, LH shoehorn for donning shoes. Pt. ed was provided about proper reacher use for donning pants. Pt. Was provided with a LH shoehorn, reacher, LHsponge, and a sockaide. Pt. Ed was provided about energy conservation techniques.                              OT Long Term Goals - 11/13/15 1719    OT LONG TERM GOAL #1   Title Pt. will demonstrate independent A/E use for LE dressing.    Baseline unable   Time 8   Period Weeks   Status New   OT LONG TERM GOAL #2   Title Pt. will be independent with a toilet use for more thorough toilet hygiene care.   Baseline unable   Time 8   Period Weeks   Status  New   OT LONG TERM GOAL #3   Title Pt. will  be demonstrate energy conservation/work simplification techniques for IADL tasks 100% of the time.     Baseline unable   Time 8   Period Weeks   Status New   OT LONG TERM GOAL #4   Title Pt. will demonstrate safe shower transfers using DME with supervision   Baseline unable   Time 8   OT LONG TERM GOAL #5   Title Pt. will increase right grip strength by 3# to assist with opening jar, and holding utensils.   Baseline drops items from hand occassionally   Time 8   Period Weeks   Status New   Long Term Additional Goals   Additional Long Term Goals Yes   OT LONG TERM GOAL #6   Title Pt. will improve right hand coordination by 3 sec. to be able grasp and hold grooming items.    Baseline 1 min & 2sec. on the 9-hole peg test   Time 8   Period Weeks   Status New                Plan - 11/29/15 1736    Clinical Impression Statement Pt. was able to demonsrate understanding of a reacher, sockaide, LH shoehorn, and LH sponge. Pt. was provided with these items. Pt. reports having 20+/10 all the time, even at home. Pt. tolerated the session without difficulty.   Pt will benefit from skilled therapeutic intervention in order to improve on the following deficits (Retired) Decreased balance;Impaired UE functional use;Decreased endurance;Decreased strength;Decreased range of motion;Decreased coordination   Rehab Potential Good   Clinical Impairments Affecting Rehab Potential Postive indicators: age; Negative Indicators: multiple comorbidities   OT Frequency 1x / week   OT Duration 8 weeks   OT Treatment/Interventions Self-care/ADL training;Therapeutic exercises;Manual Therapy;DME and/or AE instruction;Therapeutic exercise;Patient/family education;Therapeutic activities   Consulted and Agree with Plan of Care Patient        Problem List Patient Active Problem List   Diagnosis Date Noted  . Gout 10/26/2015  . [facility] (congenital dislocation of the hip) 06/19/2015  . HLD (hyperlipidemia) 06/19/2015  . BP (high blood pressure) 06/19/2015  . Adiposity 06/19/2015  . Borderline diabetes mellitus 06/19/2015  . Obstructive apnea 06/19/2015  . H/O malignant neoplasm of thyroid 07/15/2014  . Hypothyroidism, postop 07/15/2014  . Degeneration of intervertebral disc of lumbar region 03/18/2013  . Fibromyalgia 03/18/2013  . LBP (low back pain) 03/18/2013   Katie Carina, MS, OTR/L  Katie Baker 11/29/2015, 5:39 PM  [place] MAIN [facility] SERVICES [address] [place], [place], [address] Phone: [phone]   Fax:  [phone]  Name: Katie Baker MRN: [id number] Date of Birth: [birth year]

## 2015-11-29 NOTE — Patient Instructions (Signed)
OT TREATMENT    Selfcare: Pt. Ed was provided about A/E use for LE dressing and bathing. Pt. Was able to simulate proper LH sponge use. Pt. Was able to demonstrate proper reacher use for doffing and donning socks, sockaide use for donning socks, LH shoehorn for donning shoes. Pt. ed was provided about proper reacher use for donning pants. Pt. Was provided with a LH shoehorn, reacher, LHsponge, and a sockaide. Pt. Ed was provided about energy conservation techniques.

## 2015-11-29 NOTE — Patient Instructions (Signed)
colon massage (bottom right to top to left top and then left bottom)  5 x before and after meals    let pelvic floor / rectum relax with inhale    standing stretch (by wall)    -hip flexor  Left foot forward, right foot back hip width apart 30 sec x 3 / 3 x day   - perpendicular to door, holding wall, tap foot sideways away midline and back 10 x each leg / 3 x day     Moving in bed and sit to stand with exhalation (do not hold breath)

## 2015-11-30 NOTE — Therapy (Addendum)
[place] MAIN [facility] SERVICES [address] [place], [place], [address] Phone: [phone]   Fax:  [phone]  Physical Therapy Treatment  Patient Details  Name: Katie Baker MRN: [id number] Date of Birth: 1961/08/10 Referring Provider: Arther Dames MD  Encounter Date: 11/29/2015      PT End of Session - 11/30/15 2205    Visit Number 4   Number of Visits 12   Date for PT Re-Evaluation 02-12-2016   Authorization Type g codes every 10th visit   12-29-2022    PT Start Time 1300   PT Stop Time 1405   PT Time Calculation (min) 65 min   Activity Tolerance Patient tolerated treatment well   Behavior During Therapy [facility] for tasks assessed/performed      Past Medical History  Diagnosis Date  . Thyroid cancer ([place])   . Reflux   . Hypothyroidism   . Hypertension   . Hyperlipidemia   . Obesity   . Dislocation of hip, congenital   . Fibromyalgia   . Sleep apnea   . Prediabetes   . Diabetes mellitus, type II ([place])   . Tendonitis   . Spinal stenosis     Past Surgical History  Procedure Laterality Date  . Colonoscopy with propofol N/A 02/09/2015    Procedure: COLONOSCOPY WITH PROPOFOL;  Surgeon: [name], MD;  Location: [facility] ENDOSCOPY;  Service: Endoscopy;  Laterality: N/A;  . Esophagogastroduodenoscopy N/A 02/09/2015    Procedure: ESOPHAGOGASTRODUODENOSCOPY (EGD);  Surgeon: [name], MD;  Location: [facility] ENDOSCOPY;  Service: Endoscopy;  Laterality: N/A;  . Thyroid surgery    . Hip surgery      There were no vitals filed for this visit.  Visit Diagnosis:  Fascial defect  Pelvic floor dysfunction  Lower abdominal pain  Pain in joint, pelvic region and thigh, unspecified laterality      Subjective Assessment - 11/30/15 2155    Subjective Pt reported she missed last week's appts due to gout flare-up.  Pt states she was able to have a bowel movement after last session and has increased her fiber. Pt has had regular with  BMs for every 2-3 days which is a significant improvement. Pt reports increased swelling in her hands and fingers and legs from RA.     Pertinent History Pt reports she currently has a gout episode in her LLE. 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  LLE leg discrepancy on L (wears a shoe lift). R LE has had more surgeries.   In 2016, benign polyps were removed upon + findings on colonscopy.    Limitations Standing;Walking            [facility] PT Assessment - 11/30/15 2157    Observation/Other Assessments   Observations swelling UE/LE (L > R) pitting edema 1+/4    Palpation   Palpation comment decreased scar restriction along R hip/groin  upper abdomen non soft    Bed Mobility   Bed Mobility --  smoother movement supine> EOB   Ambulation/Gait   Gait Comments quicker gait but speed to be taken at next session                     [facility] Adult PT Treatment/Exercise - 11/30/15 2157    Self-Care   Self-Care --  discussed lymphema therapy referral to manage swelling   Exercises   Exercises --  see pt instructions   Manual Therapy   Myofascial Release scar massage along  R hip, groin. Colon massage.                 PT Education - 11/30/15 2205    Education provided Yes   Education Details HEP   Person(s) Educated Patient   Methods Explanation;Demonstration;Tactile cues;Verbal cues;Handout   Comprehension Returned demonstration;Verbalized understanding             PT Long Term Goals - 11/30/15 2211    PT LONG TERM GOAL #1   Title Pt will demo decreased restrictions at lower abdominal scar and R LE in order to decrease pain and tolerate R sidelying for sleeping.    Time 12   Period Weeks   Status On-going   PT LONG TERM GOAL #2   Title Pt will report being able to continue shopping despite pelvic mm spasm in order to perform ADLs.   Time 12   Period Weeks   Status Achieved   PT LONG TERM GOAL #3   Title Pt will demo no tenderness to  palpation to pelvic floor mm in order to demonstrate increased IND in managing pelvic pain for improved QOL.   Time 12   Period Weeks   Status Achieved   PT LONG TERM GOAL #4   Title Pt will decrease her NIH-CPSI Female score from 41 pts to < 31 pts in order to improve pelvic floor function.   Time 12   Period Weeks   Status On-going   PT LONG TERM GOAL #5   Title Pt will be able to achieve functional ROM of trunk to reach around her body for cleaning herself after toileting.    Time 12   Period Weeks   Status On-going   PT LONG TERM GOAL #6   Title Pt will demo increased R trunk rotation at functional ROM in order for ADLs and self care with bowel movements.    Time 12   Period Weeks   Status On-going                Plan - 11/30/15 2206    Clinical Impression Statement Pt reports no pelvic pain for the past 2 weeks. Pt has increased bowel movements. Pt shows improved scar mobility on R hip/groin and improved diaphragmatic and pelvic floor coordination. In order to progress towards her functional mobility goals, pt may benefit from a lymphedema therapy referral to learn how to manage edema at home (bilateral UE/ LE with L > R) . After completing a few visits with the lymphema specialist, pt would benefit from aquatic therapy to address edema,  ROM and postural stability. Current PT will be the same PT providing aquatic therapy.    Pt will benefit from skilled therapeutic intervention in order to improve on the following deficits Abnormal gait;Decreased activity tolerance;Decreased balance;Decreased mobility;Decreased safety awareness;Decreased strength;Difficulty walking;Dizziness;Impaired perceived functional ability;Obesity;Pain;Decreased coordination;Decreased range of motion;Increased muscle spasms;Postural dysfunction;Impaired tone;Decreased scar mobility;Improper body mechanics;Impaired sensation;Increased fascial restricitons;Decreased endurance   PT Frequency 2x / week   PT  Duration 12 weeks   PT Treatment/Interventions ADLs/Self Care Home Management;Aquatic Therapy;DME Instruction;Gait training;Stair training;Functional mobility training;Therapeutic activities;Therapeutic exercise;Balance training;Neuromuscular re-education;Cognitive remediation;Patient/family education;Manual techniques;Energy conservation;Vestibular;Cryotherapy;Electrical Stimulation;Biofeedback;Moist Heat;Taping;Scar mobilization;Passive range of motion   PT Next Visit Plan --   PT Home Exercise Plan --   Consulted and Agree with Plan of Care Patient        Problem List Patient Active Problem List   Diagnosis Date Noted  . Gout 10/26/2015  . [facility] (congenital dislocation of the hip) 06/19/2015  .  HLD (hyperlipidemia) 06/19/2015  . BP (high blood pressure) 06/19/2015  . Adiposity 06/19/2015  . Borderline diabetes mellitus 06/19/2015  . Obstructive apnea 06/19/2015  . H/O malignant neoplasm of thyroid 07/15/2014  . Hypothyroidism, postop 07/15/2014  . Degeneration of intervertebral disc of lumbar region 03/18/2013  . Fibromyalgia 03/18/2013  . LBP (low back pain) 03/18/2013    Jerl Mina ,PT, DPT, E-RYT  11/30/2015, 10:15 PM  [place] MAIN [facility] SERVICES [address] [place], [place], [address] Phone: [phone]   Fax:  [phone]  Name: JAMAIYA TUOMI MRN: [id number] Date of Birth: 1960-10-16

## 2015-12-01 ENCOUNTER — Ambulatory Visit: Payer: Medicare Other | Admitting: Physical Therapy

## 2015-12-04 ENCOUNTER — Ambulatory Visit: Payer: Medicare Other | Admitting: Physical Therapy

## 2015-12-04 ENCOUNTER — Ambulatory Visit: Payer: Medicare Other | Admitting: Occupational Therapy

## 2015-12-05 DIAGNOSIS — M1A00X0 Idiopathic chronic gout, unspecified site, without tophus (tophi): Secondary | ICD-10-CM | POA: Insufficient documentation

## 2015-12-06 ENCOUNTER — Ambulatory Visit: Payer: Medicare Other | Admitting: Occupational Therapy

## 2015-12-06 ENCOUNTER — Ambulatory Visit: Payer: Medicare Other | Admitting: Physical Therapy

## 2015-12-12 ENCOUNTER — Ambulatory Visit: Payer: Self-pay | Admitting: Physical Therapy

## 2015-12-12 DIAGNOSIS — R262 Difficulty in walking, not elsewhere classified: Secondary | ICD-10-CM

## 2015-12-12 DIAGNOSIS — R279 Unspecified lack of coordination: Secondary | ICD-10-CM

## 2015-12-13 ENCOUNTER — Ambulatory Visit: Payer: Medicare Other | Attending: Gastroenterology | Admitting: Physical Therapy

## 2015-12-13 ENCOUNTER — Ambulatory Visit: Payer: Medicare Other | Admitting: Occupational Therapy

## 2015-12-13 DIAGNOSIS — R262 Difficulty in walking, not elsewhere classified: Secondary | ICD-10-CM

## 2015-12-13 DIAGNOSIS — R279 Unspecified lack of coordination: Secondary | ICD-10-CM | POA: Diagnosis not present

## 2015-12-13 DIAGNOSIS — I890 Lymphedema, not elsewhere classified: Secondary | ICD-10-CM | POA: Diagnosis present

## 2015-12-13 DIAGNOSIS — R278 Other lack of coordination: Secondary | ICD-10-CM

## 2015-12-13 DIAGNOSIS — M6281 Muscle weakness (generalized): Secondary | ICD-10-CM | POA: Diagnosis present

## 2015-12-14 ENCOUNTER — Ambulatory Visit: Payer: Medicare Other | Admitting: Occupational Therapy

## 2015-12-14 ENCOUNTER — Ambulatory Visit: Payer: Medicare Other | Admitting: Physical Therapy

## 2015-12-14 ENCOUNTER — Encounter: Payer: Self-pay | Admitting: Occupational Therapy

## 2015-12-14 VITALS — Ht <= 58 in | Wt 175.0 lb

## 2015-12-14 DIAGNOSIS — R279 Unspecified lack of coordination: Secondary | ICD-10-CM | POA: Diagnosis not present

## 2015-12-14 DIAGNOSIS — I890 Lymphedema, not elsewhere classified: Secondary | ICD-10-CM

## 2015-12-14 NOTE — Patient Instructions (Signed)
Abdominal massage (handout)

## 2015-12-14 NOTE — Therapy (Signed)
[place] MAIN [facility] SERVICES [address] [place], [place], [address] Phone: [phone]   Fax:  [phone]  Physical Therapy Treatment / Progress Note  Patient Details  Name: Katie Baker MRN: [id number] Date of Birth: 10-23-1960 Referring Provider: Arther Dames MD  Encounter Date: 12/13/2015      PT End of Session - 12/14/15 0933    Visit Number 5   Number of Visits 12   Date for PT Re-Evaluation 2016/02/10   Authorization Type g codes every 10th visit  starting again at 1/10    (submitted at 5th visit 4/13 due to temporarily hold on PT while pt receives lymphedema Tx)   PT Start Time 1105   PT Stop Time 1150   PT Time Calculation (min) 45 min   Activity Tolerance Patient tolerated treatment well   Behavior During Therapy [facility] for tasks assessed/performed      Past Medical History  Diagnosis Date  . Thyroid cancer ([place])   . Reflux   . Hypothyroidism   . Hypertension   . Hyperlipidemia   . Obesity   . Dislocation of hip, congenital   . Fibromyalgia   . Sleep apnea   . Prediabetes   . Diabetes mellitus, type II ([place])   . Tendonitis   . Spinal stenosis     Past Surgical History  Procedure Laterality Date  . Colonoscopy with propofol N/A 02/09/2015    Procedure: COLONOSCOPY WITH PROPOFOL;  Surgeon: [name], MD;  Location: [facility] ENDOSCOPY;  Service: Endoscopy;  Laterality: N/A;  . Esophagogastroduodenoscopy N/A 02/09/2015    Procedure: ESOPHAGOGASTRODUODENOSCOPY (EGD);  Surgeon: [name], MD;  Location: [facility] ENDOSCOPY;  Service: Endoscopy;  Laterality: N/A;  . Thyroid surgery    . Hip surgery      There were no vitals filed for this visit.      Subjective Assessment - 12/13/15 1110    Subjective Pt reported mild pelvic pain and no longer has it to the degree that brings her to tears. Pt feels pressure in the pubic area and burning sensation in her abdomen .     Pertinent History Pt reports she  currently has a gout episode in her LLE. 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  LLE leg discrepancy on L (wears a shoe lift). R LE has had more surgeries.   In 2016, benign polyps were removed upon + findings on colonscopy.    Limitations Standing;Walking            [facility] PT Assessment - 12/14/15 0922    Palpation   Palpation comment pain at suprapubic area w/ tensions (decreased post-Tx). decreased scar restriction along R hip/groin  upper abdomen softer.    Ambulation/Gait   Gait Comments educated on not holding breath when pressing onto [facility]. pt demo'd correctly                     [facility] Adult PT Treatment/Exercise - 12/14/15 0922    Manual Therapy   Myofascial Release manual lymph drainage at abdomen, above clavicle, pect areas, abdominal massage with mons pubis myofascial release   guided pt to perform self -massage                PT Education - 12/14/15 0933    Education provided Yes   Education Details HEP   Person(s) Educated Patient   Methods Explanation;Demonstration;Tactile cues;Handout;Verbal cues   Comprehension Verbalized understanding;Returned demonstration  PT Long Term Goals - 12/13/15 1815    PT LONG TERM GOAL #1   Title Pt will demo decreased restrictions at lower abdominal scar and R LE in order to decrease pain and tolerate R sidelying for sleeping.    Time 12   Period Weeks   Status Partially Met   PT LONG TERM GOAL #2   Title Pt will report being able to continue shopping despite pelvic mm spasm in order to perform ADLs.   Time 12   Period Weeks   Status Achieved   PT LONG TERM GOAL #3   Title Pt will demo no tenderness to palpation to pelvic floor mm in order to demonstrate increased IND in managing pelvic pain for improved QOL.   Time 12   Period Weeks   Status Achieved   PT LONG TERM GOAL #4   Title Pt will decrease her NIH-CPSI Female score from 41 pts to < 31 pts in order to improve pelvic  floor function. (12/13/15: 28 pts)    Time 12   Period Weeks   Status Achieved   PT LONG TERM GOAL #5   Title Pt will be able to achieve functional ROM of trunk to reach around her body for cleaning herself after toileting.    Time 12   Period Weeks   Status On-going   PT LONG TERM GOAL #6   Title Pt will demo increased R trunk rotation at functional ROM in order for ADLs and self care with bowel movements.    Time 12   Period Weeks   Status On-going               Plan - 12/14/15 0936    Clinical Impression Statement Pt has achieved 3/7 goals and is progressing well towards her remaining goals. Pt reported her pelvic pain has improved and she has not had any spikes in pain over the past month that "bring her to tears". Pt's NIH-CPSI score decreased from 41 pts to 28 pts with positive improvements. Pt's poor coordination and walking deficits are likely contributing to her pain and continue skilled PT will be beneficial for pt in order to minimize relapse of Sx and to maintain/further progress. Pt was cued on decreasing breathholding when bearing weight on SPC during gait in order to decrease abdominal/pelvic strain. PT referred pt for an eval with a lymphedema specialist whom she sees tomorrow to address swelling and mobility. PT will be withheld temporarily while she receives lymphedema Tx. Plan to have pt return to PT for aquatic therapy to optimize mobility, gait and balance training to advance towards remaining goals.      PT Frequency 2x / week   PT Duration 12 weeks   PT Treatment/Interventions ADLs/Self Care Home Management;Aquatic Therapy;DME Instruction;Gait training;Stair training;Functional mobility training;Therapeutic activities;Therapeutic exercise;Balance training;Neuromuscular re-education;Cognitive remediation;Patient/family education;Manual techniques;Energy conservation;Vestibular;Cryotherapy;Electrical Stimulation;Biofeedback;Moist Heat;Taping;Scar mobilization;Passive  range of motion   Consulted and Agree with Plan of Care Patient      Patient will benefit from skilled therapeutic intervention in order to improve the following deficits and impairments:  Abnormal gait, Decreased activity tolerance, Decreased balance, Decreased mobility, Decreased safety awareness, Decreased strength, Difficulty walking, Dizziness, Impaired perceived functional ability, Obesity, Pain, Decreased coordination, Decreased range of motion, Increased muscle spasms, Postural dysfunction, Impaired tone, Decreased scar mobility, Improper body mechanics, Impaired sensation, Increased fascial restricitons, Decreased endurance  Visit Diagnosis: Unspecified lack of coordination - Plan: PT plan of care cert/re-cert, CANCELED: PT plan of care cert/re-cert  Difficulty in  walking, not elsewhere classified - Plan: PT plan of care cert/re-cert, CANCELED: PT plan of care cert/re-cert       G-Codes - 01/01/16 1852    Functional Assessment Tool Used clinical judgment , NIH-CPSI 41 pts (41 pts) to 28 pts (today), gait speed 0.2 m/s SPC   Functional Limitation Mobility: Walking and moving around   Mobility: Walking and Moving Around Current Status [phone]) At least 40 percent but less than 60 percent impaired, limited or restricted   Mobility: Walking and Moving Around Goal Status [phone]) At least 40 percent but less than 60 percent impaired, limited or restricted      Problem List Patient Active Problem List   Diagnosis Date Noted  . Gout 10/26/2015  . [facility] (congenital dislocation of the hip) 06/19/2015  . HLD (hyperlipidemia) 06/19/2015  . BP (high blood pressure) 06/19/2015  . Adiposity 06/19/2015  . Borderline diabetes mellitus 06/19/2015  . Obstructive apnea 06/19/2015  . H/O malignant neoplasm of thyroid 07/15/2014  . Hypothyroidism, postop 07/15/2014  . Degeneration of intervertebral disc of lumbar region 03/18/2013  . Fibromyalgia 03/18/2013  . LBP (low back pain) 03/18/2013     Jerl Mina ,PT, DPT, E-RYT  12/14/2015, 6:56 PM  [place] MAIN [facility] SERVICES [address] [place], [place], [address] Phone: [phone]   Fax:  [phone]  Name: SAIRAH KNOBLOCH MRN: [id number] Date of Birth: Jun 12, 1961

## 2015-12-15 ENCOUNTER — Encounter: Payer: Self-pay | Admitting: Occupational Therapy

## 2015-12-15 NOTE — Therapy (Signed)
[place] MAIN [facility] SERVICES [address] [place], [place], [address] Phone: [phone]   Fax:  [phone]  Occupational Therapy Treatment  Patient Details  Name: Katie Baker MRN: [id number] Date of Birth: 1961/01/18 Referring Provider: Dr. Rayann Heman  Encounter Date: 12/13/2015      OT End of Session - 12/15/15 0919    Visit Number 3   Number of Visits 8   Date for OT Re-Evaluation 01/08/16   Authorization Type G-code day 3 of 10   OT Start Time 1015   OT Stop Time 1100   OT Time Calculation (min) 45 min   Activity Tolerance Patient tolerated treatment well;Patient limited by pain   Behavior During Therapy [facility] for tasks assessed/performed      Past Medical History  Diagnosis Date  . Thyroid cancer ([place])   . Reflux   . Hypothyroidism   . Hypertension   . Hyperlipidemia   . Obesity   . Dislocation of hip, congenital   . Fibromyalgia   . Sleep apnea   . Prediabetes   . Diabetes mellitus, type II ([place])   . Tendonitis   . Spinal stenosis     Past Surgical History  Procedure Laterality Date  . Colonoscopy with propofol N/A 02/09/2015    Procedure: COLONOSCOPY WITH PROPOFOL;  Surgeon: [name], MD;  Location: [facility] ENDOSCOPY;  Service: Endoscopy;  Laterality: N/A;  . Esophagogastroduodenoscopy N/A 02/09/2015    Procedure: ESOPHAGOGASTRODUODENOSCOPY (EGD);  Surgeon: [name], MD;  Location: [facility] ENDOSCOPY;  Service: Endoscopy;  Laterality: N/A;  . Thyroid surgery    . Hip surgery      There were no vitals filed for this visit.      Subjective Assessment - 12/15/15 0912    Subjective  Patient reports she has 10/10 pain all the time, usually takes morphine but did not take this date since it makes her sleepy.  Daughter with her for treatment session.    Pertinent History Pt. is a 55 y.o. female who presents with an extensive medical history which hinders her ability to perform  ADL and IADL tasks. This  pt.currently resides at home alone, and relies on familiy to assist with ADLs, IADLs, and transportation. This patient was referred to OT services for  ADlL/IADL retraining, and adaptive equipment  training.    Patient Stated Goals To be able to perform toileting care needs.   Currently in Pain? Yes   Pain Score 10-Worst pain ever   Pain Location --  all over   Pain Descriptors / Indicators Aching   Pain Type Chronic pain   Effect of Pain on Daily Activities Pain all over, no specific body parts, fibromyalgia   Multiple Pain Sites No                      OT Treatments/Exercises (OP) - 12/14/15 0914    ADLs   ADL Comments Patient demonstrates difficulty with toilet transfers at home and would benefit from an elevated toilet, she is resistant to the use of a 3 in 1 commode due to her perception it is not hygienic to use or easy to clean (referring to the sheild) She was issued long handled sponge, shoehorn and sock aid and has been using over the last week with good success.  She has difficulty with the strings of the sockaid, therefore, therapist modified her sock at to add built up foam handles bilaterally with blue foam to increase  surface grip.      Fine Motor Coordination   Other Fine Motor Exercises Patient seen this date for fine motor coordination activities to improve coordination to manage picking up and manipulating self care items.  Focused on manipulation of 1/2 to 1 inch sized objects with right hand, cues for prehension patterns, translatory movements of the hand and using the hand for storage.                         OT Education - 12/14/15 0919    Education provided Yes   Education Details self care tasks, fine motor coordination skills.    Person(s) Educated Patient   Methods Explanation;Demonstration;Verbal cues   Comprehension Verbal cues required;Returned demonstration;Verbalized understanding             OT Long Term Goals - 11/13/15 1719     OT LONG TERM GOAL #1   Title Pt. will demonstrate independent A/E use for LE dressing.    Baseline unable   Time 8   Period Weeks   Status New   OT LONG TERM GOAL #2   Title Pt. will be independent with a toilet use for more thorough toilet hygiene care.   Baseline unable   Time 8   Period Weeks   Status New   OT LONG TERM GOAL #3   Title Pt. will  be demonstrate energy conservation/work simplification techniques for IADL tasks 100% of the time.     Baseline unable   Time 8   Period Weeks   Status New   OT LONG TERM GOAL #4   Title Pt. will demonstrate safe shower transfers using DME with supervision   Baseline unable   Time 8   OT LONG TERM GOAL #5   Title Pt. will increase right grip strength by 3# to assist with opening jar, and holding utensils.   Baseline drops items from hand occassionally   Time 8   Period Weeks   Status New   Long Term Additional Goals   Additional Long Term Goals Yes   OT LONG TERM GOAL #6   Title Pt. will improve right hand coordination by 3 sec. to be able grasp and hold grooming items.    Baseline 1 min & 2sec. on the 9-hole peg test   Time 8   Period Weeks   Status New               Plan - 12/15/15 0920    Clinical Impression Statement Patient limited by pain with daily activities, she was able to implement the use of adaptive equipment over the last week successfully at home but needed additional modifications to sock aid for grip and use.  Added blue foam built up handles for each end of ropes on sockaid.  Patient asking about crutches to fit her size since she is shorter, may need a kid size but she feels the arms are not long enough on those crutches, will speak with PT regarding options. Patient continues to benefit from skilled OT to maximize her safety and independence in necessary daily tasks.    Rehab Potential Good   Clinical Impairments Affecting Rehab Potential Postive indicators: age; Negative Indicators: multiple  comorbidities   OT Frequency 1x / week   OT Duration 8 weeks   OT Treatment/Interventions Self-care/ADL training;Therapeutic exercises;Manual Therapy;DME and/or AE instruction;Therapeutic exercise;Patient/family education;Therapeutic activities   Consulted and Agree with Plan of Care Patient  Patient will benefit from skilled therapeutic intervention in order to improve the following deficits and impairments:  Decreased balance, Impaired UE functional use, Decreased endurance, Decreased strength, Decreased range of motion, Decreased coordination  Visit Diagnosis: Muscle weakness (generalized)  Other lack of coordination    Problem List Patient Active Problem List   Diagnosis Date Noted  . Gout 10/26/2015  . [facility] (congenital dislocation of the hip) 06/19/2015  . HLD (hyperlipidemia) 06/19/2015  . BP (high blood pressure) 06/19/2015  . Adiposity 06/19/2015  . Borderline diabetes mellitus 06/19/2015  . Obstructive apnea 06/19/2015  . H/O malignant neoplasm of thyroid 07/15/2014  . Hypothyroidism, postop 07/15/2014  . Degeneration of intervertebral disc of lumbar region 03/18/2013  . Fibromyalgia 03/18/2013  . LBP (low back pain) 03/18/2013   Katie Baker, OTR/L, CLT  Katie Baker 12/15/2015, 9:24 AM  [place] MAIN [facility] SERVICES [address] [place], [place], [address] Phone: [phone]   Fax:  [phone]  Name: Katie Baker MRN: [id number] Date of Birth: 1961/07/25

## 2015-12-18 ENCOUNTER — Ambulatory Visit: Payer: Medicare Other | Admitting: Physical Therapy

## 2015-12-22 ENCOUNTER — Ambulatory Visit: Payer: Medicare Other | Admitting: Occupational Therapy

## 2015-12-22 ENCOUNTER — Encounter: Payer: Medicare Other | Admitting: Physical Therapy

## 2015-12-27 ENCOUNTER — Encounter: Payer: Medicare Other | Admitting: Physical Therapy

## 2015-12-27 ENCOUNTER — Ambulatory Visit: Payer: Medicare Other | Admitting: Occupational Therapy

## 2015-12-28 ENCOUNTER — Ambulatory Visit: Payer: Medicare Other | Admitting: Occupational Therapy

## 2015-12-28 ENCOUNTER — Encounter: Payer: Medicare Other | Admitting: Physical Therapy

## 2015-12-28 ENCOUNTER — Encounter: Payer: Self-pay | Admitting: Occupational Therapy

## 2015-12-28 ENCOUNTER — Encounter: Payer: Medicare Other | Admitting: Occupational Therapy

## 2015-12-28 DIAGNOSIS — R279 Unspecified lack of coordination: Secondary | ICD-10-CM | POA: Diagnosis not present

## 2015-12-28 DIAGNOSIS — M6281 Muscle weakness (generalized): Secondary | ICD-10-CM

## 2015-12-28 NOTE — Therapy (Addendum)
[place] MAIN [facility] SERVICES [address] [place], [place], [address] Phone: [phone]   Fax:  [phone]  Occupational Therapy Treatment/Discharge Summary  Patient Details  Name: Katie Baker MRN: [id number] Date of Birth: 1960/11/30 Referring Provider: Dr. Rayann Heman  Encounter Date: 12/28/2015      OT End of Session - 12/28/15 1800    Visit Number 6   Number of Visits 16   Authorization Type G-code day 6 of 10   OT Start Time 1300   OT Stop Time 1350   OT Time Calculation (min) 50 min      Past Medical History  Diagnosis Date  . Thyroid cancer ([place])   . Reflux   . Hypothyroidism   . Hypertension   . Hyperlipidemia   . Obesity   . Dislocation of hip, congenital   . Fibromyalgia   . Sleep apnea   . Prediabetes   . Diabetes mellitus, type II ([place])   . Tendonitis   . Spinal stenosis     Past Surgical History  Procedure Laterality Date  . Colonoscopy with propofol N/A 02/09/2015    Procedure: COLONOSCOPY WITH PROPOFOL;  Surgeon: [name], MD;  Location: [facility] ENDOSCOPY;  Service: Endoscopy;  Laterality: N/A;  . Esophagogastroduodenoscopy N/A 02/09/2015    Procedure: ESOPHAGOGASTRODUODENOSCOPY (EGD);  Surgeon: [name], MD;  Location: [facility] ENDOSCOPY;  Service: Endoscopy;  Laterality: N/A;  . Thyroid surgery    . Hip surgery      There were no vitals filed for this visit.      Subjective Assessment - 12/28/15 1756    Subjective  Pt. reports that she is seeing a Psychiatrist. Pt. reports she will have to arrange to have spinal/neck surgery. She is meeting with the neurosurgeon next month.   Patient is accompained by: Family member   Pertinent History Pt. is a 55 y.o. female who presents with an extensive medical history which hinders her ability to perform  ADL and IADL tasks. This pt.currently resides at home alone, and relies on familiy to assist with ADLs, IADLs, and transportation.    Currently in Pain?  Yes   Pain Score 10-Worst pain ever             LYMPHEDEMA/ONCOLOGY QUESTIONNAIRE - 12/28/15 1049    Type   Cancer Type thyroid   What other symptoms do you have   Are you Having Heaviness or Tightness Yes   Are you having Pain Yes   Are you having pitting edema Yes   Is it Hard or Difficult finding clothes that fit Yes   Do you have infections No   Is there Decreased scar mobility No   Stemmer Sign Yes   Other Symptoms neuropathy B feet   Lymphedema Stage   Stage STAGE 2 SPONTANEOUSLY IRREVERSIBLE   Lymphedema Assessments   Lymphedema Assessments Lower extremities   Right Lower Extremity Lymphedema   Other TBA Rx visit 1. By visual assessment appears < 10% below knees, L>R       OT TREATMENT    Selfcare: Pt. education was provided at length for A/E training for LE ADLs, DME, home set-up. Pt. daily routines were reviewed with pt., and options for DME, and home modifications. Pt. Ed. Was provided about community resources. Pt. Would like only free home modification options. Pt. Is ready to pursue OT for lymphadema management.  OT Education - 12/28/15 1759    Education provided Yes   Education Details Pt. ed. was provided about ADLs and A/E, DME, home modification.   Person(s) Educated Patient   Methods Explanation             OT Long Term Goals - 12/29/15 1318    OT LONG TERM GOAL #1   Title Pt. will demonstrate independent A/E use for LE dressing.    Baseline unable   Time 8   Period Weeks   Status Partially Met   OT LONG TERM GOAL #2   Title Pt. will be independent with a toilet use for more thorough toilet hygiene care.   Baseline unable   Time 8   Period Weeks   Status Partially Met   OT LONG TERM GOAL #3   Title Pt. will  be able to demonstrate energy conservation/work simplification techniques for IADL tasks 100% of the time.     Baseline unable   Time 8   Period Weeks   Status Partially Met   OT LONG TERM  GOAL #4   Title Pt. will demonstrate safe shower transfers using DME with supervision   Baseline unable   Time 8   Period Weeks   Status Partially Met   OT LONG TERM GOAL #5   Title Pt. will increase right grip strength by 3# to assist with opening jar, and holding utensils.   Baseline drops items from hand occassionally   Time 8   Period Weeks   Status Not Met   OT LONG TERM GOAL #6   Title Pt. will improve right hand coordination by 3 sec. to be able grasp and hold grooming items.    Baseline 1 min & 2sec. on the 9-hole peg test   Time 8   Period Weeks   Status Not Met               Plan - 12/28/15 1801    Clinical Impression Statement Pt. reports she is now using the A/E  to assist with LE dressing including reacher, sockaide, and LH sponge. Pt. reports she needs home modification specialists to assess her home and make necessary modifications for free. Pt. is only open to free options, or services. Pt. was provided with education about various community resources.Pt. is planning to have surgery neck/back surgery soon, and has arranged an appointment with a neuro surgeon. Pt. is planning to go to rehab following the surgery, and then have home health services. Pt. is now appropriate for discharge from OT services for ADL retraining. Pt. is now continuing with OT for Lymphdema Management.   Rehab Potential Good   Clinical Impairments Affecting Rehab Potential Postive indicators: age; Negative Indicators: multiple comorbidities   OT Frequency 1x / week   OT Duration 8 weeks   OT Treatment/Interventions Therapeutic exercises;Manual Therapy;DME and/or AE instruction;Patient/family education;Therapeutic activities;Manual lymph drainage;Compression bandaging;Other (comment);Self-care/ADL training   Consulted and Agree with Plan of Care Patient      Patient will benefit from skilled therapeutic intervention in order to improve the following deficits and impairments:     Visit  Diagnosis: Muscle weakness (generalized)    Problem List Patient Active Problem List   Diagnosis Date Noted  . Gout 10/26/2015  . [facility] (congenital dislocation of the hip) 06/19/2015  . HLD (hyperlipidemia) 06/19/2015  . BP (high blood pressure) 06/19/2015  . Adiposity 06/19/2015  . Borderline diabetes mellitus 06/19/2015  . Obstructive apnea 06/19/2015  . H/O malignant neoplasm  of thyroid 07/15/2014  . Hypothyroidism, postop 07/15/2014  . Degeneration of intervertebral disc of lumbar region 03/18/2013  . Fibromyalgia 03/18/2013  . LBP (low back pain) 03/18/2013   [name], MS, OTR/L   [name] 12/29/2015, 1:32 PM  [place] MAIN [facility] SERVICES [address] [place], [place], [address] Phone: [phone]   Fax:  [phone]  Name: Katie Baker MRN: [id number] Date of Birth: 1960-12-19

## 2015-12-28 NOTE — Therapy (Signed)
[place] MAIN [facility] SERVICES [address] [place], [place], [address] Phone: [phone]   Fax:  [phone]  Occupational Therapy Evaluation : Lymphedema Care  Patient Details  Name: Katie Baker MRN: [id number] Date of Birth: September 08, 1960 Referring Provider: Dr. Rayann Heman  Encounter Date: 12/14/2015    Past Medical History  Diagnosis Date  . Thyroid cancer ([place])   . Reflux   . Hypothyroidism   . Hypertension   . Hyperlipidemia   . Obesity   . Dislocation of hip, congenital   . Fibromyalgia   . Sleep apnea   . Prediabetes   . Diabetes mellitus, type II ([place])   . Tendonitis   . Spinal stenosis     Past Surgical History  Procedure Laterality Date  . Colonoscopy with propofol N/A 02/09/2015    Procedure: COLONOSCOPY WITH PROPOFOL;  Surgeon: [name], MD;  Location: [facility] ENDOSCOPY;  Service: Endoscopy;  Laterality: N/A;  . Esophagogastroduodenoscopy N/A 02/09/2015    Procedure: ESOPHAGOGASTRODUODENOSCOPY (EGD);  Surgeon: [name], MD;  Location: [facility] ENDOSCOPY;  Service: Endoscopy;  Laterality: N/A;  . Thyroid surgery    . Hip surgery      Filed Vitals:   12/14/15 1058  Height: 4\' 8"  (1.422 m)  Weight: 175 lb (79.379 kg)        Subjective Assessment - 12/28/15 1024    Subjective  Patient is referred for OT evaluation and treatment of BLE lymphedema and associated sensory symptoms in [facility] by Elmyra Ricks, MD. Pt is currently seeing OT at same clinic for B/I ADL training, including adaptive equipment. Pt reports ongoing leg swelling that  improved with elevation, but that does not resolve over night. Pt reports  sensory discomfort in legs including heaviness, fullness, tightness and dull aches. Pt also reports "lumpy areas" in volar arms bilaterally.    Pertinent History Pt. is a 55 y.o. female who presents with an extensive medical history which hinders her ability to perform  ADL and IADL tasks. This pt.currently  resides at home alone, and relies on familiy to assist with ADLs, IADLs, and transportation.    Limitations difficulty stnding and walking, decreased balance, hx frequent falls, difficulty fitting street shoes 2/2 leg swelling, chronic pain, decreased BLE AROM, decreased sensation in feet ( neuropathy), body assymetry,    Patient Stated Goals to decrease leg swelling and pain   Currently in Pain? Yes   Pain Score 10-Worst pain ever   Pain Location --  all over   Pain Descriptors / Indicators Aching;Burning;Constant;Discomfort;Dull;Heaviness;Numbness;Pins and needles;Pressure;Sore;Tender;Throbbing;Tightness   Pain Type Chronic pain;Intractable pain   Pain Onset More than a month ago   Pain Frequency Constant   Multiple Pain Sites Yes           [facility] OT Assessment - 12/28/15 0001    Assessment   Diagnosis BLE lymphedema- mild stage 2   Onset Date 11/12/85   Prior Therapy no   Precautions   Precautions None   Precaution Comments HYPOTHYROID, FALLS, DIABETES SKIN   Balance Screen   Has the patient fallen in the past 6 months Yes   Has the patient had a decrease in activity level because of a fear of falling?  Yes   Is the patient reluctant to leave their home because of a fear of falling?  Yes   Home  Environment   Family/patient expects to be discharged to: Private residence   Living Arrangements Alone   Available Help at Discharge Family  Type of Home House   Home Access Stairs   Home Layout Two level   Alternate Level Stairs - Number of Steps 15   Bathroom Shower/Tub Tub only;Gaffer;Door   Biochemist, clinical No   Adaptive equipment Other (comment)   [place] - single point;Walker - 4 wheels;Electric scooter;Wheelchair - manual   Prior Function   Level of Independence Independent   Vocation On disability;Retired   ADL   Eating/Feeding Surveyor, mining Increased time;Min guard    Lower Body Dressing Moderate assistance   Transfers/Ambulation Related to ADL's CGA   ADL comments Pt. reports that she drops objects from her hands occassionally during feeding, and grooming tasks.   IADL   Shopping Needs to be accompanied on any shopping trip;Assistance for transportation   Prior Level of Function Light Housekeeping Pt. has difficulty completing.   Light Housekeeping Needs help with all home maintenance tasks   Medication Management Is responsible for taking medication in correct dosages at correct time;Takes responsibility if medication is prepared in advance in seperate dosage   Financial Management Manages financial matters independently (budgets, writes checks, pays rent, bills goes to bank), collects and keeps track of income   Observation/Other Assessments   Skin Integrity / soft suspected lipomas bilaterally. No arm swelling observed          LYMPHEDEMA/ONCOLOGY QUESTIONNAIRE - 12/28/15 1049    Type   Cancer Type thyroid   What other symptoms do you have   Are you Having Heaviness or Tightness Yes   Are you having Pain Yes   Are you having pitting edema Yes   Is it Hard or Difficult finding clothes that fit Yes   Do you have infections No   Is there Decreased scar mobility No   Stemmer Sign Yes   Other Symptoms neuropathy B feet   Lymphedema Stage   Stage STAGE 2 SPONTANEOUSLY IRREVERSIBLE   Lymphedema Assessments   Lymphedema Assessments Lower extremities   Right Lower Extremity Lymphedema   Other TBA Rx visit 1. By visual assessment appears < 10% below knees, L>R                       OT Education - 12/28/15 1051    Education provided Yes   Education Details Provided Pt/caregiver skilled education and ADL training throughout visit for lymphedema etiology, progression, and treatment including Intensive and Management Phase Complete Decongestive Therapy (CDT)  Discussed lymphedema precautions, cellulitis risk, and all CDT and LE  self-care components, including compression wrapping/ garments & devices, lymphatic pumping ther ex, simple self-MLD, and skin care. Provided printed Lymphedema Workbook for reference.   Person(s) Educated Patient;Child(ren)   Methods Explanation;Handout   Comprehension Verbalized understanding;Need further instruction             OT Long Term Goals - 12/28/15 1208    OT LONG TERM GOAL #1   Title Pt. will demonstrate independent A/E use for LE dressing.    Baseline unable   Time 8   Period Weeks   Status On-going   OT LONG TERM GOAL #2   Title Pt. will be independent with a toilet use for more thorough toilet hygiene care.   Baseline unable   Time 8   Period Weeks   Status On-going   OT LONG TERM GOAL #3   Title Pt. will  be demonstrate energy conservation/work simplification techniques for IADL  tasks 100% of the time.     Baseline unable   Time 8   Period Weeks   Status On-going   OT LONG TERM GOAL #4   Title Pt. will demonstrate safe shower transfers using DME with supervision   Baseline unable   Time 8   Status On-going   OT LONG TERM GOAL #5   Title Pt. will increase right grip strength by 3# to assist with opening jar, and holding utensils.   Baseline drops items from hand occassionally   Time 8   Period Weeks   Status On-going   Long Term Additional Goals   Additional Long Term Goals Yes   OT LONG TERM GOAL #6   Title Pt. will improve right hand coordination by 3 sec. to be able grasp and hold grooming items.    Baseline 1 min & 2sec. on the 9-hole peg test   Time 8   Period Weeks   Status On-going   OT LONG TERM GOAL #7   Title Lymphedema (LE) self-care: Pt able to apply thigh-length, multi layered, gradient compression wraps with maximum caregiver assistance within 2 weeks to achieve optimal limb volume reduction.   Baseline dependent   Time 2   Period Weeks   Status New   OT LONG TERM GOAL #8   Title Lymphedema care:  Pt to achieve at least 10% BLE  limb volume reductions during Intensive CDT to limit LE progression, decrease infection risk and fall risk, to reduce pain/ discomfort, and to improve safe ambulation and functional mobility.   Baseline dependent   Time 8   Period Weeks   OT LONG TERM GOAL  #9   Baseline Lymphedema care: Pt >/= 85 % compliant with all daily, LE self-care protocols for home program, including simple self-manual lymphatic drainage (MLD), skin care, lymphatic pumping the ex, skin care, and donning/ doffing compression wraps and garments with needed level of caregiver assistance to limit LE progression and further functional decline. (Baseline: dependent)     Time 8   Period Weeks   Status New   OT LONG TERM GOAL  #10   TITLE Lymphedema care: Pt to tolerate compression garments and devices in keeping w/ prescribed wear regime within 1 week of issue date to retain clinical and functional gains and to limit progression.   Baseline dependent   Time 8   Period Weeks   Status New               Plan - 12/28/15 1057    Clinical Impression Statement Pt presents with mildly fibrotic, stage 2, BLE lymphadema w/  swelling concentrated primarily below the knees. L>R. Pt  reports insideous onset several years ago without known precipitating event/s. Pt also c/o  typiically associated sensory discomfort and pain, including heaviness, fullness, tightness, and pain in legs. Lymphedema limits Pt's ability to perform lower body dressing, limits functional mobility and ambulation, and limits participation in productive, leisure and social activities requiring standing, walking and extended sitting. Skilled Occupational Therapy for LE care is medically necessary to reduce uncontrolled swelling and associated pain/ discomfort, to decrease infection and falls risk, to improve knowledge and performance of LE self-care, to fit with appropriate, accessible, custom, BLE compression garments and devices, and to limit further progression.  Without skilled Occupational Therapy for Intensive and Management phase Complete Decongestive Therapy (CDT) this patient's condition is likely to worsen and further functional decline is expected.   Rehab Potential Good   Clinical Impairments Affecting  Rehab Potential Postive indicators: age; Negative Indicators: multiple comorbidities   OT Frequency 1x / week   OT Duration 8 weeks   OT Treatment/Interventions Therapeutic exercises;Manual Therapy;DME and/or AE instruction;Patient/family education;Therapeutic activities;Manual lymph drainage;Compression bandaging;Other (comment);Self-care/ADL training  skin care   Plan fit w BLE knee high compression garments. Consider adjustable alternatives w/ velcro vs traditional elastic PRN   OT Home Exercise Plan 2 x q day BLE lymphatic pumping ther ex seated or supine, 10 reps each w/ 15-30 second hold   Recommended Other Services cont PT and OT ADL training   Consulted and Agree with Plan of Care Patient      Patient will benefit from skilled therapeutic intervention in order to improve the following deficits and impairments:  Decreased range of motion, Decreased knowledge of use of DME, Decreased skin integrity, Increased edema, Pain, Impaired flexibility, Decreased mobility, Impaired sensation, Decreased endurance, Decreased balance, Decreased knowledge of precautions, Difficulty walking, Impaired perceived functional ability  Visit Diagnosis: Lymphedema, not elsewhere classified - Plan: Ot plan of care cert/re-cert    Problem List Patient Active Problem List   Diagnosis Date Noted  . Gout 10/26/2015  . [facility] (congenital dislocation of the hip) 06/19/2015  . HLD (hyperlipidemia) 06/19/2015  . BP (high blood pressure) 06/19/2015  . Adiposity 06/19/2015  . Borderline diabetes mellitus 06/19/2015  . Obstructive apnea 06/19/2015  . H/O malignant neoplasm of thyroid 07/15/2014  . Hypothyroidism, postop 07/15/2014  . Degeneration of intervertebral  disc of lumbar region 03/18/2013  . Fibromyalgia 03/18/2013  . LBP (low back pain) 03/18/2013   Andrey Spearman, MS, OTR/L, [facility] 12/28/2015 1:16 PM   [place] MAIN [facility] SERVICES [address] [place], [place], [address] Phone: [phone]   Fax:  [phone]  Name: Katie Baker MRN: [id number] Date of Birth: [birth year]

## 2015-12-28 NOTE — Patient Instructions (Signed)
OT TREATMENT    Selfcare: Pt. education was provided at length for A/E training for LE ADLs, DME, home set-up. Pt. daily routines were reviewed with pt., and options for DME, and home modifications. Pt. Ed. Was provided about community resources. Pt. Would like only free home modification options. Pt. Is ready to pursue OT for lymphadema management.

## 2015-12-28 NOTE — Patient Instructions (Signed)

## 2016-01-03 ENCOUNTER — Ambulatory Visit: Payer: Medicare Other | Admitting: Occupational Therapy

## 2016-01-03 ENCOUNTER — Ambulatory Visit: Payer: Medicare Other | Attending: Gastroenterology | Admitting: Occupational Therapy

## 2016-01-03 DIAGNOSIS — I890 Lymphedema, not elsewhere classified: Secondary | ICD-10-CM | POA: Diagnosis present

## 2016-01-03 DIAGNOSIS — R278 Other lack of coordination: Secondary | ICD-10-CM | POA: Insufficient documentation

## 2016-01-03 DIAGNOSIS — R279 Unspecified lack of coordination: Secondary | ICD-10-CM | POA: Insufficient documentation

## 2016-01-03 DIAGNOSIS — R262 Difficulty in walking, not elsewhere classified: Secondary | ICD-10-CM | POA: Diagnosis present

## 2016-01-03 NOTE — Patient Instructions (Signed)
LE instructions and precautions as established- see initial eval.   

## 2016-01-03 NOTE — Therapy (Signed)
[place] MAIN [facility] SERVICES [place], [place], [address] Phone: [phone]   Fax:  [phone]  Occupational Therapy Treatment  Patient Details  Name: Katie Baker MRN: [id number] Date of Birth: 1960-12-26 Referring Provider: Dr. Rayann Heman  Encounter Date: 01/03/2016      OT End of Session - 01/03/16 1230    Visit Number 7   Number of Visits 16   Date for OT Re-Evaluation 03/14/16   Authorization Type G-code day 6 of 10   OT Start Time 1006   OT Stop Time 1119   OT Time Calculation (min) 73 min      Past Medical History  Diagnosis Date  . Thyroid cancer ([place])   . Reflux   . Hypothyroidism   . Hypertension   . Hyperlipidemia   . Obesity   . Dislocation of hip, congenital   . Fibromyalgia   . Sleep apnea   . Prediabetes   . Diabetes mellitus, type II ([place])   . Tendonitis   . Spinal stenosis     Past Surgical History  Procedure Laterality Date  . Colonoscopy with propofol N/A 02/09/2015    Procedure: COLONOSCOPY WITH PROPOFOL;  Surgeon: [name], MD;  Location: [facility] ENDOSCOPY;  Service: Endoscopy;  Laterality: N/A;  . Esophagogastroduodenoscopy N/A 02/09/2015    Procedure: ESOPHAGOGASTRODUODENOSCOPY (EGD);  Surgeon: [name], MD;  Location: [facility] ENDOSCOPY;  Service: Endoscopy;  Laterality: N/A;  . Thyroid surgery    . Hip surgery      There were no vitals filed for this visit.      Subjective Assessment - 01/03/16 1016    Subjective  Pt. presents for OT visit 2 to address B lower and upper quadrant limb swelling and pain. Pt is accompanied by her daughter today. Pt has no new complaints. She is nervous about upcoming appointment with back surgeon at [facility] re possible surgical intervention for spinal stenosis.   Patient is accompained by: Family member   Pertinent History Pt. is a 55 y.o. female who presents with an extensive medical history which hinders her ability to perform  ADL and IADL  tasks. This pt.currently resides at home alone, and relies on familiy to assist with ADLs, IADLs, and transportation.    Limitations difficulty stnding and walking, decreased balance, hx frequent falls, difficulty fitting street shoes 2/2 leg swelling, chronic pain, decreased BLE AROM, decreased sensation in feet ( neuropathy), body assymetry,    Patient Stated Goals to decrease leg swelling and pain   Currently in Pain? Yes   Pain Score 7    Pain Location Other (Comment)  neck, back, legs, arms   Pain Orientation Right;Left   Pain Type Intractable pain   Pain Onset More than a month ago                              OT Education - 01/03/16 1228    Education provided Yes   Education Details Emphasis of Pt edu todsay for LE self care on functional lymphatic anatomy and rational for MLD to decrease painful swelling 2/2 inflamation   Person(s) Educated Patient;Child(ren)   Comprehension Verbalized understanding;Need further instruction             OT Long Term Goals - 12/29/15 1318    OT LONG TERM GOAL #1   Title Pt. will demonstrate independent A/E use for LE dressing.    Baseline unable  Time 8   Period Weeks   Status Partially Met   OT LONG TERM GOAL #2   Title Pt. will be independent with a toilet use for more thorough toilet hygiene care.   Baseline unable   Time 8   Period Weeks   Status Partially Met   OT LONG TERM GOAL #3   Title Pt. will  be able to demonstrate energy conservation/work simplification techniques for IADL tasks 100% of the time.     Baseline unable   Time 8   Period Weeks   Status Partially Met   OT LONG TERM GOAL #4   Title Pt. will demonstrate safe shower transfers using DME with supervision   Baseline unable   Time 8   Period Weeks   Status Partially Met   OT LONG TERM GOAL #5   Title Pt. will increase right grip strength by 3# to assist with opening jar, and holding utensils.   Baseline drops items from hand  occassionally   Time 8   Period Weeks   Status Not Met   OT LONG TERM GOAL #6   Title Pt. will improve right hand coordination by 3 sec. to be able grasp and hold grooming items.    Baseline 1 min & 2sec. on the 9-hole peg test   Time 8   Period Weeks   Status Not Met               Plan - 01/03/16 1232    Clinical Impression Statement Pt tolerated MLD to neck, B arms and LLE without difficulty. Swelling at arm nodules palpably decreased after manual therapy. Cont as per POC. Commence skilled training for simple self MLD next session for Pt and caregiver.   Rehab Potential Good   Clinical Impairments Affecting Rehab Potential Postive indicators: age; Negative Indicators: multiple comorbidities   OT Frequency 1x / week   OT Duration 8 weeks   OT Treatment/Interventions Therapeutic exercises;Manual Therapy;DME and/or AE instruction;Patient/family education;Therapeutic activities;Manual lymph drainage;Compression bandaging;Other (comment);Self-care/ADL training  skin care   OT Home Exercise Plan 2 x q day BLE lymphatic pumping ther ex seated or supine, 10 reps each w/ 15-30 second hold   Consulted and Agree with Plan of Care Patient      Patient will benefit from skilled therapeutic intervention in order to improve the following deficits and impairments:  Decreased range of motion, Decreased knowledge of use of DME, Decreased skin integrity, Increased edema, Pain, Impaired flexibility, Decreased mobility, Impaired sensation, Decreased endurance, Decreased balance, Decreased knowledge of precautions, Difficulty walking, Impaired perceived functional ability  Visit Diagnosis: Lymphedema, not elsewhere classified    Problem List Patient Active Problem List   Diagnosis Date Noted  . Gout 10/26/2015  . [facility] (congenital dislocation of the hip) 06/19/2015  . HLD (hyperlipidemia) 06/19/2015  . BP (high blood pressure) 06/19/2015  . Adiposity 06/19/2015  . Borderline diabetes  mellitus 06/19/2015  . Obstructive apnea 06/19/2015  . H/O malignant neoplasm of thyroid 07/15/2014  . Hypothyroidism, postop 07/15/2014  . Degeneration of intervertebral disc of lumbar region 03/18/2013  . Fibromyalgia 03/18/2013  . LBP (low back pain) 03/18/2013   Andrey Spearman, MS, OTR/L, [facility] 01/03/2016 12:34 PM  [place] MAIN [facility] SERVICES [address] [place], [place], [address] Phone: [phone]   Fax:  [phone]  Name: Katie Baker MRN: [id number] Date of Birth: 08/11/1961

## 2016-01-10 ENCOUNTER — Ambulatory Visit: Payer: Medicare Other | Admitting: Occupational Therapy

## 2016-01-10 DIAGNOSIS — I890 Lymphedema, not elsewhere classified: Secondary | ICD-10-CM

## 2016-01-10 NOTE — Patient Instructions (Signed)

## 2016-01-10 NOTE — Therapy (Signed)
[place] MAIN [facility] SERVICES [address] [place], [place], [address] Phone: [phone]   Fax:  [phone]  Occupational Therapy Treatment  Patient Details  Name: Katie Baker MRN: [id number] Date of Birth: 04/20/1961 Referring Provider: Dr. Rayann Heman  Encounter Date: 01/10/2016      OT End of Session - 01/10/16 1704    Visit Number 8   Number of Visits 16   Date for OT Re-Evaluation 03/14/16   Authorization Type G-code day 8 of 10   OT Start Time 0300   OT Stop Time 0406   OT Time Calculation (min) 66 min   Activity Tolerance Patient tolerated treatment well;No increased pain   Behavior During Therapy [facility] for tasks assessed/performed      Past Medical History  Diagnosis Date  . Thyroid cancer ([place])   . Reflux   . Hypothyroidism   . Hypertension   . Hyperlipidemia   . Obesity   . Dislocation of hip, congenital   . Fibromyalgia   . Sleep apnea   . Prediabetes   . Diabetes mellitus, type II ([place])   . Tendonitis   . Spinal stenosis     Past Surgical History  Procedure Laterality Date  . Colonoscopy with propofol N/A 02/09/2015    Procedure: COLONOSCOPY WITH PROPOFOL;  Surgeon: [name], MD;  Location: [facility] ENDOSCOPY;  Service: Endoscopy;  Laterality: N/A;  . Esophagogastroduodenoscopy N/A 02/09/2015    Procedure: ESOPHAGOGASTRODUODENOSCOPY (EGD);  Surgeon: [name], MD;  Location: [facility] ENDOSCOPY;  Service: Endoscopy;  Laterality: N/A;  . Thyroid surgery    . Hip surgery      There were no vitals filed for this visit.      Subjective Assessment - 01/10/16 1657    Subjective  Pt. presents for OT visit 3 to address B lower and upper quadrant limb swelling and pain. Pt is accompanied by her daughter today. Pt has no new complaints. She reports she got mild pain releif in arms for several hours after manula lymphatic drainage to arms last session.   Patient is accompained by: Family member   Pertinent  History Pt. is a 55 y.o. female who presents with an extensive medical history which hinders her ability to perform  ADL and IADL tasks. This pt.currently resides at home alone, and relies on familiy to assist with ADLs, IADLs, and transportation.    Limitations difficulty stnding and walking, decreased balance, hx frequent falls, difficulty fitting street shoes 2/2 leg swelling, chronic pain, decreased BLE AROM, decreased sensation in feet ( neuropathy), body assymetry,    Patient Stated Goals to decrease leg swelling and pain   Currently in Pain? Yes   Pain Score --  not rated- no change in chronic intractable pain   Pain Onset More than a month ago                      OT Treatments/Exercises (OP) - 01/10/16 0001    ADLs   ADL Education Given Yes   Manual Therapy   Manual Therapy Edema management;Manual Lymphatic Drainage (MLD)   Edema Management scar massage   Myofascial Release --   Manual Lymphatic Drainage (MLD) Manual lymph drainage in supine: short neck, right axillary nodes, superficial and deep abdominals, right inguino-axillary anastamoses, right lower extremity from toes and dorsal foot to lateral hip redirecting along pathway.                OT Education -  01/10/16 1700    Education provided Yes   Education Details Continued skilled Pt/caregiver Education  And LE ADL training throughout visit for lymphedema self care, including compression wrapping, compression garment and device wear/care, lymphatic pumping ther ex, simple self-MLD, and skin care. Discussed progress towards goals.   Person(s) Educated Patient   Methods Explanation;Demonstration;Handout   Comprehension Verbalized understanding;Need further instruction             OT Long Term Goals - 12/29/15 1318    OT LONG TERM GOAL #1   Title Pt. will demonstrate independent A/E use for LE dressing.    Baseline unable   Time 8   Period Weeks   Status Partially Met   OT LONG TERM GOAL #2    Title Pt. will be independent with a toilet use for more thorough toilet hygiene care.   Baseline unable   Time 8   Period Weeks   Status Partially Met   OT LONG TERM GOAL #3   Title Pt. will  be able to demonstrate energy conservation/work simplification techniques for IADL tasks 100% of the time.     Baseline unable   Time 8   Period Weeks   Status Partially Met   OT LONG TERM GOAL #4   Title Pt. will demonstrate safe shower transfers using DME with supervision   Baseline unable   Time 8   Period Weeks   Status Partially Met   OT LONG TERM GOAL #5   Title Pt. will increase right grip strength by 3# to assist with opening jar, and holding utensils.   Baseline drops items from hand occassionally   Time 8   Period Weeks   Status Not Met   OT LONG TERM GOAL #6   Title Pt. will improve right hand coordination by 3 sec. to be able grasp and hold grooming items.    Baseline 1 min & 2sec. on the 9-hole peg test   Time 8   Period Weeks   Status Not Met               Plan - 01/10/16 1705    Clinical Impression Statement MLD last session provided mild, temporary relieve from pain in region of antecubital fossa bilaterally where lymph nodes typically reside. Pt tolerated RLE MLD today in both supported sidelying and supine w/ knee flexion and head elevation on 2 pillows. Pt needed min A to reposition on bed.    Rehab Potential Good   Clinical Impairments Affecting Rehab Potential Postive indicators: age; Negative Indicators: multiple comorbidities   OT Frequency 1x / week   OT Duration 8 weeks   OT Treatment/Interventions Therapeutic exercises;Manual Therapy;DME and/or AE instruction;Patient/family education;Therapeutic activities;Manual lymph drainage;Compression bandaging;Other (comment);Self-care/ADL training  skin care   OT Home Exercise Plan 2 x q day BLE lymphatic pumping ther ex seated or supine, 10 reps each w/ 15-30 second hold   Consulted and Agree with Plan of Care  Patient      Patient will benefit from skilled therapeutic intervention in order to improve the following deficits and impairments:  Decreased range of motion, Decreased knowledge of use of DME, Decreased skin integrity, Increased edema, Pain, Impaired flexibility, Decreased mobility, Impaired sensation, Decreased endurance, Decreased balance, Decreased knowledge of precautions, Difficulty walking, Impaired perceived functional ability  Visit Diagnosis: Lymphedema, not elsewhere classified    Problem List Patient Active Problem List   Diagnosis Date Noted  . Gout 10/26/2015  . [facility] (congenital dislocation of the hip) 06/19/2015  .  HLD (hyperlipidemia) 06/19/2015  . BP (high blood pressure) 06/19/2015  . Adiposity 06/19/2015  . Borderline diabetes mellitus 06/19/2015  . Obstructive apnea 06/19/2015  . H/O malignant neoplasm of thyroid 07/15/2014  . Hypothyroidism, postop 07/15/2014  . Degeneration of intervertebral disc of lumbar region 03/18/2013  . Fibromyalgia 03/18/2013  . LBP (low back pain) 03/18/2013    Andrey Spearman, MS, OTR/L, [facility] 01/10/2016 5:08 PM  [place] MAIN [facility] SERVICES [address] [place], [place], [address] Phone: [phone]   Fax:  [phone]  Name: Katie Baker MRN: [id number] Date of Birth: [birth year]

## 2016-01-11 ENCOUNTER — Ambulatory Visit: Payer: Medicare Other | Attending: Neurology

## 2016-01-11 ENCOUNTER — Ambulatory Visit: Payer: Medicare Other | Admitting: Physical Therapy

## 2016-01-11 DIAGNOSIS — G4733 Obstructive sleep apnea (adult) (pediatric): Secondary | ICD-10-CM | POA: Insufficient documentation

## 2016-01-17 ENCOUNTER — Ambulatory Visit: Payer: Medicare Other | Admitting: Occupational Therapy

## 2016-01-17 DIAGNOSIS — I890 Lymphedema, not elsewhere classified: Secondary | ICD-10-CM

## 2016-01-17 NOTE — Therapy (Signed)
[place] MAIN [facility] SERVICES [place], [place], [address] Phone: [phone]   Fax:  [phone]  Occupational Therapy Treatment  Patient Details  Name: Katie Baker MRN: [id number] Date of Birth: 10-Aug-1961 Referring Provider: Dr. Rayann Heman  Encounter Date: 01/17/2016      OT End of Session - 01/17/16 0900    Visit Number 9   Number of Visits 16   Date for OT Re-Evaluation 03/14/16   Authorization Type G-code day 9 of 10   OT Start Time 0211   OT Stop Time 0313   OT Time Calculation (min) 62 min   Activity Tolerance Patient tolerated treatment well;No increased pain   Behavior During Therapy [facility] for tasks assessed/performed      Past Medical History  Diagnosis Date  . Thyroid cancer ([place])   . Reflux   . Hypothyroidism   . Hypertension   . Hyperlipidemia   . Obesity   . Dislocation of hip, congenital   . Fibromyalgia   . Sleep apnea   . Prediabetes   . Diabetes mellitus, type II ([place])   . Tendonitis   . Spinal stenosis     Past Surgical History  Procedure Laterality Date  . Colonoscopy with propofol N/A 02/09/2015    Procedure: COLONOSCOPY WITH PROPOFOL;  Surgeon: [name], MD;  Location: [facility] ENDOSCOPY;  Service: Endoscopy;  Laterality: N/A;  . Esophagogastroduodenoscopy N/A 02/09/2015    Procedure: ESOPHAGOGASTRODUODENOSCOPY (EGD);  Surgeon: [name], MD;  Location: [facility] ENDOSCOPY;  Service: Endoscopy;  Laterality: N/A;  . Thyroid surgery    . Hip surgery      There were no vitals filed for this visit.      Subjective Assessment - 01/17/16 1625    Subjective  Pt. presents for OT visit 4 to address B lower and upper quadrant limb swelling and pain.  Pt has no new complaints. Pt requests concentration on LE rx today.   Patient is accompained by: Family member   Pertinent History Pt. is a 55 y.o. female who presents with an extensive medical history which hinders her ability to perform  ADL  and IADL tasks. This pt.currently resides at home alone, and relies on familiy to assist with ADLs, IADLs, and transportation.    Limitations difficulty stnding and walking, decreased balance, hx frequent falls, difficulty fitting street shoes 2/2 leg swelling, chronic pain, decreased BLE AROM, decreased sensation in feet ( neuropathy), body assymetry,    Patient Stated Goals to decrease leg swelling and pain   Currently in Pain? Yes   Pain Score --  not rated numerically   Pain Type Chronic pain   Pain Onset More than a month ago                      OT Treatments/Exercises (OP) - 01/17/16 0001    ADLs   ADL Education Given Yes   Manual Therapy   Manual Therapy Edema management;Manual Lymphatic Drainage (MLD)   Edema Management scar massage   Manual Lymphatic Drainage (MLD) Manual lymph drainage to RLE in supine and sidelying short neck, right axillary nodes, superficial and deep abdominals, right inguino-axillary anastamoses, right lower extremity from toes and dorsal foot to lateral hip redirecting along pathway.                OT Education - 01/17/16 1628    Education provided Yes   Education Details Continued skilled Pt/caregiver Education  And LE  ADL training throughout visit for lymphedema self care, including compression wrapping, compression garment and device wear/care, lymphatic pumping ther ex, simple self-MLD, and skin care. Discussed progress towards goals.   Person(s) Educated Patient   Methods Explanation;Demonstration   Comprehension Verbalized understanding;Need further instruction             OT Long Term Goals - 12/29/15 1318    OT LONG TERM GOAL #1   Title Pt. will demonstrate independent A/E use for LE dressing.    Baseline unable   Time 8   Period Weeks   Status Partially Met   OT LONG TERM GOAL #2   Title Pt. will be independent with a toilet use for more thorough toilet hygiene care.   Baseline unable   Time 8   Period Weeks    Status Partially Met   OT LONG TERM GOAL #3   Title Pt. will  be able to demonstrate energy conservation/work simplification techniques for IADL tasks 100% of the time.     Baseline unable   Time 8   Period Weeks   Status Partially Met   OT LONG TERM GOAL #4   Title Pt. will demonstrate safe shower transfers using DME with supervision   Baseline unable   Time 8   Period Weeks   Status Partially Met   OT LONG TERM GOAL #5   Title Pt. will increase right grip strength by 3# to assist with opening jar, and holding utensils.   Baseline drops items from hand occassionally   Time 8   Period Weeks   Status Not Met   OT LONG TERM GOAL #6   Title Pt. will improve right hand coordination by 3 sec. to be able grasp and hold grooming items.    Baseline 1 min & 2sec. on the 9-hole peg test   Time 8   Period Weeks   Status Not Met               Plan - 01/17/16 1630    Clinical Impression Statement Pt reports short term analgesic effect for leg sensory discomfort  for several hhours after MLD. Swelling visibly decreased after session today. Review progress towards goals w/ Pt next session.   Rehab Potential Good   Clinical Impairments Affecting Rehab Potential Postive indicators: age; Negative Indicators: multiple comorbidities   OT Frequency 1x / week   OT Duration 8 weeks   OT Treatment/Interventions Therapeutic exercises;Manual Therapy;DME and/or AE instruction;Patient/family education;Therapeutic activities;Manual lymph drainage;Compression bandaging;Other (comment);Self-care/ADL training  skin care   OT Home Exercise Plan 2 x q day BLE lymphatic pumping ther ex seated or supine, 10 reps each w/ 15-30 second hold   Consulted and Agree with Plan of Care Patient      Patient will benefit from skilled therapeutic intervention in order to improve the following deficits and impairments:  Decreased range of motion, Decreased knowledge of use of DME, Decreased skin integrity,  Increased edema, Pain, Impaired flexibility, Decreased mobility, Impaired sensation, Decreased endurance, Decreased balance, Decreased knowledge of precautions, Difficulty walking, Impaired perceived functional ability  Visit Diagnosis: Lymphedema, not elsewhere classified    Problem List Patient Active Problem List   Diagnosis Date Noted  . Gout 10/26/2015  . [facility] (congenital dislocation of the hip) 06/19/2015  . HLD (hyperlipidemia) 06/19/2015  . BP (high blood pressure) 06/19/2015  . Adiposity 06/19/2015  . Borderline diabetes mellitus 06/19/2015  . Obstructive apnea 06/19/2015  . H/O malignant neoplasm of thyroid 07/15/2014  . Hypothyroidism,  postop 07/15/2014  . Degeneration of intervertebral disc of lumbar region 03/18/2013  . Fibromyalgia 03/18/2013  . LBP (low back pain) 03/18/2013    Andrey Spearman, MS, OTR/L, [facility] 01/17/2016 4:33 PM   [place] MAIN [facility] SERVICES [address] [place], [place], [address] Phone: [phone]   Fax:  [phone]  Name: Katie Baker MRN: [id number] Date of Birth: 11-09-1960

## 2016-01-17 NOTE — Patient Instructions (Signed)
LE instructions and precautions as established- see initial eval.   

## 2016-01-18 ENCOUNTER — Ambulatory Visit: Payer: Medicare Other | Admitting: Physical Therapy

## 2016-01-22 ENCOUNTER — Ambulatory Visit: Payer: Medicare Other | Admitting: Occupational Therapy

## 2016-01-23 ENCOUNTER — Ambulatory Visit: Payer: Medicare Other | Admitting: Physical Therapy

## 2016-01-23 DIAGNOSIS — I890 Lymphedema, not elsewhere classified: Secondary | ICD-10-CM | POA: Diagnosis not present

## 2016-01-23 DIAGNOSIS — R279 Unspecified lack of coordination: Secondary | ICD-10-CM

## 2016-01-23 DIAGNOSIS — R262 Difficulty in walking, not elsewhere classified: Secondary | ICD-10-CM

## 2016-01-23 DIAGNOSIS — R278 Other lack of coordination: Secondary | ICD-10-CM

## 2016-01-24 ENCOUNTER — Ambulatory Visit: Payer: Medicare Other | Admitting: Occupational Therapy

## 2016-01-24 NOTE — Therapy (Signed)
[place] MAIN [facility] SERVICES [address] [place], [place], [address] Phone: [phone]   Fax:  [phone]  Physical Therapy Treatment  Patient Details  Name: Katie Baker MRN: [id number] Date of Birth: [birth year] Referring Provider: Arther Dames MD  Encounter Date: 01/23/2016      PT End of Session - 01/24/16 2116    Visit Number 6   Number of Visits 12   Date for PT Re-Evaluation 01-30-2016   Authorization Type g codes every 10th visit  starting again at 2/10      PT Start Time 1030   PT Stop Time 1105   PT Time Calculation (min) 35 min   Behavior During Therapy [facility] for tasks assessed/performed      Past Medical History  Diagnosis Date  . Thyroid cancer ([place])   . Reflux   . Hypothyroidism   . Hypertension   . Hyperlipidemia   . Obesity   . Dislocation of hip, congenital   . Fibromyalgia   . Sleep apnea   . Prediabetes   . Diabetes mellitus, type II ([place])   . Tendonitis   . Spinal stenosis     Past Surgical History  Procedure Laterality Date  . Colonoscopy with propofol N/A 02/09/2015    Procedure: COLONOSCOPY WITH PROPOFOL;  Surgeon: [name], MD;  Location: [facility] ENDOSCOPY;  Service: Endoscopy;  Laterality: N/A;  . Esophagogastroduodenoscopy N/A 02/09/2015    Procedure: ESOPHAGOGASTRODUODENOSCOPY (EGD);  Surgeon: [name], MD;  Location: [facility] ENDOSCOPY;  Service: Endoscopy;  Laterality: N/A;  . Thyroid surgery    . Hip surgery      There were no vitals filed for this visit.      Subjective Assessment - 01/24/16 2035    Subjective (p) Pt reported she has benefitted from lymphedema therapy. Her LE scars and her arm swelling has decreased.    Pertinent History (p) Pt reports she currently has a gout episode in her LLE. 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  LLE leg discrepancy on L (wears a shoe lift). R LE has had more surgeries.   In 2016, benign polyps were removed upon +  findings on colonscopy.                      Adult Aquatic Therapy - 01/24/16 2116    Aquatic Therapy Subjective   Subjective Pt reported no complaints         O: Pt entered/exited the pool via ramp with BUE support on rail using sidestepping gait  Exercises performed in 3'6" depth  50 ft =1 lap Cued for blue line on pool floor with feet propioception  2 laps forward walking without UE support, cued for trunk rotation for arm swing 2 laps backwalking  2 laps side stepping L/R    Hip flexor stretches 30 sec  Relaxation noodles under armpits                   PT Long Term Goals - 12/13/15 1815    PT LONG TERM GOAL #1   Title Pt will demo decreased restrictions at lower abdominal scar and R LE in order to decrease pain and tolerate R sidelying for sleeping.    Time 12   Period Weeks   Status Partially Met   PT LONG TERM GOAL #2   Title Pt will report being able to continue shopping despite pelvic mm spasm in order to perform ADLs.  Time 12   Period Weeks   Status Achieved   PT LONG TERM GOAL #3   Title Pt will demo no tenderness to palpation to pelvic floor mm in order to demonstrate increased IND in managing pelvic pain for improved QOL.   Time 12   Period Weeks   Status Achieved   PT LONG TERM GOAL #4   Title Pt will decrease her NIH-CPSI Female score from 41 pts to < 31 pts in order to improve pelvic floor function. (12/13/15: 28 pts)    Time 12   Period Weeks   Status Achieved   PT LONG TERM GOAL #5   Title Pt will be able to achieve functional ROM of trunk to reach around her body for cleaning herself after toileting.    Time 12   Period Weeks   Status On-going   Additional Long Term Goals   Additional Long Term Goals Yes   PT LONG TERM GOAL #6   Title Pt will demo increased R trunk rotation at functional ROM in order for ADLs and self care with bowel movements.    Time 12   Period Weeks   Status On-going   PT LONG TERM GOAL #7    Title Pt will increase her gait speed with SPC from 0.2 m/s to > 0.7 m/s in order ambulate in her house with less difficulty.    Time 12   Period Weeks               Plan - 01/24/16 2117    Clinical Impression Statement Pt tolerated pool therapy without complaints. Pt demo'd increased trunk mobility with arm swing in gait with cuing.  Pt will continue to benefit from skilled PT.    PT Frequency 2x / week   PT Duration 12 weeks   PT Treatment/Interventions ADLs/Self Care Home Management;Aquatic Therapy;DME Instruction;Gait training;Stair training;Functional mobility training;Therapeutic activities;Therapeutic exercise;Balance training;Neuromuscular re-education;Cognitive remediation;Patient/family education;Manual techniques;Energy conservation;Vestibular;Cryotherapy;Electrical Stimulation;Biofeedback;Moist Heat;Taping;Scar mobilization;Passive range of motion   Consulted and Agree with Plan of Care Patient      Patient will benefit from skilled therapeutic intervention in order to improve the following deficits and impairments:  Abnormal gait, Decreased activity tolerance, Decreased balance, Decreased mobility, Decreased safety awareness, Decreased strength, Difficulty walking, Dizziness, Impaired perceived functional ability, Obesity, Pain, Decreased coordination, Decreased range of motion, Increased muscle spasms, Postural dysfunction, Impaired tone, Decreased scar mobility, Improper body mechanics, Impaired sensation, Increased fascial restricitons, Decreased endurance  Visit Diagnosis: Unspecified lack of coordination  Difficulty in walking, not elsewhere classified  Other lack of coordination     Problem List Patient Active Problem List   Diagnosis Date Noted  . Gout 10/26/2015  . [facility] (congenital dislocation of the hip) 06/19/2015  . HLD (hyperlipidemia) 06/19/2015  . BP (high blood pressure) 06/19/2015  . Adiposity 06/19/2015  . Borderline diabetes mellitus 06/19/2015   . Obstructive apnea 06/19/2015  . H/O malignant neoplasm of thyroid 07/15/2014  . Hypothyroidism, postop 07/15/2014  . Degeneration of intervertebral disc of lumbar region 03/18/2013  . Fibromyalgia 03/18/2013  . LBP (low back pain) 03/18/2013    Jerl Mina ,PT, DPT, E-RYT  01/24/2016, 9:20 PM  [place] MAIN [facility] SERVICES [address] [place], [place], [address] Phone: [phone]   Fax:  [phone]  Name: GRESIA ISIDORO MRN: [id number] Date of Birth: 08-18-1961

## 2016-01-30 ENCOUNTER — Ambulatory Visit: Payer: Medicare Other | Admitting: Physical Therapy

## 2016-01-31 ENCOUNTER — Ambulatory Visit: Payer: Medicare Other | Admitting: Occupational Therapy

## 2016-02-01 ENCOUNTER — Ambulatory Visit: Payer: Medicare Other | Admitting: Occupational Therapy

## 2016-02-02 ENCOUNTER — Ambulatory Visit: Payer: Medicare Other | Attending: Gastroenterology | Admitting: Occupational Therapy

## 2016-02-02 DIAGNOSIS — I890 Lymphedema, not elsewhere classified: Secondary | ICD-10-CM

## 2016-02-02 NOTE — Therapy (Signed)
[place] MAIN [facility] SERVICES [address] [place], [place], [address] Phone: [phone]   Fax:  [phone]  Occupational Therapy Treatment  Patient Details  Name: Katie Baker MRN: [id number] Date of Birth: 12-21-1960 Referring Provider: Dr. Rayann Heman  Encounter Date: 02/02/2016      OT End of Session - 02/02/16 1313    Visit Number 10   Number of Visits 16   Date for OT Re-Evaluation 03/14/16   Authorization Type G-code day 10 of 10   Authorization Time Period 5/10 for LE care   Authorization - Visit Number 5   Authorization - Number of Visits 8   OT Start Time 1010   OT Stop Time 1110   OT Time Calculation (min) 60 min   Activity Tolerance Patient tolerated treatment well;No increased pain   Behavior During Therapy [facility] for tasks assessed/performed      Past Medical History  Diagnosis Date  . Thyroid cancer ([place])   . Reflux   . Hypothyroidism   . Hypertension   . Hyperlipidemia   . Obesity   . Dislocation of hip, congenital   . Fibromyalgia   . Sleep apnea   . Prediabetes   . Diabetes mellitus, type II ([place])   . Tendonitis   . Spinal stenosis     Past Surgical History  Procedure Laterality Date  . Colonoscopy with propofol N/A 02/09/2015    Procedure: COLONOSCOPY WITH PROPOFOL;  Surgeon: [name], MD;  Location: [facility] ENDOSCOPY;  Service: Endoscopy;  Laterality: N/A;  . Esophagogastroduodenoscopy N/A 02/09/2015    Procedure: ESOPHAGOGASTRODUODENOSCOPY (EGD);  Surgeon: [name], MD;  Location: [facility] ENDOSCOPY;  Service: Endoscopy;  Laterality: N/A;  . Thyroid surgery    . Hip surgery      There were no vitals filed for this visit.      Subjective Assessment - 02/02/16 1309    Subjective  Pt. presents for OT Lymphedema visit 5 to address B lower and upper quadrant limb swelling and pain. Pt is accompanied by her daughter. Pt reports she missed last visit due to gout flar in her feet.   Patient is  accompained by: Family member   Pertinent History Pt. is a 55 y.o. female who presents with an extensive medical history which hinders her ability to perform  ADL and IADL tasks. This pt.currently resides at home alone, and relies on familiy to assist with ADLs, IADLs, and transportation.    Limitations difficulty stnding and walking, decreased balance, hx frequent falls, difficulty fitting street shoes 2/2 leg swelling, chronic pain, decreased BLE AROM, decreased sensation in feet ( neuropathy), body assymetry,    Patient Stated Goals to decrease leg swelling and pain   Currently in Pain? No/denies   Pain Onset More than a month ago                              OT Education - 02/02/16 1310    Education provided Yes   Education Details Discussed progress towards each LE self care goal and need to demonstrate progress to meet medical necessity reqirement. Provided detailed information, including handouts, regarding Flexitouch system as recommended  self care option for home. Pt agreed to explore trial and OKd information sharing w/ vendor to check benefits   Person(s) Educated Patient;Child(ren)   Methods Explanation   Comprehension Verbalized understanding;Need further instruction  OT Long Term Goals - 02/02/16 1536    OT LONG TERM GOAL #1   Title Pt. will demonstrate independent A/E use for LE dressing.    Baseline unable   Time 8   Period Weeks   Status On-going   OT LONG TERM GOAL #2   Title Pt. will be independent with a toilet use for more thorough toilet hygiene care.   Baseline unable   Time 8   Period Weeks   Status On-going   OT LONG TERM GOAL #3   Title Pt. will  be demonstrate energy conservation/work simplification techniques for IADL tasks 100% of the time.     Baseline unable   Time 8   Period Weeks   Status On-going   OT LONG TERM GOAL #4   Title Pt. will demonstrate safe shower transfers using DME with supervision   Baseline  unable   Time 8   Period Weeks   Status On-going   OT LONG TERM GOAL #5   Title Pt. will increase right grip strength by 3# to assist with opening jar, and holding utensils.   Baseline drops items from hand occassionally   Time 8   Period Weeks   Status On-going   OT LONG TERM GOAL #6   Title Pt. will improve right hand coordination by 3 sec. to be able grasp and hold grooming items.    Baseline 1 min & 2sec. on the 9-hole peg test   Time 8   Period Weeks   Status On-going   OT LONG TERM GOAL #7   Title Lymphedema (LE) self-care: Pt able to apply thigh-length, multi layered, gradient compression wraps with maximum caregiver assistance within 2 weeks to achieve optimal limb volume reduction.  his goal was unrealistic as  Pt required MAX caregiver assistance to access compression wraps for volume reduction, and caregiver's own health problems limitted her ability to apply compression. GOAL DISCHARGED   Baseline dependent   Time 2   Period Weeks   Status Not Met   OT LONG TERM GOAL #8   Title Lymphedema care:  Pt to achieve at least 10% BLE limb volume reductions during Intensive CDT to limit LE progression, decrease infection risk and fall risk, to reduce pain/ discomfort, and to improve safe ambulation and functional mobility. This goal was unrealistic as  Pt required MAX caregiver assistance to access compression wraps for volume reduction, and caregiver's own health problems limitted her ability to apply compression. GOAL DISCHARGED   Baseline dependent   Time 8   Period Weeks   Status Not Met   OT LONG TERM GOAL  #9   Baseline Lymphedema care: Pt >/= 85 % compliant with all daily, LE self-care protocols for home program, including simple self-manual lymphatic drainage (MLD), skin care, lymphatic pumping the ex, skin care, and donning/ doffing compression wraps and garments with needed level of caregiver assistance to limit LE progression and further functional decline. (Baseline:  dependent)     Time 8   Period Weeks   Status Partially Met   OT LONG TERM GOAL  #10   TITLE Lymphedema care: Pt to tolerate light, off the shelf, knee length compression stockings for daytime use  in keeping w/ prescribed wear regime within 1 week of issue date to retain clinical and functional gains and to limit progression.   Baseline dependent   Time 8   Period Weeks   Status Revised  Plan - 2016-02-04 1314    Clinical Impression Statement Pt receiving analgesis effict from MLD, most likely due to improving lymphatic circulation to flush inflamatiory elements 2/2 fibromyalgia, for stimulation to immune structures, and to decrease swelling. Reviewed goals and DC volume reduction and compression wrap goals, as these are unrealistic  Pt agrees with change in priorities of self care to caregiver edu for assisting w/ donning and doffing OTS compression knee highs using AD PRN, and also exploring access to sequential pneumatic compression device (Flexitouch System) needed to perform  manual lymphatic drainage at home for ongoing self care as independently as possible.   Rehab Potential Good   Clinical Impairments Affecting Rehab Potential Postive indicators: age; Negative Indicators: multiple comorbidities   OT Frequency 1x / week   OT Duration 8 weeks   OT Treatment/Interventions Therapeutic exercises;Manual Therapy;DME and/or AE instruction;Patient/family education;Therapeutic activities;Manual lymph drainage;Compression bandaging;Other (comment);Self-care/ADL training  skin care   OT Home Exercise Plan 2 x q day BLE lymphatic pumping ther ex seated or supine, 10 reps each w/ 15-30 second hold   Consulted and Agree with Plan of Care Patient      Patient will benefit from skilled therapeutic intervention in order to improve the following deficits and impairments:  Decreased range of motion, Decreased knowledge of use of DME, Decreased skin integrity, Increased edema, Pain,  Impaired flexibility, Decreased mobility, Impaired sensation, Decreased endurance, Decreased balance, Decreased knowledge of precautions, Difficulty walking, Impaired perceived functional ability  Visit Diagnosis: Lymphedema, not elsewhere classified      G-Codes - 02-04-2016 1549    Functional Assessment Tool Used clinical observation, comparative volumetrics, interview, medical record review   Functional Limitation Self care   Self Care Current Status (W0981) At least 80 percent but less than 100 percent impaired, limited or restricted   Self Care Goal Status (X9147) At least 60 percent but less than 80 percent impaired, limited or restricted      Problem List Patient Active Problem List   Diagnosis Date Noted  . Gout 10/26/2015  . [facility] (congenital dislocation of the hip) 06/19/2015  . HLD (hyperlipidemia) 06/19/2015  . BP (high blood pressure) 06/19/2015  . Adiposity 06/19/2015  . Borderline diabetes mellitus 06/19/2015  . Obstructive apnea 06/19/2015  . H/O malignant neoplasm of thyroid 07/15/2014  . Hypothyroidism, postop 07/15/2014  . Degeneration of intervertebral disc of lumbar region 03/18/2013  . Fibromyalgia 03/18/2013  . LBP (low back pain) 03/18/2013   Andrey Spearman, MS, OTR/L, [facility] 2016/02/04 3:52 PM   [place] MAIN [facility] SERVICES [address] [place], [place], [address] Phone: [phone]   Fax:  [phone]  Name: DYMON SUMMERHILL MRN: [id number] Date of Birth: [birth year]

## 2016-02-02 NOTE — Patient Instructions (Signed)
LE instructions and precautions as established- see initial eval.   

## 2016-02-05 ENCOUNTER — Ambulatory Visit: Payer: Medicare Other | Admitting: Occupational Therapy

## 2016-02-06 ENCOUNTER — Ambulatory Visit: Payer: Medicare Other | Admitting: Physical Therapy

## 2016-02-07 ENCOUNTER — Ambulatory Visit: Payer: Medicare Other | Admitting: Occupational Therapy

## 2016-02-08 ENCOUNTER — Ambulatory Visit: Payer: Medicare Other | Admitting: Physical Therapy

## 2016-02-12 ENCOUNTER — Ambulatory Visit: Payer: Medicare Other | Admitting: Occupational Therapy

## 2016-02-12 DIAGNOSIS — I890 Lymphedema, not elsewhere classified: Secondary | ICD-10-CM

## 2016-02-12 NOTE — Therapy (Signed)
[place] MAIN [facility] SERVICES [address] [place], [place], [address] Phone: [phone]   Fax:  [phone]  Occupational Therapy Treatment  Patient Details  Name: Katie Baker MRN: [id number] Date of Birth: [birth year] Referring Provider: Dr. Rayann Heman  Encounter Date: 02/12/2016      OT End of Session - 02/12/16 0955    Visit Number 11   Number of Visits 16   Date for OT Re-Evaluation 03/14/16   Authorization Type 1 of 10 G code   Authorization Time Period 6/10 for LE care   Authorization - Visit Number 5   Authorization - Number of Visits 8   OT Start Time 0950   OT Stop Time 1120   OT Time Calculation (min) 90 min   Activity Tolerance Patient tolerated treatment well;No increased pain   Behavior During Therapy [facility] for tasks assessed/performed      Past Medical History  Diagnosis Date  . Thyroid cancer ([place])   . Reflux   . Hypothyroidism   . Hypertension   . Hyperlipidemia   . Obesity   . Dislocation of hip, congenital   . Fibromyalgia   . Sleep apnea   . Prediabetes   . Diabetes mellitus, type II ([place])   . Tendonitis   . Spinal stenosis     Past Surgical History  Procedure Laterality Date  . Colonoscopy with propofol N/A 02/09/2015    Procedure: COLONOSCOPY WITH PROPOFOL;  Surgeon: [name], MD;  Location: [facility] ENDOSCOPY;  Service: Endoscopy;  Laterality: N/A;  . Esophagogastroduodenoscopy N/A 02/09/2015    Procedure: ESOPHAGOGASTRODUODENOSCOPY (EGD);  Surgeon: [name], MD;  Location: [facility] ENDOSCOPY;  Service: Endoscopy;  Laterality: N/A;  . Thyroid surgery    . Hip surgery      There were no vitals filed for this visit.      Subjective Assessment - 02/12/16 1202    Subjective  Pt. presents for OT Lymphedema visit 6 to address B lower and upper quadrant limb swelling and pain. Pt reports she had consult with [facility] orthopedic surgeon to explore C2-6 posterior cervical laminectomy and fusion. Pt  reports she is opting for conservative approach at present, including  spine PT and steroid injections. She states she will consider surgery carefully but is conmcerned about  possibility of paralysis. Also discussed compression pump again and Pt watched educational YouTube video during MLD   Patient is accompained by: Family member   Pertinent History Pt. is a 55 y.o. female who presents with an extensive medical history which hinders her ability to perform  ADL and IADL tasks. This pt.currently resides at home alone, and relies on familiy to assist with ADLs, IADLs, and transportation.    Limitations difficulty stnding and walking, decreased balance, hx frequent falls, difficulty fitting street shoes 2/2 leg swelling, chronic pain, decreased BLE AROM, decreased sensation in feet ( neuropathy), body assymetry,    Patient Stated Goals to decrease leg swelling and pain   Pain Score --  not rated   Pain Onset More than a month ago                      OT Treatments/Exercises (OP) - 02/12/16 0001    ADLs   ADL Education Given Yes   Manual Therapy   Manual Therapy Edema management;Manual Lymphatic Drainage (MLD)   Edema Management scar massage   Manual Lymphatic Drainage (MLD) Manual lymph drainage to BLE and BUE in supine w/ head  slightly elevated and knees flexed on pillow to limit back pain. Utilized modified short neck avoiding thyroid, and all intact, functional lymphatic pathways at B anterior axillary and inguinal anastomoses. Pt completed diaphragmatic breathing.                      OT Long Term Goals - 02/02/16 1536    OT LONG TERM GOAL #1   Title Pt. will demonstrate independent A/E use for LE dressing.    Baseline unable   Time 8   Period Weeks   Status On-going   OT LONG TERM GOAL #2   Title Pt. will be independent with a toilet use for more thorough toilet hygiene care.   Baseline unable   Time 8   Period Weeks   Status On-going   OT LONG TERM  GOAL #3   Title Pt. will  be demonstrate energy conservation/work simplification techniques for IADL tasks 100% of the time.     Baseline unable   Time 8   Period Weeks   Status On-going   OT LONG TERM GOAL #4   Title Pt. will demonstrate safe shower transfers using DME with supervision   Baseline unable   Time 8   Period Weeks   Status On-going   OT LONG TERM GOAL #5   Title Pt. will increase right grip strength by 3# to assist with opening jar, and holding utensils.   Baseline drops items from hand occassionally   Time 8   Period Weeks   Status On-going   OT LONG TERM GOAL #6   Title Pt. will improve right hand coordination by 3 sec. to be able grasp and hold grooming items.    Baseline 1 min & 2sec. on the 9-hole peg test   Time 8   Period Weeks   Status On-going   OT LONG TERM GOAL #7   Title Lymphedema (LE) self-care: Pt able to apply thigh-length, multi layered, gradient compression wraps with maximum caregiver assistance within 2 weeks to achieve optimal limb volume reduction.  his goal was unrealistic as  Pt required MAX caregiver assistance to access compression wraps for volume reduction, and caregiver's own health problems limitted her ability to apply compression. GOAL DISCHARGED   Baseline dependent   Time 2   Period Weeks   Status Not Met   OT LONG TERM GOAL #8   Title Lymphedema care:  Pt to achieve at least 10% BLE limb volume reductions during Intensive CDT to limit LE progression, decrease infection risk and fall risk, to reduce pain/ discomfort, and to improve safe ambulation and functional mobility. This goal was unrealistic as  Pt required MAX caregiver assistance to access compression wraps for volume reduction, and caregiver's own health problems limitted her ability to apply compression. GOAL DISCHARGED   Baseline dependent   Time 8   Period Weeks   Status Not Met   OT LONG TERM GOAL  #9   Baseline Lymphedema care: Pt >/= 85 % compliant with all daily, LE  self-care protocols for home program, including simple self-manual lymphatic drainage (MLD), skin care, lymphatic pumping the ex, skin care, and donning/ doffing compression wraps and garments with needed level of caregiver assistance to limit LE progression and further functional decline. (Baseline: dependent)     Time 8   Period Weeks   Status Partially Met   OT LONG TERM GOAL  #10   TITLE Lymphedema care: Pt to tolerate light, off the shelf, knee  length compression stockings for daytime use  in keeping w/ prescribed wear regime within 1 week of issue date to retain clinical and functional gains and to limit progression.   Baseline dependent   Time 8   Period Weeks   Status Revised               Plan - 02/12/16 1214    Clinical Impression Statement Pt tolerated MLD very well today. No pain with palpation. Leg swelling largely unchanged due to difficulty accessing compression . Pt unable to don/ doff traditional elastic stockings and caregiver has heavy family responsibilities. Pt is excellent candidate for Flexitouch system for BLE edema management with greatest level of independence over time as her abilities continue to decline 2/2 progressing , severe spinal stenosis. Vendor  agrees to call Pt to clarify insurance info, to check benefits, If covered Pt would likel set up a trial w/ Flexitouch sequential pneumatic compression device .   Rehab Potential Good   Clinical Impairments Affecting Rehab Potential Postive indicators: age; Negative Indicators: multiple comorbidities   OT Frequency 1x / week   OT Duration 8 weeks   OT Treatment/Interventions Therapeutic exercises;Manual Therapy;DME and/or AE instruction;Patient/family education;Therapeutic activities;Manual lymph drainage;Compression bandaging;Other (comment);Self-care/ADL training  skin care   OT Home Exercise Plan 2 x q day BLE lymphatic pumping ther ex seated or supine, 10 reps each w/ 15-30 second hold   Consulted and Agree  with Plan of Care Patient      Patient will benefit from skilled therapeutic intervention in order to improve the following deficits and impairments:  Decreased range of motion, Decreased knowledge of use of DME, Decreased skin integrity, Increased edema, Pain, Impaired flexibility, Decreased mobility, Impaired sensation, Decreased endurance, Decreased balance, Decreased knowledge of precautions, Difficulty walking, Impaired perceived functional ability  Visit Diagnosis: Lymphedema, not elsewhere classified    Problem List Patient Active Problem List   Diagnosis Date Noted  . Gout 10/26/2015  . [facility] (congenital dislocation of the hip) 06/19/2015  . HLD (hyperlipidemia) 06/19/2015  . BP (high blood pressure) 06/19/2015  . Adiposity 06/19/2015  . Borderline diabetes mellitus 06/19/2015  . Obstructive apnea 06/19/2015  . H/O malignant neoplasm of thyroid 07/15/2014  . Hypothyroidism, postop 07/15/2014  . Degeneration of intervertebral disc of lumbar region 03/18/2013  . Fibromyalgia 03/18/2013  . LBP (low back pain) 03/18/2013    Andrey Spearman, MS, OTR/L, [facility] 02/12/2016 12:22 PM  [place] MAIN [facility] SERVICES [address] [place], [place], [address] Phone: [phone]   Fax:  [phone]  Name: Katie Baker MRN: [id number] Date of Birth: September 16, 1960

## 2016-02-12 NOTE — Patient Instructions (Signed)
LE instructions and precautions as established- see initial eval.   

## 2016-02-14 ENCOUNTER — Ambulatory Visit: Payer: Medicare Other | Admitting: Occupational Therapy

## 2016-02-15 ENCOUNTER — Ambulatory Visit: Payer: Medicare Other | Admitting: Physical Therapy

## 2016-02-19 ENCOUNTER — Ambulatory Visit: Payer: Medicare Other | Admitting: Occupational Therapy

## 2016-02-20 ENCOUNTER — Ambulatory Visit: Payer: Medicare Other | Admitting: Physical Therapy

## 2016-02-21 ENCOUNTER — Telehealth: Payer: Self-pay | Admitting: Physical Therapy

## 2016-02-21 ENCOUNTER — Ambulatory Visit: Payer: Medicare Other | Admitting: Occupational Therapy

## 2016-02-21 NOTE — Telephone Encounter (Signed)
PT called pt for the second time since she has no showed to two appts. PT left message expressing concern and wanted to hear from pt to make sure everything is okay.  PT also stated her future appointments are being removed from the schedule due to no show policy.

## 2016-02-22 ENCOUNTER — Ambulatory Visit: Payer: Medicare Other | Admitting: Podiatry

## 2016-02-26 ENCOUNTER — Ambulatory Visit: Payer: Medicare Other | Admitting: Occupational Therapy

## 2016-02-28 ENCOUNTER — Ambulatory Visit: Payer: Medicare Other | Admitting: Occupational Therapy

## 2016-02-29 ENCOUNTER — Ambulatory Visit: Payer: Medicare Other | Admitting: Physical Therapy

## 2016-03-04 ENCOUNTER — Ambulatory Visit: Payer: Medicare Other | Attending: Gastroenterology | Admitting: Occupational Therapy

## 2016-03-04 DIAGNOSIS — I890 Lymphedema, not elsewhere classified: Secondary | ICD-10-CM | POA: Diagnosis present

## 2016-03-04 NOTE — Patient Instructions (Signed)
LE instructions and precautions as established- see initial eval.   

## 2016-03-04 NOTE — Therapy (Signed)
[place] MAIN [facility] SERVICES [address] [place], [place], [address] Phone: [phone]   Fax:  [phone]  Occupational Therapy Treatment  Patient Details  Name: Katie Baker MRN: [id number] Date of Birth: 1960/10/29 Referring Provider: Dr. Rayann Heman  Encounter Date: 03/04/2016      OT End of Session - 03/04/16 1015    Visit Number 13   Number of Visits 16   Date for OT Re-Evaluation 03/14/16   Authorization Type 1 of 10 G code   Authorization Time Period 6/10 for LE care   Authorization - Visit Number 5   Authorization - Number of Visits 8   OT Start Time 1006   OT Stop Time 1100   OT Time Calculation (min) 54 min   Activity Tolerance Patient tolerated treatment well;No increased pain   Behavior During Therapy [facility] for tasks assessed/performed      Past Medical History  Diagnosis Date  . Thyroid cancer ([place])   . Reflux   . Hypothyroidism   . Hypertension   . Hyperlipidemia   . Obesity   . Dislocation of hip, congenital   . Fibromyalgia   . Sleep apnea   . Prediabetes   . Diabetes mellitus, type II ([place])   . Tendonitis   . Spinal stenosis     Past Surgical History  Procedure Laterality Date  . Colonoscopy with propofol N/A 02/09/2015    Procedure: COLONOSCOPY WITH PROPOFOL;  Surgeon: [name], MD;  Location: [facility] ENDOSCOPY;  Service: Endoscopy;  Laterality: N/A;  . Esophagogastroduodenoscopy N/A 02/09/2015    Procedure: ESOPHAGOGASTRODUODENOSCOPY (EGD);  Surgeon: [name], MD;  Location: [facility] ENDOSCOPY;  Service: Endoscopy;  Laterality: N/A;  . Thyroid surgery    . Hip surgery      There were no vitals filed for this visit.      Subjective Assessment - 03/04/16 1644    Subjective  Pt. presents for OT Lymphedema visit 13 to address B lower and upper quadrant limb swelling and pain. Pt reports she has opted for neck injection instead of surgery for spinal stenosis at this time.   Pertinent History  Pt. is a 55 y.o. female who presents with an extensive medical history which hinders her ability to perform  ADL and IADL tasks. This pt.currently resides at home alone, and relies on familiy to assist with ADLs, IADLs, and transportation.    Limitations difficulty stnding and walking, decreased balance, hx frequent falls, difficulty fitting street shoes 2/2 leg swelling, chronic pain, decreased BLE AROM, decreased sensation in feet ( neuropathy), body assymetry,    Patient Stated Goals to decrease leg swelling and pain   Currently in Pain? Yes  not rated numerically. level unchanged since last vist                      OT Treatments/Exercises (OP) - 03/04/16 0001    ADLs   ADL Education Given Yes   Manual Therapy   Manual Therapy Edema management;Manual Lymphatic Drainage (MLD)   Manual Lymphatic Drainage (MLD) Manual lymph drainage to BLE and BUE in supine w/ head slightly elevated and knees flexed on pillow to limit back pain. Utilized modified short neck avoiding thyroid, and all intact, functional lymphatic pathways at B anterior axillary and inguinal anastomoses. Pt completed diaphragmatic breathing.                 OT Education - 03/04/16 1642    Education provided Yes  Education Details Continued skilled Pt/caregiver Education  And LE ADL training throughout visit for lymphedema self care, including compression wrapping, compression garment and device wear/care, lymphatic pumping ther ex, simple self-MLD, and skin care. Discussed progress towards goals.   Person(s) Educated Patient   Methods Explanation   Comprehension Verbalized understanding;Need further instruction             OT Long Term Goals - 02/02/16 1536    OT LONG TERM GOAL #1   Title Pt. will demonstrate independent A/E use for LE dressing.    Baseline unable   Time 8   Period Weeks   Status On-going   OT LONG TERM GOAL #2   Title Pt. will be independent with a toilet use for more  thorough toilet hygiene care.   Baseline unable   Time 8   Period Weeks   Status On-going   OT LONG TERM GOAL #3   Title Pt. will  be demonstrate energy conservation/work simplification techniques for IADL tasks 100% of the time.     Baseline unable   Time 8   Period Weeks   Status On-going   OT LONG TERM GOAL #4   Title Pt. will demonstrate safe shower transfers using DME with supervision   Baseline unable   Time 8   Period Weeks   Status On-going   OT LONG TERM GOAL #5   Title Pt. will increase right grip strength by 3# to assist with opening jar, and holding utensils.   Baseline drops items from hand occassionally   Time 8   Period Weeks   Status On-going   OT LONG TERM GOAL #6   Title Pt. will improve right hand coordination by 3 sec. to be able grasp and hold grooming items.    Baseline 1 min & 2sec. on the 9-hole peg test   Time 8   Period Weeks   Status On-going   OT LONG TERM GOAL #7   Title Lymphedema (LE) self-care: Pt able to apply thigh-length, multi layered, gradient compression wraps with maximum caregiver assistance within 2 weeks to achieve optimal limb volume reduction.  his goal was unrealistic as  Pt required MAX caregiver assistance to access compression wraps for volume reduction, and caregiver's own health problems limitted her ability to apply compression. GOAL DISCHARGED   Baseline dependent   Time 2   Period Weeks   Status Not Met   OT LONG TERM GOAL #8   Title Lymphedema care:  Pt to achieve at least 10% BLE limb volume reductions during Intensive CDT to limit LE progression, decrease infection risk and fall risk, to reduce pain/ discomfort, and to improve safe ambulation and functional mobility. This goal was unrealistic as  Pt required MAX caregiver assistance to access compression wraps for volume reduction, and caregiver's own health problems limitted her ability to apply compression. GOAL DISCHARGED   Baseline dependent   Time 8   Period Weeks    Status Not Met   OT LONG TERM GOAL  #9   Baseline Lymphedema care: Pt >/= 85 % compliant with all daily, LE self-care protocols for home program, including simple self-manual lymphatic drainage (MLD), skin care, lymphatic pumping the ex, skin care, and donning/ doffing compression wraps and garments with needed level of caregiver assistance to limit LE progression and further functional decline. (Baseline: dependent)     Time 8   Period Weeks   Status Partially Met   OT LONG TERM GOAL  #10  TITLE Lymphedema care: Pt to tolerate light, off the shelf, knee length compression stockings for daytime use  in keeping w/ prescribed wear regime within 1 week of issue date to retain clinical and functional gains and to limit progression.   Baseline dependent   Time 8   Period Weeks   Status Revised               Plan - 03/04/16 1646    Clinical Impression Statement Pt education continued for pros and cons of comperssion pumps . Pt agrees w/ plan to participate in clinical trial w/ vendor at next session. She understands that has spinal snenosis progresses she may be less able to perform self massage, which is limited at present that it's doubtful that it is effectivre.   Rehab Potential Good   Clinical Impairments Affecting Rehab Potential Postive indicators: age; Negative Indicators: multiple comorbidities      Patient will benefit from skilled therapeutic intervention in order to improve the following deficits and impairments:  Decreased range of motion, Decreased knowledge of use of DME, Decreased skin integrity, Increased edema, Pain, Impaired flexibility, Decreased mobility, Impaired sensation, Decreased endurance, Decreased balance, Decreased knowledge of precautions, Difficulty walking, Impaired perceived functional ability  Visit Diagnosis: Lymphedema, not elsewhere classified    Problem List Patient Active Problem List   Diagnosis Date Noted  . Gout 10/26/2015  . [facility]  (congenital dislocation of the hip) 06/19/2015  . HLD (hyperlipidemia) 06/19/2015  . BP (high blood pressure) 06/19/2015  . Adiposity 06/19/2015  . Borderline diabetes mellitus 06/19/2015  . Obstructive apnea 06/19/2015  . H/O malignant neoplasm of thyroid 07/15/2014  . Hypothyroidism, postop 07/15/2014  . Degeneration of intervertebral disc of lumbar region 03/18/2013  . Fibromyalgia 03/18/2013  . LBP (low back pain) 03/18/2013    Andrey Spearman, MS, OTR/L, [facility] 03/04/2016 4:50 PM  [place] MAIN [facility] SERVICES [address] [place], [place], [address] Phone: [phone]   Fax:  [phone]  Name: Katie Baker MRN: [id number] Date of Birth: 02-10-1961

## 2016-03-06 ENCOUNTER — Ambulatory Visit: Payer: Medicare Other | Admitting: Occupational Therapy

## 2016-03-11 ENCOUNTER — Ambulatory Visit: Payer: Medicare Other | Admitting: Occupational Therapy

## 2016-03-11 DIAGNOSIS — I890 Lymphedema, not elsewhere classified: Secondary | ICD-10-CM | POA: Diagnosis not present

## 2016-03-11 NOTE — Therapy (Signed)
[place] MAIN [facility] SERVICES [address] [place], [place], [address] Phone: [phone]   Fax:  [phone]  Occupational Therapy Treatment  Patient Details  Name: Katie Baker MRN: [id number] Date of Birth: 27-Feb-1961 Referring Provider: Dr. Rayann Heman  Encounter Date: 03/11/2016      OT End of Session - 03/11/16 1136    Visit Number 14   Number of Visits 16   Date for OT Re-Evaluation 03/14/16   OT Start Time 1010   OT Stop Time 1115   OT Time Calculation (min) 65 min   Equipment Utilized During Treatment Flexitouch sequential pneumatic compression device   Activity Tolerance Patient tolerated treatment well;No increased pain   Behavior During Therapy [facility] for tasks assessed/performed      Past Medical History  Diagnosis Date  . Thyroid cancer ([place])   . Reflux   . Hypothyroidism   . Hypertension   . Hyperlipidemia   . Obesity   . Dislocation of hip, congenital   . Fibromyalgia   . Sleep apnea   . Prediabetes   . Diabetes mellitus, type II ([place])   . Tendonitis   . Spinal stenosis     Past Surgical History  Procedure Laterality Date  . Colonoscopy with propofol N/A 02/09/2015    Procedure: COLONOSCOPY WITH PROPOFOL;  Surgeon: [name], MD;  Location: [facility] ENDOSCOPY;  Service: Endoscopy;  Laterality: N/A;  . Esophagogastroduodenoscopy N/A 02/09/2015    Procedure: ESOPHAGOGASTRODUODENOSCOPY (EGD);  Surgeon: [name], MD;  Location: [facility] ENDOSCOPY;  Service: Endoscopy;  Laterality: N/A;  . Thyroid surgery    . Hip surgery      There were no vitals filed for this visit.      Subjective Assessment - 03/11/16 1034    Subjective  Pt. presents for OT Lymphedema visit 14 to address B lower and upper quadrant limb swelling and pain. Manufacturer's rep for [employer], [name], is here today to assist w/ patient and caregiver education and clinical trial of sequential pneumatic lymphedema pump,  Flexitouch system. This assistive device is medically necessary for optimal lymphedema self-care and management over time to limit progression and further functional decline.   Patient is accompained by: Family member   Pertinent History Pt. is a 55 y.o. female who presents with an extensive medical history which hinders her ability to perform  ADL and IADL tasks. This pt.currently resides at home alone, and relies on familiy to assist with ADLs, IADLs, and transportation.    Limitations difficulty stnding and walking, decreased balance, hx frequent falls, difficulty fitting street shoes 2/2 leg swelling, chronic pain, decreased BLE AROM, decreased sensation in feet ( neuropathy), body assymetry,    Patient Stated Goals to decrease leg swelling and pain   Currently in Pain? Yes  nt rated numerically.   Pain Onset More than a month ago                      OT Treatments/Exercises (OP) - 03/11/16 0001    ADLs   ADL Education Given Yes   Manual Therapy   Manual Therapy Edema management   Manual therapy comments Compression pump trial   Edema Management RLE anatomical measurements at 5 landmarks before Flexitouch Trial as follows: ankle:  22.5 cm  calf:  37.5 cm  knee: 40.0 cm  Thigh  43.5 cm  waist:  98 cm  OT Long Term Goals - 02/02/16 1536    OT LONG TERM GOAL #1   Title Pt. will demonstrate independent A/E use for LE dressing.    Baseline unable   Time 8   Period Weeks   Status On-going   OT LONG TERM GOAL #2   Title Pt. will be independent with a toilet use for more thorough toilet hygiene care.   Baseline unable   Time 8   Period Weeks   Status On-going   OT LONG TERM GOAL #3   Title Pt. will  be demonstrate energy conservation/work simplification techniques for IADL tasks 100% of the time.     Baseline unable   Time 8   Period Weeks   Status On-going   OT LONG TERM GOAL #4   Title Pt. will demonstrate safe shower transfers  using DME with supervision   Baseline unable   Time 8   Period Weeks   Status On-going   OT LONG TERM GOAL #5   Title Pt. will increase right grip strength by 3# to assist with opening jar, and holding utensils.   Baseline drops items from hand occassionally   Time 8   Period Weeks   Status On-going   OT LONG TERM GOAL #6   Title Pt. will improve right hand coordination by 3 sec. to be able grasp and hold grooming items.    Baseline 1 min & 2sec. on the 9-hole peg test   Time 8   Period Weeks   Status On-going   OT LONG TERM GOAL #7   Title Lymphedema (LE) self-care: Pt able to apply thigh-length, multi layered, gradient compression wraps with maximum caregiver assistance within 2 weeks to achieve optimal limb volume reduction.  his goal was unrealistic as  Pt required MAX caregiver assistance to access compression wraps for volume reduction, and caregiver's own health problems limitted her ability to apply compression. GOAL DISCHARGED   Baseline dependent   Time 2   Period Weeks   Status Not Met   OT LONG TERM GOAL #8   Title Lymphedema care:  Pt to achieve at least 10% BLE limb volume reductions during Intensive CDT to limit LE progression, decrease infection risk and fall risk, to reduce pain/ discomfort, and to improve safe ambulation and functional mobility. This goal was unrealistic as  Pt required MAX caregiver assistance to access compression wraps for volume reduction, and caregiver's own health problems limitted her ability to apply compression. GOAL DISCHARGED   Baseline dependent   Time 8   Period Weeks   Status Not Met   OT LONG TERM GOAL  #9   Baseline Lymphedema care: Pt >/= 85 % compliant with all daily, LE self-care protocols for home program, including simple self-manual lymphatic drainage (MLD), skin care, lymphatic pumping the ex, skin care, and donning/ doffing compression wraps and garments with needed level of caregiver assistance to limit LE progression and  further functional decline. (Baseline: dependent)     Time 8   Period Weeks   Status Partially Met   OT LONG TERM GOAL  #10   TITLE Lymphedema care: Pt to tolerate light, off the shelf, knee length compression stockings for daytime use  in keeping w/ prescribed wear regime within 1 week of issue date to retain clinical and functional gains and to limit progression.   Baseline dependent   Time 8   Period Weeks   Status Revised  Plan - 03/11/16 1140    Clinical Impression Statement Pt tolerated full 1 hr. Fexitouch trial to RLE without increased pain/ discomfort or shortness of breath. Pt reported positive analgesic effects, including decreased sensations of swelling, limb heaviness and tightness. Positive indentations below knee observed at end of session when garment removed indicates fluid and tissue protein were malleable by device. Despite Pt remaining 100% compliant with modified lymphedema self-care home program to limit progression, progressive cervical spinal stenosis with associated functional decline limits her ability to effectively perform modified lymphedema self-care home program components to limit progression. Due to progressing cervical spinal stenosis pt is unable to effectively perform simple self MLD, to inspect and perform skin care regime, to perform lymphatic pumping ther ex, and to don and doff traditional elastic or alternative adjustable compression garments.    Rehab Potential Good   Clinical Impairments Affecting Rehab Potential Postive indicators: age; Negative Indicators: multiple comorbidities      Patient will benefit from skilled therapeutic intervention in order to improve the following deficits and impairments:  Decreased range of motion, Decreased knowledge of use of DME, Decreased skin integrity, Increased edema, Pain, Impaired flexibility, Decreased mobility, Impaired sensation, Decreased endurance, Decreased balance, Decreased knowledge of  precautions, Difficulty walking, Impaired perceived functional ability  Visit Diagnosis: Lymphedema, not elsewhere classified    Problem List Patient Active Problem List   Diagnosis Date Noted  . Gout 10/26/2015  . [facility] (congenital dislocation of the hip) 06/19/2015  . HLD (hyperlipidemia) 06/19/2015  . BP (high blood pressure) 06/19/2015  . Adiposity 06/19/2015  . Borderline diabetes mellitus 06/19/2015  . Obstructive apnea 06/19/2015  . H/O malignant neoplasm of thyroid 07/15/2014  . Hypothyroidism, postop 07/15/2014  . Degeneration of intervertebral disc of lumbar region 03/18/2013  . Fibromyalgia 03/18/2013  . LBP (low back pain) 03/18/2013    Andrey Spearman, MS, OTR/L, [facility] 03/11/2016 11:42 AM  [place] MAIN [facility] SERVICES [address] [place], [place], [address] Phone: [phone]   Fax:  [phone]  Name: BERNARDETTE WALDRON MRN: [id number] Date of Birth: [birth year]

## 2016-03-18 ENCOUNTER — Ambulatory Visit: Payer: Medicare Other | Admitting: Occupational Therapy

## 2016-03-25 ENCOUNTER — Ambulatory Visit: Payer: Medicare Other | Admitting: Occupational Therapy

## 2016-03-25 DIAGNOSIS — I890 Lymphedema, not elsewhere classified: Secondary | ICD-10-CM | POA: Diagnosis not present

## 2016-03-25 NOTE — Therapy (Signed)
[place] MAIN [facility] SERVICES [address] [place], [place], [address] Phone: [phone]   Fax:  [phone]  Occupational Therapy Treatment  Katie Baker Details  Name: Katie Baker MRN: [id number] Date of Birth: 02-Mar-1961 Referring Provider: Dr. Rayann Heman  Encounter Date: 03/25/2016      OT End of Session - 03/25/16 1449    Visit Number 15   Number of Visits 16   Date for OT Re-Evaluation 03/14/16   OT Start Time 1035   OT Stop Time 1115   OT Time Calculation (min) 40 min   Activity Tolerance Katie Baker tolerated treatment well;No increased pain   Behavior During Therapy WFL for tasks assessed/performed      Past Medical History:  Diagnosis Date  . Diabetes mellitus, type II ([place])   . Dislocation of hip, congenital   . Fibromyalgia   . Hyperlipidemia   . Hypertension   . Hypothyroidism   . Obesity   . Prediabetes   . Reflux   . Sleep apnea   . Spinal stenosis   . Tendonitis   . Thyroid cancer [facility])     Past Surgical History:  Procedure Laterality Date  . COLONOSCOPY WITH PROPOFOL N/A 02/09/2015   Procedure: COLONOSCOPY WITH PROPOFOL;  Surgeon: [name], MD;  Location[facility] ENDOSCOPY;  Service: Endoscopy;  Laterality: N/A;  . ESOPHAGOGASTRODUODENOSCOPY N/A 02/09/2015   Procedure: ESOPHAGOGASTRODUODENOSCOPY (EGD);  Surgeon: [name], MD;  Location: [facility] ENDOSCOPY;  Service: Endoscopy;  Laterality: N/A;  . HIP SURGERY    . THYROID SURGERY      There were no vitals filed for this visit.      Subjective Assessment - 03/25/16 1446    Subjective  Pt. presents for OT Lymphedema visit 15 to address B lower and upper quadrant limb swelling and pain. Pt has no new complaints. She tells me she had neck injection recentlly and is hopeful it will give her pain releif. Pt states she heard from pump vendor but is unsure what the next step is for procuring device.   Katie Baker is accompained by: Family member   Pertinent  History Pt. is a 55 y.o. female who presents with an extensive medical history which hinders her ability to perform  ADL and IADL tasks. This pt.currently resides at home alone, and relies on familiy to assist with ADLs, IADLs, and transportation.    Limitations difficulty stnding and walking, decreased balance, hx frequent falls, difficulty fitting street shoes 2/2 leg swelling, chronic pain, decreased BLE AROM, decreased sensation in feet ( neuropathy), body assymetry,    Katie Baker Stated Goals to decrease leg swelling and pain   Pain Onset More than a month ago                      OT Treatments/Exercises (OP) - 03/25/16 0001      Manual Therapy   Manual Therapy Edema management   Manual Lymphatic Drainage (MLD) Manual lymph drainage to BLE and BUE in supine w/ head slightly elevated and knees flexed on pillow to limit back pain. Utilized modified short neck avoiding thyroid, and all intact, functional lymphatic pathways at B anterior axillary and inguinal anastomoses. Pt completed diaphragmatic breathing.                 OT Education - 03/25/16 1449    Education provided No             OT Long Term Goals - 02/02/16 1536  OT LONG TERM GOAL #1   Title Pt. will demonstrate independent A/E use for LE dressing.    Baseline unable   Time 8   Period Weeks   Status On-going     OT LONG TERM GOAL #2   Title Pt. will be independent with a toilet use for more thorough toilet hygiene care.   Baseline unable   Time 8   Period Weeks   Status On-going     OT LONG TERM GOAL #3   Title Pt. will  be demonstrate energy conservation/work simplification techniques for IADL tasks 100% of the time.     Baseline unable   Time 8   Period Weeks   Status On-going     OT LONG TERM GOAL #4   Title Pt. will demonstrate safe shower transfers using DME with supervision   Baseline unable   Time 8   Period Weeks   Status On-going     OT LONG TERM GOAL #5   Title Pt.  will increase right grip strength by 3# to assist with opening jar, and holding utensils.   Baseline drops items from hand occassionally   Time 8   Period Weeks   Status On-going     OT LONG TERM GOAL #6   Title Pt. will improve right hand coordination by 3 sec. to be able grasp and hold grooming items.    Baseline 1 min & 2sec. on the 9-hole peg test   Time 8   Period Weeks   Status On-going     OT LONG TERM GOAL #7   Title Lymphedema (LE) self-care: Pt able to apply thigh-length, multi layered, gradient compression wraps with maximum caregiver assistance within 2 weeks to achieve optimal limb volume reduction.  his goal was unrealistic as  Pt required MAX caregiver assistance to access compression wraps for volume reduction, and caregiver's own health problems limitted her ability to apply compression. GOAL DISCHARGED   Baseline dependent   Time 2   Period Weeks   Status Not Met     OT LONG TERM GOAL #8   Title Lymphedema care:  Pt to achieve at least 10% BLE limb volume reductions during Intensive CDT to limit LE progression, decrease infection risk and fall risk, to reduce pain/ discomfort, and to improve safe ambulation and functional mobility. This goal was unrealistic as  Pt required MAX caregiver assistance to access compression wraps for volume reduction, and caregiver's own health problems limitted her ability to apply compression. GOAL DISCHARGED   Baseline dependent   Time 8   Period Weeks   Status Not Met     OT LONG TERM GOAL  #9   Baseline Lymphedema care: Pt >/= 85 % compliant with all daily, LE self-care protocols for home program, including simple self-manual lymphatic drainage (MLD), skin care, lymphatic pumping the ex, skin care, and donning/ doffing compression wraps and garments with needed level of caregiver assistance to limit LE progression and further functional decline. (Baseline: dependent)     Time 8   Period Weeks   Status Partially Met     OT LONG TERM  GOAL  #10   TITLE Lymphedema care: Pt to tolerate light, off the shelf, knee length compression stockings for daytime use  in keeping w/ prescribed wear regime within 1 week of issue date to retain clinical and functional gains and to limit progression.   Baseline dependent   Time 8   Period Weeks   Status Revised  Plan - 03/25/16 1450    Clinical Impression Statement OT will f/u w/ status of pneumatic sequential device regarding insurance benefits and will clarify for Pt. Next visit is our last scheduled session. Will review progress towards goals and consider DC as appropriate.    Rehab Potential Good   Clinical Impairments Affecting Rehab Potential Postive indicators: age; Negative Indicators: multiple comorbidities      Katie Baker will benefit from skilled therapeutic intervention in order to improve the following deficits and impairments:  Decreased range of motion, Decreased knowledge of use of DME, Decreased skin integrity, Increased edema, Pain, Impaired flexibility, Decreased mobility, Impaired sensation, Decreased endurance, Decreased balance, Decreased knowledge of precautions, Difficulty walking, Impaired perceived functional ability  Visit Diagnosis: Lymphedema, not elsewhere classified    Problem List Katie Baker Active Problem List   Diagnosis Date Noted  . Gout 10/26/2015  . [facility] (congenital dislocation of the hip) 06/19/2015  . HLD (hyperlipidemia) 06/19/2015  . BP (high blood pressure) 06/19/2015  . Adiposity 06/19/2015  . Borderline diabetes mellitus 06/19/2015  . Obstructive apnea 06/19/2015  . H/O malignant neoplasm of thyroid 07/15/2014  . Hypothyroidism, postop 07/15/2014  . Degeneration of intervertebral disc of lumbar region 03/18/2013  . Fibromyalgia 03/18/2013  . LBP (low back pain) 03/18/2013    Ansel Bong 03/25/2016, 2:53 PM  [place] MAIN [facility] SERVICES [address] [place],  [place], [address] Phone: [phone]   Fax:  [phone]  Name: COCO SHARPNACK MRN: [id number] Date of Birth: 1961/01/24

## 2016-03-25 NOTE — Therapy (Signed)
[place] MAIN [facility] SERVICES [address] [place], [place], [address] Phone: [phone]   Fax:  [phone]  Occupational Therapy Treatment  Patient Details  Name: Katie Baker MRN: [id number] Date of Birth: 1961-05-20 Referring Provider: Dr. Rayann Heman  Encounter Date: 03/25/2016      OT End of Session - 03/25/16 1449    Visit Number 15   Number of Visits 16   Date for OT Re-Evaluation 03/14/16   OT Start Time 1035   OT Stop Time 1115   OT Time Calculation (min) 40 min   Activity Tolerance Patient tolerated treatment well;No increased pain   Behavior During Therapy WFL for tasks assessed/performed      Past Medical History:  Diagnosis Date  . Diabetes mellitus, type II ([place])   . Dislocation of hip, congenital   . Fibromyalgia   . Hyperlipidemia   . Hypertension   . Hypothyroidism   . Obesity   . Prediabetes   . Reflux   . Sleep apnea   . Spinal stenosis   . Tendonitis   . Thyroid cancer [facility])     Past Surgical History:  Procedure Laterality Date  . COLONOSCOPY WITH PROPOFOL N/A 02/09/2015   Procedure: COLONOSCOPY WITH PROPOFOL;  Surgeon: [name], MD;  Location: [facility] ENDOSCOPY;  Service: Endoscopy;  Laterality: N/A;  . ESOPHAGOGASTRODUODENOSCOPY N/A 02/09/2015   Procedure: ESOPHAGOGASTRODUODENOSCOPY (EGD);  Surgeon: [name], MD;  Location: [facility] ENDOSCOPY;  Service: Endoscopy;  Laterality: N/A;  . HIP SURGERY    . THYROID SURGERY      There were no vitals filed for this visit.      Subjective Assessment - 03/25/16 1446    Subjective  Pt. presents for OT Lymphedema visit 15 to address B lower and upper quadrant limb swelling and pain. Pt has no new complaints. She tells me she had neck injection recentlly and is hopeful it will give her pain releif. Pt states she heard from pump vendor but is unsure what the next step is for procuring device.   Patient is accompained by: Family member   Pertinent  History Pt. is a 55 y.o. female who presents with an extensive medical history which hinders her ability to perform  ADL and IADL tasks. This pt.currently resides at home alone, and relies on familiy to assist with ADLs, IADLs, and transportation.    Limitations difficulty stnding and walking, decreased balance, hx frequent falls, difficulty fitting street shoes 2/2 leg swelling, chronic pain, decreased BLE AROM, decreased sensation in feet ( neuropathy), body assymetry,    Patient Stated Goals to decrease leg swelling and pain   Pain Onset More than a month ago                      OT Treatments/Exercises (OP) - 03/25/16 0001      Manual Therapy   Manual Therapy Edema management   Manual Lymphatic Drainage (MLD) Manual lymph drainage to BLE and BUE in supine w/ head slightly elevated and knees flexed on pillow to limit back pain. Utilized modified short neck avoiding thyroid, and all intact, functional lymphatic pathways at B anterior axillary and inguinal anastomoses. Pt completed diaphragmatic breathing.                 OT Education - 03/25/16 1449    Education provided No             OT Long Term Goals - 02/02/16 1536  OT LONG TERM GOAL #1   Title Pt. will demonstrate independent A/E use for LE dressing.    Baseline unable   Time 8   Period Weeks   Status On-going     OT LONG TERM GOAL #2   Title Pt. will be independent with a toilet use for more thorough toilet hygiene care.   Baseline unable   Time 8   Period Weeks   Status On-going     OT LONG TERM GOAL #3   Title Pt. will  be demonstrate energy conservation/work simplification techniques for IADL tasks 100% of the time.     Baseline unable   Time 8   Period Weeks   Status On-going     OT LONG TERM GOAL #4   Title Pt. will demonstrate safe shower transfers using DME with supervision   Baseline unable   Time 8   Period Weeks   Status On-going     OT LONG TERM GOAL #5   Title Pt.  will increase right grip strength by 3# to assist with opening jar, and holding utensils.   Baseline drops items from hand occassionally   Time 8   Period Weeks   Status On-going     OT LONG TERM GOAL #6   Title Pt. will improve right hand coordination by 3 sec. to be able grasp and hold grooming items.    Baseline 1 min & 2sec. on the 9-hole peg test   Time 8   Period Weeks   Status On-going     OT LONG TERM GOAL #7   Title Lymphedema (LE) self-care: Pt able to apply thigh-length, multi layered, gradient compression wraps with maximum caregiver assistance within 2 weeks to achieve optimal limb volume reduction.  his goal was unrealistic as  Pt required MAX caregiver assistance to access compression wraps for volume reduction, and caregiver's own health problems limitted her ability to apply compression. GOAL DISCHARGED   Baseline dependent   Time 2   Period Weeks   Status Not Met     OT LONG TERM GOAL #8   Title Lymphedema care:  Pt to achieve at least 10% BLE limb volume reductions during Intensive CDT to limit LE progression, decrease infection risk and fall risk, to reduce pain/ discomfort, and to improve safe ambulation and functional mobility. This goal was unrealistic as  Pt required MAX caregiver assistance to access compression wraps for volume reduction, and caregiver's own health problems limitted her ability to apply compression. GOAL DISCHARGED   Baseline dependent   Time 8   Period Weeks   Status Not Met     OT LONG TERM GOAL  #9   Baseline Lymphedema care: Pt >/= 85 % compliant with all daily, LE self-care protocols for home program, including simple self-manual lymphatic drainage (MLD), skin care, lymphatic pumping the ex, skin care, and donning/ doffing compression wraps and garments with needed level of caregiver assistance to limit LE progression and further functional decline. (Baseline: dependent)     Time 8   Period Weeks   Status Partially Met     OT LONG TERM  GOAL  #10   TITLE Lymphedema care: Pt to tolerate light, off the shelf, knee length compression stockings for daytime use  in keeping w/ prescribed wear regime within 1 week of issue date to retain clinical and functional gains and to limit progression.   Baseline dependent   Time 8   Period Weeks   Status Revised  Plan - 03/25/16 1450    Clinical Impression Statement OT will f/u w/ status of pneumatic sequential device regarding insurance benefits and will clarify for Pt. Next visit is our last scheduled session. Will review progress towards goals and consider DC as appropriate.    Rehab Potential Good   Clinical Impairments Affecting Rehab Potential Postive indicators: age; Negative Indicators: multiple comorbidities      Patient will benefit from skilled therapeutic intervention in order to improve the following deficits and impairments:  Decreased range of motion, Decreased knowledge of use of DME, Decreased skin integrity, Increased edema, Pain, Impaired flexibility, Decreased mobility, Impaired sensation, Decreased endurance, Decreased balance, Decreased knowledge of precautions, Difficulty walking, Impaired perceived functional ability  Visit Diagnosis: Lymphedema, not elsewhere classified    Problem List Patient Active Problem List   Diagnosis Date Noted  . Gout 10/26/2015  . [facility] (congenital dislocation of the hip) 06/19/2015  . HLD (hyperlipidemia) 06/19/2015  . BP (high blood pressure) 06/19/2015  . Adiposity 06/19/2015  . Borderline diabetes mellitus 06/19/2015  . Obstructive apnea 06/19/2015  . H/O malignant neoplasm of thyroid 07/15/2014  . Hypothyroidism, postop 07/15/2014  . Degeneration of intervertebral disc of lumbar region 03/18/2013  . Fibromyalgia 03/18/2013  . LBP (low back pain) 03/18/2013    Ansel Bong 03/25/2016, 2:53 PM  [place] MAIN [facility] SERVICES [address] [place],  [place], [address] Phone: [phone]   Fax:  [phone]  Name: Katie Baker MRN: [id number] Date of Birth: 01-03-1961

## 2016-03-25 NOTE — Patient Instructions (Signed)
LE instructions and precautions as established- see initial eval.   

## 2016-04-01 ENCOUNTER — Encounter: Payer: Medicare Other | Admitting: Occupational Therapy

## 2016-08-29 DIAGNOSIS — M25511 Pain in right shoulder: Secondary | ICD-10-CM | POA: Insufficient documentation

## 2016-09-27 ENCOUNTER — Other Ambulatory Visit: Payer: Self-pay | Admitting: Physician Assistant

## 2016-09-27 DIAGNOSIS — M545 Low back pain, unspecified: Secondary | ICD-10-CM

## 2016-09-27 DIAGNOSIS — M5415 Radiculopathy, thoracolumbar region: Secondary | ICD-10-CM

## 2016-10-05 ENCOUNTER — Ambulatory Visit
Admission: RE | Admit: 2016-10-05 | Discharge: 2016-10-05 | Disposition: A | Discharge status: Home / Self Care | Payer: Medicare Other | Source: Ambulatory Visit | Attending: Physician Assistant | Admitting: Physician Assistant

## 2016-10-05 DIAGNOSIS — M545 Low back pain, unspecified: Secondary | ICD-10-CM

## 2016-10-05 DIAGNOSIS — Z96643 Presence of artificial hip joint, bilateral: Secondary | ICD-10-CM | POA: Insufficient documentation

## 2016-10-05 DIAGNOSIS — M6258 Muscle wasting and atrophy, not elsewhere classified, other site: Secondary | ICD-10-CM | POA: Insufficient documentation

## 2016-10-05 DIAGNOSIS — M5124 Other intervertebral disc displacement, thoracic region: Secondary | ICD-10-CM | POA: Insufficient documentation

## 2016-10-05 DIAGNOSIS — M1288 Other specific arthropathies, not elsewhere classified, other specified site: Secondary | ICD-10-CM | POA: Diagnosis not present

## 2016-10-05 DIAGNOSIS — M542 Cervicalgia: Secondary | ICD-10-CM | POA: Diagnosis present

## 2016-10-05 DIAGNOSIS — M5415 Radiculopathy, thoracolumbar region: Secondary | ICD-10-CM

## 2016-10-25 ENCOUNTER — Ambulatory Visit: Payer: Medicare Other | Attending: Physician Assistant

## 2016-10-25 NOTE — Therapy (Signed)
[place] MAIN [facility] SERVICES [address] [place], [place], [address] Phone: [phone]   Fax:  [phone]  Patient Details  Name: Katie Baker MRN: [id number] Date of Birth: [birth year] Referring Provider:  No ref. provider found  Encounter Date: 12/12/2015   Chart  opened in error.    Jerl Mina ,PT, DPT, E-RYT  10/25/2016, 2:27 PM  [place] MAIN [facility] SERVICES [address] [place], [place], [address] Phone: [phone]   Fax:  [phone]

## 2016-10-28 ENCOUNTER — Ambulatory Visit: Payer: Medicare Other

## 2016-12-02 IMAGING — MR MR HEAD WO/W CM
12 series · 48 of 48 positions shown · IV contrast (multihance)
Comparison: Report from prior MR 07/16/2008.  Images not available.

CLINICAL DATA: 54-year-old hypertensive female with history of
thyroid cancer. Since thyroid surgery 4 years ago patient having
headaches with nausea tinnitus both the years getting progressively
worse. Subsequent encounter.

EXAM:
MRI HEAD WITHOUT AND WITH CONTRAST
TECHNIQUE: Multiplanar, multiecho pulse sequences of the brain and surrounding
structures were obtained without and with intravenous contrast.
CONTRAST:  15mL MULTIHANCE GADOBENATE DIMEGLUMINE 529 MG/ML IV SOLN

[Series 2: T1 · sagittal · 5.0mm · 0.45mm/px · 3 of 23 slices shown (1 of 2)]
[im 1/23]
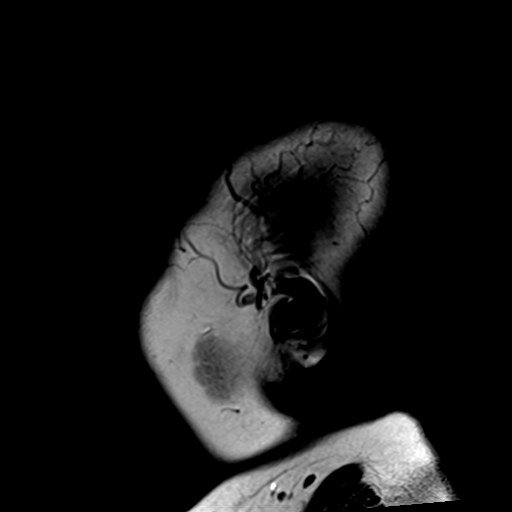
[im 12/23]
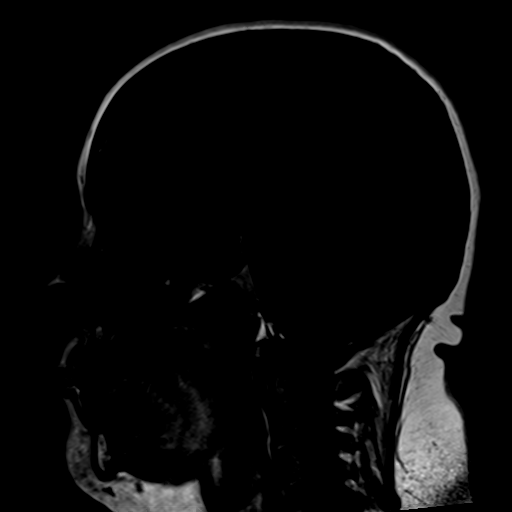
[im 23/23]
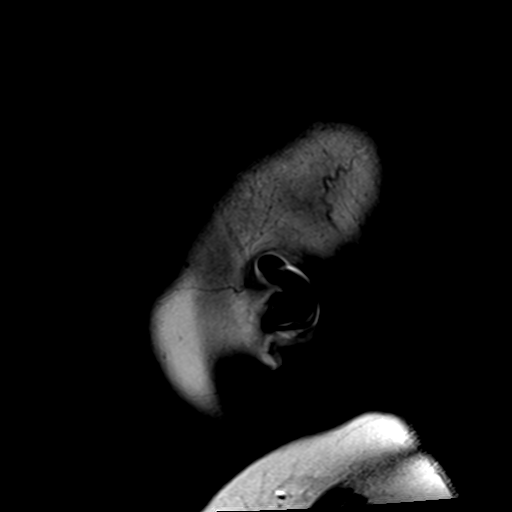

[Series 4: DWI · axial · 3.0mm · 1.20mm/px · z∈[-79,+83]mm · 6 of 55 slices shown (1 of 4)]
[im 1/55]
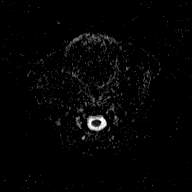
[im 11/55]
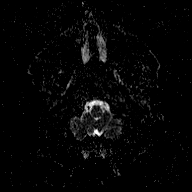
[im 22/55]
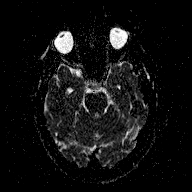
[im 33/55]
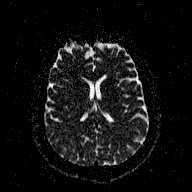
[im 44/55]
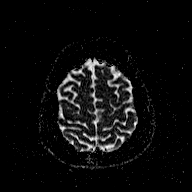
[im 55/55]
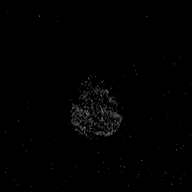

[Series 6: DWI · coronal · 3.0mm · 1.20mm/px · 5 of 49 slices shown (2 of 4)]
[im 1/49]
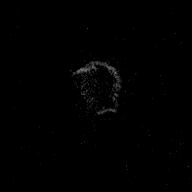
[im 13/49]
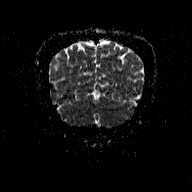
[im 25/49]
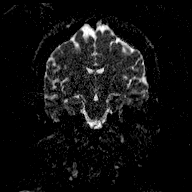
[im 37/49]
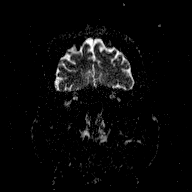
[im 49/49]
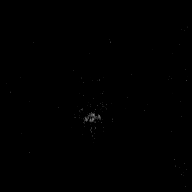

[Series 7: T2 · axial · 5.0mm · 0.72mm/px · z∈[-70,+85]mm · 2 of 25 slices shown (1 of 2)]
[im 1/25]
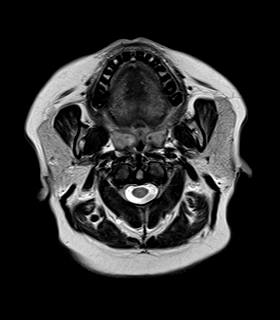
[im 25/25]
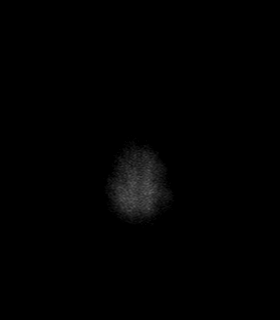

[Series 8: FLAIR · axial · 5.0mm · 0.45mm/px · z∈[-70,+85]mm · 2 of 25 slices shown]
[im 1/25]
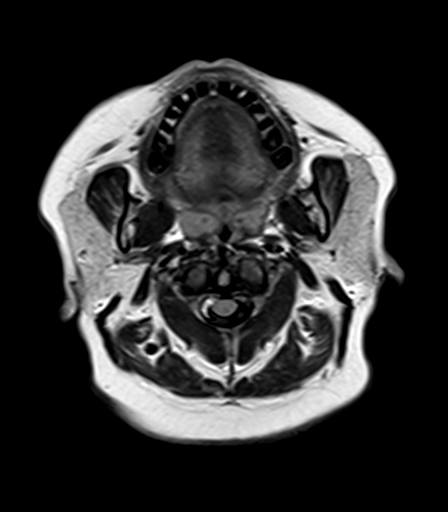
[im 25/25]
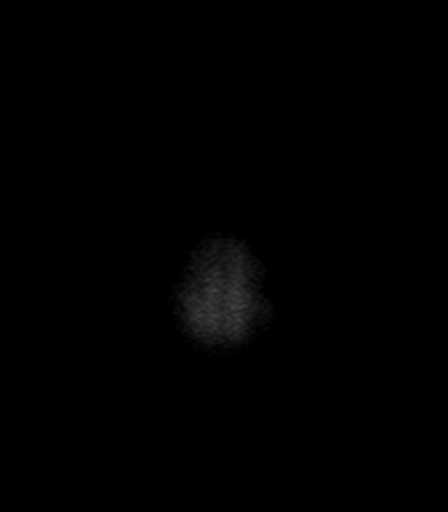

[Series 9: T2 · axial · 5.0mm · 0.72mm/px · z∈[-70,+85]mm · 2 of 25 slices shown (2 of 2)]
[im 1/25]
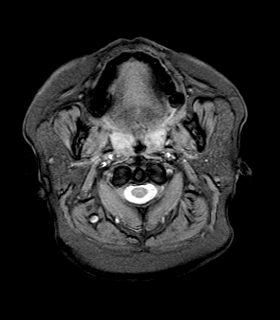
[im 25/25]
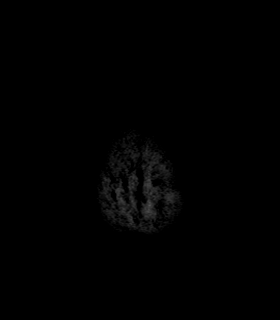

[Series 10: T1 · axial · 3.0mm · 1.00mm/px · z∈[-86,+103]mm · 6 of 64 slices shown (2 of 2)]
[im 1/64]
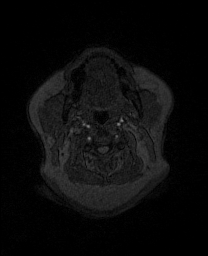
[im 13/64]
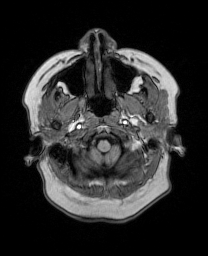
[im 26/64]
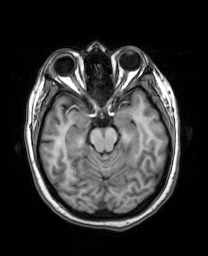
[im 38/64]
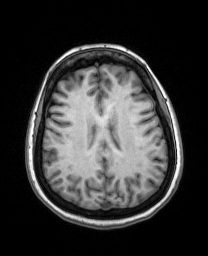
[im 51/64]
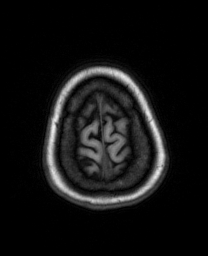
[im 64/64]
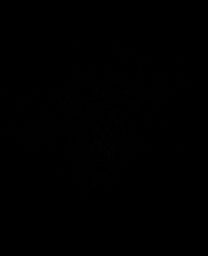

[Series 11: T2 post-contrast · coronal · 5.0mm · 0.45mm/px · 3 of 29 slices shown]
[im 1/29]
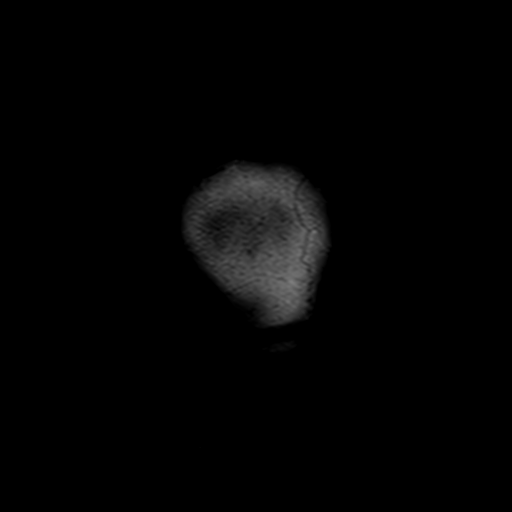
[im 15/29]
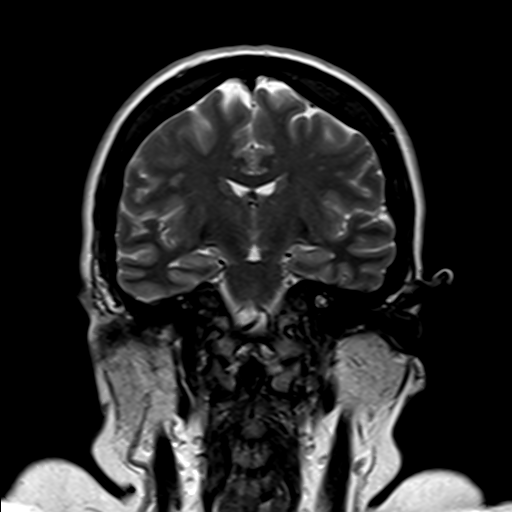
[im 29/29]
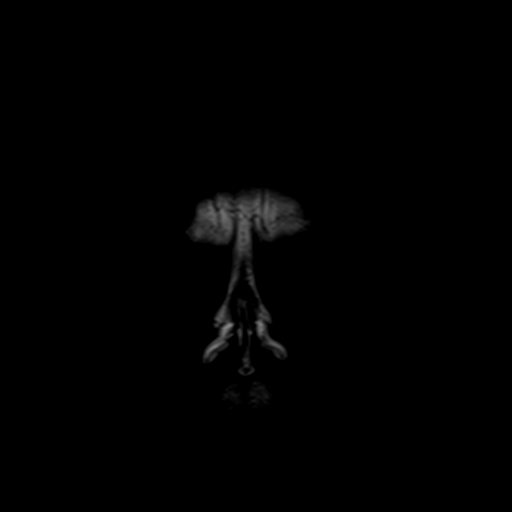

[Series 12: T1 post-contrast · axial · 3.0mm · 1.00mm/px · z∈[-86,+103]mm · 6 of 64 slices shown (1 of 2)]
[im 1/64]
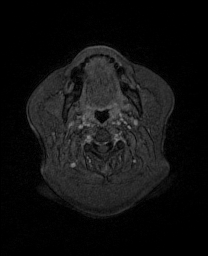
[im 13/64]
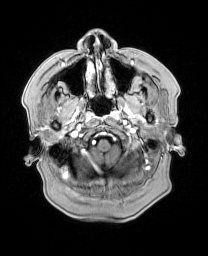
[im 26/64]
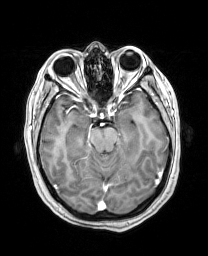
[im 38/64]
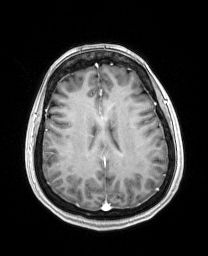
[im 51/64]
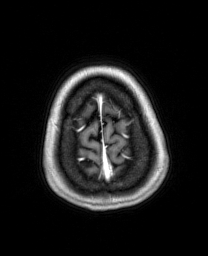
[im 64/64]
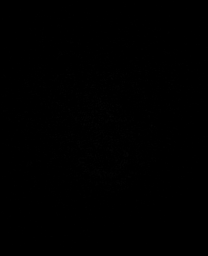

[Series 13: T1 post-contrast · coronal · 5.0mm · 0.45mm/px · 3 of 29 slices shown (2 of 2)]
[im 1/29]
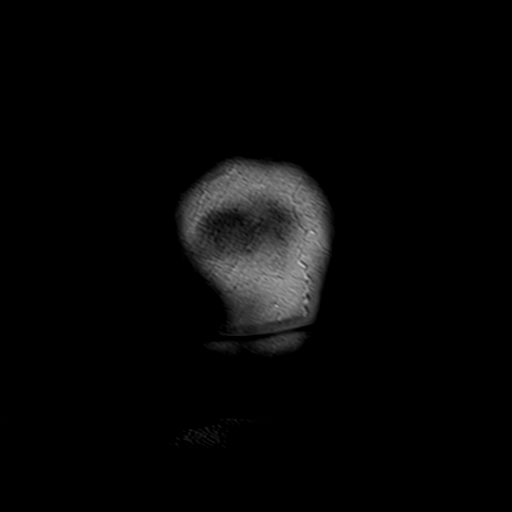
[im 15/29]
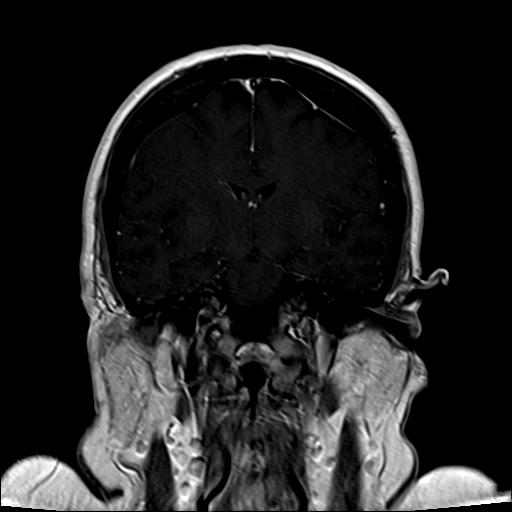
[im 29/29]
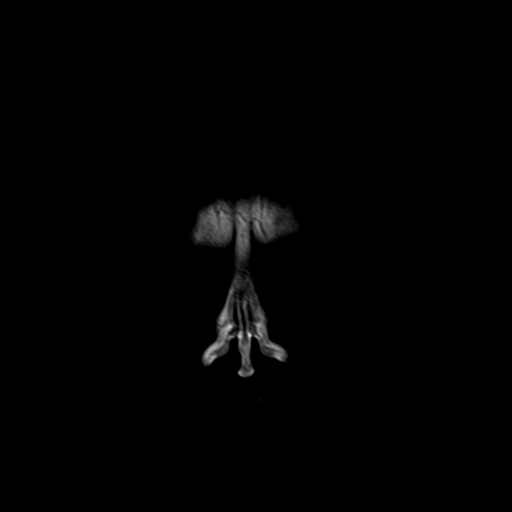

[Series 100: DWI · axial · 3.0mm · 1.20mm/px · z∈[-79,+83]mm · 5 of 55 slices shown (3 of 4)]
[im 1/55]
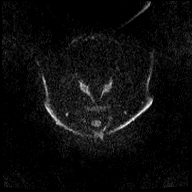
[im 14/55]
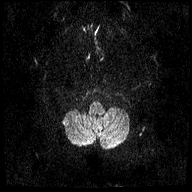
[im 28/55]
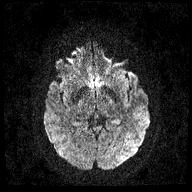
[im 41/55]
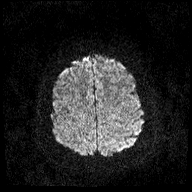
[im 55/55]
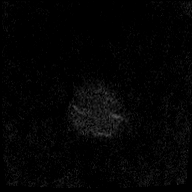

[Series 101: DWI · coronal · 3.0mm · 1.20mm/px · 5 of 48 slices shown (4 of 4)]
[im 1/48]
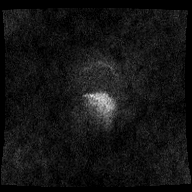
[im 12/48]
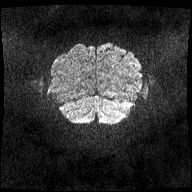
[im 24/48]
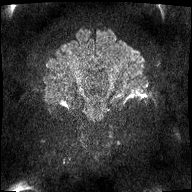
[im 36/48]
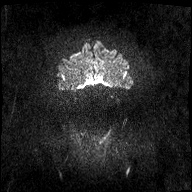
[im 48/48]
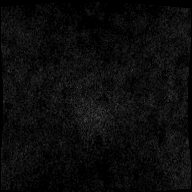

[48 of 48 positions shown; findings below may reference images not displayed]

FINDINGS: No acute infarct.

No intracranial hemorrhage.

No intracranial mass or abnormal enhancement.

No hydrocephalus.

Scattered punctate and patchy white matter type changes which in a
patient of this habitus and history of pre diabetes and hypertension
is most suggestive of result of small vessel disease. Other causes
of white matter type changes such as that secondary to vasculitis,
demyelinating process, inflammatory process or migraine headaches
are secondary considerations.

No abnormal vasculature noted with major intracranial vascular
structures patent.

Cervical medullary junction, pituitary region, pineal region and
orbital structures unremarkable.

Decreased signal intensity of bone marrow may be related to
patient's habitus. Correlation with CBC to exclude anemia
contributing to this appearance may be considered

Cervical spondylotic changes with spinal stenosis C2-3 through C5-6
incompletely assessed on present exam.
IMPRESSION: No intracranial mass or abnormal enhancement.

Scattered white matter type changes probably related to result of
small vessel disease as discussed above.

Decreased signal intensity of bone marrow may be related to
patient's habitus. Correlation with CBC to exclude anemia
contributing to this appearance may be considered

Cervical spondylotic changes with spinal stenosis C2-3 through C5-6
incompletely assessed on present exam.

## 2017-01-31 DIAGNOSIS — I2699 Other pulmonary embolism without acute cor pulmonale: Secondary | ICD-10-CM

## 2017-01-31 HISTORY — DX: Other pulmonary embolism without acute cor pulmonale: I26.99

## 2017-02-12 ENCOUNTER — Encounter: Payer: Self-pay | Admitting: Emergency Medicine

## 2017-02-12 ENCOUNTER — Emergency Department: Payer: Medicare Other

## 2017-02-12 ENCOUNTER — Inpatient Hospital Stay (HOSPITAL_COMMUNITY)
Admit: 2017-02-12 | Discharge: 2017-02-12 | Disposition: A | Discharge status: Home / Self Care | Payer: Medicare Other | Attending: Internal Medicine | Admitting: Internal Medicine

## 2017-02-12 ENCOUNTER — Inpatient Hospital Stay
Admission: EM | Admit: 2017-02-12 | Discharge: 2017-02-18 | DRG: 167 | Disposition: A | Discharge status: Skilled Nursing Facility | Payer: Medicare Other | Attending: Internal Medicine | Admitting: Internal Medicine

## 2017-02-12 DIAGNOSIS — M79606 Pain in leg, unspecified: Secondary | ICD-10-CM

## 2017-02-12 DIAGNOSIS — M109 Gout, unspecified: Secondary | ICD-10-CM | POA: Diagnosis present

## 2017-02-12 DIAGNOSIS — Z833 Family history of diabetes mellitus: Secondary | ICD-10-CM | POA: Diagnosis not present

## 2017-02-12 DIAGNOSIS — Z8249 Family history of ischemic heart disease and other diseases of the circulatory system: Secondary | ICD-10-CM

## 2017-02-12 DIAGNOSIS — K219 Gastro-esophageal reflux disease without esophagitis: Secondary | ICD-10-CM | POA: Diagnosis present

## 2017-02-12 DIAGNOSIS — M797 Fibromyalgia: Secondary | ICD-10-CM | POA: Diagnosis present

## 2017-02-12 DIAGNOSIS — K5900 Constipation, unspecified: Secondary | ICD-10-CM | POA: Diagnosis not present

## 2017-02-12 DIAGNOSIS — E669 Obesity, unspecified: Secondary | ICD-10-CM | POA: Diagnosis present

## 2017-02-12 DIAGNOSIS — Z85850 Personal history of malignant neoplasm of thyroid: Secondary | ICD-10-CM

## 2017-02-12 DIAGNOSIS — E785 Hyperlipidemia, unspecified: Secondary | ICD-10-CM | POA: Diagnosis present

## 2017-02-12 DIAGNOSIS — Z820 Family history of epilepsy and other diseases of the nervous system: Secondary | ICD-10-CM

## 2017-02-12 DIAGNOSIS — Z7951 Long term (current) use of inhaled steroids: Secondary | ICD-10-CM | POA: Diagnosis not present

## 2017-02-12 DIAGNOSIS — I82411 Acute embolism and thrombosis of right femoral vein: Secondary | ICD-10-CM | POA: Diagnosis present

## 2017-02-12 DIAGNOSIS — E039 Hypothyroidism, unspecified: Secondary | ICD-10-CM | POA: Diagnosis present

## 2017-02-12 DIAGNOSIS — Z823 Family history of stroke: Secondary | ICD-10-CM

## 2017-02-12 DIAGNOSIS — I10 Essential (primary) hypertension: Secondary | ICD-10-CM | POA: Diagnosis present

## 2017-02-12 DIAGNOSIS — Z86711 Personal history of pulmonary embolism: Secondary | ICD-10-CM

## 2017-02-12 DIAGNOSIS — G4730 Sleep apnea, unspecified: Secondary | ICD-10-CM | POA: Diagnosis present

## 2017-02-12 DIAGNOSIS — I5030 Unspecified diastolic (congestive) heart failure: Secondary | ICD-10-CM

## 2017-02-12 DIAGNOSIS — M7989 Other specified soft tissue disorders: Secondary | ICD-10-CM | POA: Insufficient documentation

## 2017-02-12 DIAGNOSIS — G8929 Other chronic pain: Secondary | ICD-10-CM | POA: Diagnosis present

## 2017-02-12 DIAGNOSIS — I2699 Other pulmonary embolism without acute cor pulmonale: Secondary | ICD-10-CM

## 2017-02-12 DIAGNOSIS — Z6839 Body mass index (BMI) 39.0-39.9, adult: Secondary | ICD-10-CM

## 2017-02-12 DIAGNOSIS — Z8349 Family history of other endocrine, nutritional and metabolic diseases: Secondary | ICD-10-CM | POA: Diagnosis not present

## 2017-02-12 DIAGNOSIS — R079 Chest pain, unspecified: Secondary | ICD-10-CM

## 2017-02-12 DIAGNOSIS — E119 Type 2 diabetes mellitus without complications: Secondary | ICD-10-CM | POA: Diagnosis present

## 2017-02-12 DIAGNOSIS — Z8052 Family history of malignant neoplasm of bladder: Secondary | ICD-10-CM | POA: Diagnosis not present

## 2017-02-12 DIAGNOSIS — R0781 Pleurodynia: Secondary | ICD-10-CM | POA: Insufficient documentation

## 2017-02-12 DIAGNOSIS — R0602 Shortness of breath: Secondary | ICD-10-CM | POA: Diagnosis not present

## 2017-02-12 DIAGNOSIS — Z811 Family history of alcohol abuse and dependence: Secondary | ICD-10-CM

## 2017-02-12 DIAGNOSIS — I82409 Acute embolism and thrombosis of unspecified deep veins of unspecified lower extremity: Secondary | ICD-10-CM | POA: Diagnosis not present

## 2017-02-12 LAB — BASIC METABOLIC PANEL
Anion gap: 10 (ref 5–15)
BUN: 5 mg/dL — AB (ref 6–20)
CALCIUM: 9.1 mg/dL (ref 8.9–10.3)
CO2: 26 mmol/L (ref 22–32)
CREATININE: 0.5 mg/dL (ref 0.44–1.00)
Chloride: 102 mmol/L (ref 101–111)
GFR calc Af Amer: >60 mL/min (ref >60)
GFR calc non Af Amer: >60 mL/min (ref >60)
GLUCOSE: 128 mg/dL — AB (ref 65–99)
Potassium: 3.2 mmol/L — ABNORMAL LOW (ref 3.5–5.1)
Sodium: 138 mmol/L (ref 135–145)

## 2017-02-12 LAB — PROTIME-INR
INR: 0.96
Prothrombin Time: 12.8 seconds (ref 11.4–15.2)

## 2017-02-12 LAB — GLUCOSE, CAPILLARY
GLUCOSE-CAPILLARY: 117 mg/dL — AB (ref 65–99)
Glucose-Capillary: 103 mg/dL — ABNORMAL HIGH (ref 65–99)

## 2017-02-12 LAB — CBC
HCT: 44.1 % (ref 35.0–47.0)
Hemoglobin: 14.8 g/dL (ref 12.0–16.0)
MCH: 29 pg (ref 26.0–34.0)
MCHC: 33.5 g/dL (ref 32.0–36.0)
MCV: 86.6 fL (ref 80.0–100.0)
PLATELETS: 219 10*3/uL (ref 150–440)
RBC: 5.1 MIL/uL (ref 3.80–5.20)
RDW: 13.4 % (ref 11.5–14.5)
WBC: 10.2 10*3/uL (ref 3.6–11.0)

## 2017-02-12 LAB — FIBRIN DERIVATIVES D-DIMER (ARMC ONLY): Fibrin derivatives D-dimer (ARMC): 5353.39 — ABNORMAL HIGH (ref 0.00–499.00)

## 2017-02-12 LAB — TROPONIN I

## 2017-02-12 LAB — APTT: aPTT: 24 seconds (ref 24–36)

## 2017-02-12 MED ORDER — DOCUSATE SODIUM 100 MG PO CAPS: 100.0000 mg | ORAL_CAPSULE | Freq: Two times a day (BID) | ORAL | Status: DC | PRN

## 2017-02-12 MED ORDER — GABAPENTIN 300 MG PO CAPS
300.0000 mg | ORAL_CAPSULE | Freq: Two times a day (BID) | ORAL | Status: DC
Start: 2017-02-12 — End: 2017-02-18
  Administered 2017-02-12 – 2017-02-18 (×11): 300 mg via ORAL
  Filled 2017-02-12 (×13): qty 1

## 2017-02-12 MED ORDER — LEVOTHYROXINE SODIUM 75 MCG PO TABS: 150.0000 ug | ORAL_TABLET | Freq: Every day | ORAL | Status: DC

## 2017-02-12 MED ORDER — FLUTICASONE PROPIONATE 50 MCG/ACT NA SUSP
1.0000 | Freq: Every day | NASAL | Status: DC | PRN
Start: 2017-02-12 — End: 2017-02-18
  Administered 2017-02-15: 1 via NASAL
  Filled 2017-02-12: qty 16

## 2017-02-12 MED ORDER — FAMOTIDINE 20 MG PO TABS
20.0000 mg | ORAL_TABLET | Freq: Two times a day (BID) | ORAL | Status: DC
  Administered 2017-02-12 – 2017-02-18 (×11): 20 mg via ORAL
  Filled 2017-02-12 (×12): qty 1

## 2017-02-12 MED ORDER — DICYCLOMINE HCL 10 MG PO CAPS
10.0000 mg | ORAL_CAPSULE | Freq: Three times a day (TID) | ORAL | Status: DC
  Administered 2017-02-12 – 2017-02-18 (×22): 10 mg via ORAL
  Filled 2017-02-12 (×23): qty 1

## 2017-02-12 MED ORDER — DIAZEPAM 5 MG PO TABS
5.0000 mg | ORAL_TABLET | Freq: Two times a day (BID) | ORAL | Status: DC | PRN
Start: 2017-02-12 — End: 2017-02-18
  Administered 2017-02-12 – 2017-02-14 (×3): 5 mg via ORAL
  Filled 2017-02-12 (×3): qty 1

## 2017-02-12 MED ORDER — HEPARIN BOLUS VIA INFUSION
4000.0000 [IU] | Freq: Once | INTRAVENOUS | Status: AC
  Administered 2017-02-12: 4000 [IU] via INTRAVENOUS
  Filled 2017-02-12: qty 4000

## 2017-02-12 MED ORDER — LINAGLIPTIN 5 MG PO TABS
5.0000 mg | ORAL_TABLET | Freq: Every day | ORAL | Status: DC
  Administered 2017-02-13 – 2017-02-18 (×6): 5 mg via ORAL
  Filled 2017-02-12 (×6): qty 1

## 2017-02-12 MED ORDER — IOPAMIDOL (ISOVUE-370) INJECTION 76%
75.0000 mL | Freq: Once | INTRAVENOUS | Status: AC | PRN
  Administered 2017-02-12: 75 mL via INTRAVENOUS
  Filled 2017-02-12: qty 75

## 2017-02-12 MED ORDER — LINACLOTIDE 72 MCG PO CAPS
72.0000 ug | ORAL_CAPSULE | Freq: Every day | ORAL | Status: DC
  Administered 2017-02-14 – 2017-02-18 (×5): 72 ug via ORAL
  Filled 2017-02-12 (×6): qty 1

## 2017-02-12 MED ORDER — CYCLOSPORINE 0.05 % OP EMUL
1.0000 [drp] | Freq: Two times a day (BID) | OPHTHALMIC | Status: DC
  Administered 2017-02-12 – 2017-02-18 (×11): 1 [drp] via OPHTHALMIC
  Filled 2017-02-12 (×13): qty 1

## 2017-02-12 MED ORDER — INSULIN ASPART 100 UNIT/ML ~~LOC~~ SOLN
0.0000 [IU] | Freq: Three times a day (TID) | SUBCUTANEOUS | Status: DC
  Administered 2017-02-13 – 2017-02-18 (×8): 1 [IU] via SUBCUTANEOUS
  Filled 2017-02-12 (×9): qty 1

## 2017-02-12 MED ORDER — MORPHINE SULFATE ER 15 MG PO TBCR
15.0000 mg | EXTENDED_RELEASE_TABLET | Freq: Two times a day (BID) | ORAL | Status: DC
  Administered 2017-02-12 – 2017-02-18 (×12): 15 mg via ORAL
  Filled 2017-02-12 (×12): qty 1

## 2017-02-12 MED ORDER — PANTOPRAZOLE SODIUM 40 MG PO TBEC
40.0000 mg | DELAYED_RELEASE_TABLET | Freq: Every day | ORAL | Status: DC
  Administered 2017-02-12 – 2017-02-18 (×7): 40 mg via ORAL
  Filled 2017-02-12 (×7): qty 1

## 2017-02-12 MED ORDER — HEPARIN (PORCINE) IN NACL 100-0.45 UNIT/ML-% IJ SOLN
1150.0000 [IU]/h | INTRAMUSCULAR | Status: DC
  Administered 2017-02-12 – 2017-02-14 (×4): 1050 [IU]/h via INTRAVENOUS
  Filled 2017-02-12 (×3): qty 250

## 2017-02-12 MED ORDER — DIPHENHYDRAMINE HCL 25 MG PO CAPS: 25.0000 mg | ORAL_CAPSULE | Freq: Every evening | ORAL | Status: DC | PRN

## 2017-02-12 NOTE — ED Notes (Signed)
Pt with right posterior calf tenderness x 1 week. Not on blood thinners.

## 2017-02-12 NOTE — H&P (Signed)
[place] [facility] [place] NAME: Katie Baker    MR#:  [id number]  DATE OF BIRTH:  Feb 17, 1961  DATE OF ADMISSION:  02/12/2017  PRIMARY CARE PHYSICIAN: Perrin Maltese, MD   REQUESTING/REFERRING PHYSICIAN: Archie Balboa  CHIEF COMPLAINT:   Chief Complaint  Patient presents with  . Chest Pain    HISTORY OF PRESENT ILLNESS: Katie Baker  is a 56 y.o. female with a known history of Diabetes, fibromyalgia, hyperlipidemia, hypertension, obesity, spinal stenosis, thyroid cancer- due to other multiple hip surgeries issues and replacements not able to walk steady so using a walker at home and not much mobile. Last 2 weeks she is having shortness of breath and diagnosing a chest images progressively getting worse and now also noted her legs are swollen for the last 3-4 days. SEL 1 mm the rheumatologist, went to the clinic and talked with her doctor about his complaints and they suggested to go to emergency room pediatric ER with CT angiogram she was noted to have bilateral pulmonary emboli with large clot burden, so advised to admit to the hospitalist team.  PAST MEDICAL HISTORY:   Past Medical History:  Diagnosis Date  . Diabetes mellitus, type II (Katie Baker)   . Dislocation of hip, congenital   . Fibromyalgia   . Hyperlipidemia   . Hypertension   . Hypothyroidism   . Obesity   . Prediabetes   . Reflux   . Sleep apnea   . Spinal stenosis   . Tendonitis   . Thyroid cancer (Katie Baker)     PAST SURGICAL HISTORY: Past Surgical History:  Procedure Laterality Date  . COLONOSCOPY WITH PROPOFOL N/A 02/09/2015   Procedure: COLONOSCOPY WITH PROPOFOL;  Surgeon: [name], MD;  Location[facility] END[facility]COPY;  Service: Endoscopy;  Laterality: N/A;  . ESOPHAGOGASTRODUODENOSCOPY N/A 02/09/2015   Procedure: ESOPHAGOGASTRODUODENOSCOPY (EGD);  Surgeon: [name], MD;  Location: [facility] ENDOSCOPY;  Service: Endoscopy;  Laterality: N/A;  . HIP SURGERY    . THYROID SURGERY       SOCIAL HISTORY:  Social History  Substance Use Topics  . Smoking status: Never Smoker  . Smokeless tobacco: Never Used  . Alcohol use No    FAMILY HISTORY:  Family History  Problem Relation Age of Onset  . Heart disease Mother   . Hypertension Mother   . Alzheimer's disease Mother   . Diabetes Mother   . Stroke Mother   . Anxiety disorder Mother   . Diabetes Father   . Alcohol abuse Father   . Bladder Cancer Sister   . Thyroid disease Brother   . Diabetes Daughter   . Diabetes Maternal Aunt     DRUG ALLERGIES: No Known Allergies  REVIEW OF SYSTEMS:   CONSTITUTIONAL: No fever, fatigue or weakness.  EYES: No blurred or double vision.  EARS, NOSE, AND THROAT: No tinnitus or ear pain.  RESPIRATORY: No cough, shortness of breath, wheezing or hemoptysis.  CARDIOVASCULAR: No chest pain, orthopnea, edema.  GASTROINTESTINAL: No nausea, vomiting, diarrhea or abdominal pain.  GENITOURINARY: No dysuria, hematuria.  ENDOCRINE: No polyuria, nocturia,  HEMATOLOGY: No anemia, easy bruising or bleeding SKIN: No rash or lesion. MUSCULOSKELETAL: No joint pain or arthritis.   NEUROLOGIC: No tingling, numbness, weakness.  PSYCHIATRY: No anxiety or depression.   MEDICATIONS AT HOME:  Prior to Admission medications   Medication Sig Start Date End Date Taking? Authorizing Provider  sitaGLIPtin (JANUVIA) 100 MG tablet Take 100 mg by mouth daily. 08/30/16  Yes  [provider]  Blood Glucose Monitoring Suppl (ONE TOUCH ULTRA MINI) W/DEVICE KIT See admin instructions. 07/06/15   [provider]  busPIRone (BUSPAR) 10 MG tablet Take 1 tablet (10 mg total) by mouth 3 (three) times daily. 08/15/15   Rainey Pines, MD  diazepam (VALIUM) 5 MG tablet Take 1 tablet by mouth at bedtime as needed. h 01/28/17   [provider]  fluticasone (FLONASE) 50 MCG/ACT nasal spray Place 1 spray into both nostrils daily as needed. 01/06/15   [provider]  gabapentin  (NEURONTIN) 300 MG capsule TAKE ONE CAPSULE BY MOUTH AT BEDTIME FOR 7 DAYS THEN INCREASE TO 1 CAP EVERY 12 HOURS 05/29/15   [provider]  glucose blood test strip 1 each by Other route as needed for other. Use as instructed    [provider]  ibuprofen (ADVIL,MOTRIN) 200 MG tablet Take 800 mg by mouth every 6 (six) hours as needed.     [provider]  levothyroxine (SYNTHROID, LEVOTHROID) 150 MCG tablet Take 1 tablet by mouth daily. 12/02/16   [provider]  LINZESS 72 MCG capsule Take 1 capsule by mouth daily. 12/24/16   [provider]  lisinopril-hydrochlorothiazide (PRINZIDE,ZESTORETIC) 20-25 MG per tablet Take 1 tablet by mouth daily. 01/14/15   [provider]  Melatonin 5 MG CAPS Take 1 capsule by mouth at bedtime as needed (sleep).    [provider]  meloxicam (MOBIC) 7.5 MG tablet Take 1 tablet by mouth daily as needed. 12/24/16   [provider]  montelukast (SINGULAIR) 10 MG tablet TAKE 1 TABLET BY MOUTH DAILY IN AM 06/14/15   [provider]  morphine 5 MG suppository Place 5 mg rectally every 3 (three) hours as needed for pain.    [provider]  Jonetta Speak LANCETS 20F [place] TEST EVERY DAY 07/06/15   [provider]  oxyCODONE (ROXICODONE) 5 MG/5ML solution Take by mouth.    [provider]  oxyCODONE-acetaminophen (PERCOCET/ROXICET) 5-325 MG tablet Take 1 tablet by mouth every 12 (twelve) hours. 03/28/15   [provider]  pantoprazole (PROTONIX) 40 MG tablet Take 40 mg by mouth daily. 11/29/14   [provider]  PAZEO 0.7 % SOLN Place 1 drop into both eyes 2 (two) times daily as needed. 01/10/15   [provider]  potassium chloride SA (K-DUR,KLOR-CON) 20 MEQ tablet Take 20 mEq by mouth daily.     [provider]  ranitidine (ZANTAC) 300 MG tablet Take 1 tablet by mouth daily. 12/23/16   [provider]  RESTASIS 0.05 % ophthalmic  emulsion Place 1 drop into both eyes 2 (two) times daily. 01/02/15   [provider]  sertraline (ZOLOFT) 50 MG tablet Take 50 mg by mouth daily.    [provider]  Silica (SILICON DIOXIDE) POWD Take by mouth.    [provider]  tiZANidine (ZANAFLEX) 4 MG tablet  10/26/09   [provider]  traMADol (ULTRAM) 50 MG tablet Take 1 tablet by mouth every 6 (six) hours as needed.    [provider]      PHYSICAL EXAMINATION:   VITAL SIGNS: Blood pressure (!) 157/84, pulse 79, temperature 98.4 F (36.9 C), temperature source Oral, resp. rate 16, height _0  (1.422 m), weight 80.3 kg (177 lb), SpO2 99 %.  GENERAL:  56 y.o.-year-old patient lying in the bed with no acute distress.  EYES: Pupils equal, round, reactive to light and accommodation. No scleral icterus. Extraocular muscles  intact.  HEENT: Head atraumatic, normocephalic. Oropharynx and nasopharynx clear.  NECK:  Supple, no jugular venous distention. No thyroid enlargement, no tenderness.  LUNGS: Normal breath sounds bilaterally, no wheezing, rales,rhonchi or crepitation. No use of accessory muscles of respiration.  CARDIOVASCULAR: S1, S2 normal. No murmurs, rubs, or gallops.  ABDOMEN: Soft, nontender, nondistended. Bowel sounds present. No organomegaly or mass.  EXTREMITIES: No pedal edema, cyanosis, or clubbing.  NEUROLOGIC: Cranial nerves II through XII are intact. Muscle strength 3-4/5 in lower extremities, 5/5 in upper extremities. Sensation intact. Gait not checked.  PSYCHIATRIC: The patient is alert and oriented x 3.  SKIN: No obvious rash, lesion, or ulcer.   LABORATORY PANEL:   CBC  Recent Labs Lab 02/12/17 1230  WBC 10.2  HGB 14.8  HCT 44.1  PLT 219  MCV 86.6  MCH 29.0  MCHC 33.5  RDW 13.4   ------------------------------------------------------------------------------------------------------------------  Chemistries   Recent Labs Lab 02/12/17 1230  NA 138  K 3.2*   CL 102  CO2 26  GLUCOSE 128*  BUN 5*  CREATININE 0.50  CALCIUM 9.1   ------------------------------------------------------------------------------------------------------------------ estimated creatinine clearance is 66.8 mL/min (by C-G formula based on SCr of 0.5 mg/dL). ------------------------------------------------------------------------------------------------------------------ No results for input(s): TSH, T4TOTAL, T3FREE, THYROIDAB in the last 72 hours.  Invalid input(s): FREET3   Coagulation profile No results for input(s): INR, PROTIME in the last 168 hours. ------------------------------------------------------------------------------------------------------------------- No results for input(s): DDIMER in the last 72 hours. -------------------------------------------------------------------------------------------------------------------  Cardiac Enzymes  Recent Labs Lab 02/12/17 1230  TROPONINI <0.03   ------------------------------------------------------------------------------------------------------------------ Invalid input(s): POCBNP  ---------------------------------------------------------------------------------------------------------------  Urinalysis No results found for: COLORURINE, APPEARANCEUR, LABSPEC, PHURINE, GLUCOSEU, HGBUR, BILIRUBINUR, KETONESUR, PROTEINUR, UROBILINOGEN, NITRITE, LEUKOCYTESUR   RADIOLOGY: Dg Chest 2 View  Result Date: 02/12/2017 CLINICAL DATA:  Chest pain EXAM: CHEST  2 VIEW COMPARISON:  None. FINDINGS: Cardiac enlargement without heart failure. Lungs are clear without infiltrate effusion or mass. Surgical clips in the thyroid bed bilaterally. IMPRESSION: No active cardiopulmonary disease. Electronically Signed   By: Franchot Gallo M.D.   On: 02/12/2017 13:15   Ct Angio Chest Pe W Or Wo Contrast  Result Date: 02/12/2017 CLINICAL DATA:  Chest pain radiating to back x2 weeks EXAM: CT ANGIOGRAPHY CHEST WITH CONTRAST  TECHNIQUE: Multidetector CT imaging of the chest was performed using the standard protocol during bolus administration of intravenous contrast. Multiplanar CT image reconstructions and MIPs were obtained to evaluate the vascular anatomy. CONTRAST:  75 mL Isovue 370 IV COMPARISON:  Chest radiographs dated 02/12/2017 FINDINGS: Cardiovascular: Satisfactory opacification of the bilateral pulmonary arteries to the segmental level. Emboli in the distal right main pulmonary artery (series 5/ image 98) extending into the right upper, middle, and lower lobe pulmonary arteries. Emboli at the bifurcation of the left upper and lower lobe pulmonary artery is (series 5/ image 91), extending primarily into branches of the left lower lobe pulmonary artery. Overall clot burden is moderate to large. RV to LV ratio equals 0.91, without evidence of right heart strain. Heart is top-normal in size.  No pericardial effusion. No evidence of thoracic aortic aneurysm. Mediastinum/Nodes: No suspicious mediastinal lymphadenopathy. Status post thyroidectomy. Lungs/Pleura: Patchy/ground-glass wedge-shaped opacity anteriorly in the inferior right middle lobe (series 6/image 83), likely reflecting developing pulmonary infarct. Mild dependent atelectasis in the bilateral lower lobes. Linear atelectasis in the lingula. No focal consolidation. No suspicious pulmonary nodules. No pleural effusion or pneumothorax. Upper Abdomen: Visualized upper abdomen is notable for hepatic steatosis with focal fatty sparing along the gallbladder fossa. Musculoskeletal: Visualized osseous  structures are within normal limits. Review of the MIP images confirms the above findings. IMPRESSION: Bilateral pulmonary emboli in the distal main pulmonary arteries and extending into all lobes. Overall clot burden is moderate to large. No evidence of right heart strain. Small developing pulmonary infarct inferiorly in the right middle lobe. Critical Value/emergent results were  called by telephone at the time of interpretation on 02/12/2017 at 5:36 pm to Dr. Nance Pear , who verbally acknowledged these results. Electronically Signed   By: Julian Hy M.D.   On: 02/12/2017 17:38    EKG: Orders placed or performed during the hospital encounter of 02/12/17  . EKG 12-Lead  . EKG 12-Lead  . ED EKG within 10 minutes  . ED EKG within 10 minutes    IMPRESSION AND PLAN:  * Bilateral pulmonary emboli with large clot burden and possible pulmonary infarct.   Heparin IV drip ordered by EMG shin. Continue for now.    May help to change to oral anticoagulant once patient is stable.   We will get echocardiogram to check right heart strain.   Bilateral leg Doppler studies ordered but not reported yet, to check for clot burden in her legs.   May need to get vascular consult if she has been clot burden in her legs also.   Advice to follow with hematology clinic as outpatient.  * Diabetes   Continue home medication and keep on sliding scale coverage.  * Fibromyalgia and chronic pain.   Continue medications.  * Hypothyroidism   Continue levothyroxine.  * Hypertension    Continue lisinopril and hydrochlorothiazide.  All the records are reviewed and case discussed with ED provider. Management plans discussed with the patient, family and they are in agreement.  CODE STATUS: Full code. Code Status History    This patient does not have a recorded code status. Please follow your organizational policy for patients in this situation.       TOTAL TIME TAKING CARE OF THIS PATIENT: 50 minutes.    Vaughan Basta M.D on 02/12/2017   Between 7am to 6pm - Pager - [phone]  After 6pm go to [email or web address] - password [email or web address] [facility]  Office  [phone]  CC: Primary care physician; Perrin Maltese, MD   Note: This dictation was prepared with Dragon dictation along with smaller phrase technology. Any transcriptional errors that  result from this process are unintentional.

## 2017-02-12 NOTE — ED Notes (Signed)
Upon going in to transfer pt, pt states, "I feel like I am running a fever."  Temperature checked.  Cat, 2A RN notified upon transfer to room.

## 2017-02-12 NOTE — ED Triage Notes (Signed)
Pt in via POV with complaints of intermittent centralized chest pain radiating through to back x 2 weeks.  Pt reports pain has become more intense over the last two weeks.  NAD noted at this time.

## 2017-02-12 NOTE — ED Notes (Signed)
Pt provided meal tray

## 2017-02-12 NOTE — ED Notes (Signed)
Hospitalist notified that pt wears Cpap at night due to Sleep Apnea.

## 2017-02-12 NOTE — ED Provider Notes (Signed)
[facility] Emergency Department Provider Note   ____________________________________________   I have reviewed the triage vital signs and the nursing notes.   HISTORY  Chief Complaint Chest Pain   History limited by: Not Limited   HPI Katie Baker is a 56 y.o. female who presents to the emergency department today because of concerns for chest pain and right leg swelling. Patient states the chest pain is located centrally. She does describe it as sharp. It is intermittent. She is notany pattern to it. She did have some right shoulder pain as well. This was worse with movement of the right arm. Patient states she has baseline shortness of breath but has no some increasing shortness breath associated with the chest pain. The patient additionally has been noticing some increasing right leg swelling. This has been accompanied by some tenderness. She denies any fevers.   Past Medical History:  Diagnosis Date  . Diabetes mellitus, type II ([place])   . Dislocation of hip, congenital   . Fibromyalgia   . Hyperlipidemia   . Hypertension   . Hypothyroidism   . Obesity   . Prediabetes   . Reflux   . Sleep apnea   . Spinal stenosis   . Tendonitis   . Thyroid cancer [facility])     Patient Active Problem List   Diagnosis Date Noted  . Gout 10/26/2015  . [facility] (congenital dislocation of the hip) 06/19/2015  . HLD (hyperlipidemia) 06/19/2015  . BP (high blood pressure) 06/19/2015  . Adiposity 06/19/2015  . Borderline diabetes mellitus 06/19/2015  . Obstructive apnea 06/19/2015  . H/O malignant neoplasm of thyroid 07/15/2014  . Hypothyroidism, postop 07/15/2014  . Degeneration of intervertebral disc of lumbar region 03/18/2013  . Fibromyalgia 03/18/2013  . LBP (low back pain) 03/18/2013    Past Surgical History:  Procedure Laterality Date  . COLONOSCOPY WITH PROPOFOL N/A 02/09/2015   Procedure: COLONOSCOPY WITH PROPOFOL;  Surgeon: [name], MD;   Location[facility] ENDOSCOPY;  Service: Endoscopy;  Laterality: N/A;  . ESOPHAGOGASTRODUODENOSCOPY N/A 02/09/2015   Procedure: ESOPHAGOGASTRODUODENOSCOPY (EGD);  Surgeon: [name], MD;  Location: [facility] ENDOSCOPY;  Service: Endoscopy;  Laterality: N/A;  . HIP SURGERY    . THYROID SURGERY      Prior to Admission medications   Medication Sig Start Date End Date Taking? Authorizing Provider  allopurinol (ZYLOPRIM) 100 MG tablet Take 100 mg by mouth daily. 06/01/15   [provider]  Blood Glucose Monitoring Suppl (ONE TOUCH ULTRA MINI) W/DEVICE KIT See admin instructions. 07/06/15   [provider]  busPIRone (BUSPAR) 10 MG tablet Take 1 tablet (10 mg total) by mouth 3 (three) times daily. 08/15/15   Rainey Pines, MD  CRESTOR 40 MG tablet Take 40 mg by mouth every evening. 01/14/15   [provider]  diazepam (VALIUM) 2 MG tablet Take 2 mg by mouth at bedtime. 05/18/15   [provider]  febuxostat (ULORIC) 40 MG tablet Take 40 mg by mouth daily.    [provider]  fluticasone (FLONASE) 50 MCG/ACT nasal spray Place 1 spray into both nostrils daily as needed. 01/06/15   [provider]  gabapentin (NEURONTIN) 300 MG capsule TAKE ONE CAPSULE BY MOUTH AT BEDTIME FOR 7 DAYS THEN INCREASE TO 1 CAP EVERY 12 HOURS 05/29/15   [provider]  glucose blood test strip 1 each by Other route as needed for other. Use as instructed    [provider]  ibuprofen (ADVIL,MOTRIN) 200 MG  tablet Take 800 mg by mouth every 6 (six) hours as needed.     [provider]  levothyroxine (SYNTHROID, LEVOTHROID) 137 MCG tablet Take 150 mcg by mouth daily. 1 tab for 6 days, Thursday take 1.5 tabs 11/05/14   [provider]  lisinopril-hydrochlorothiazide (PRINZIDE,ZESTORETIC) 20-25 MG per tablet Take 1 tablet by mouth daily. 01/14/15   [provider]  Melatonin 5 MG CAPS Take 1 capsule by mouth at bedtime as needed (sleep).     [provider]  montelukast (SINGULAIR) 10 MG tablet TAKE 1 TABLET BY MOUTH DAILY IN AM 06/14/15   [provider]  morphine 5 MG suppository Place 5 mg rectally every 3 (three) hours as needed for pain.    [provider]  Jonetta Speak LANCETS 50Y [place] TEST EVERY DAY 07/06/15   [provider]  oxyCODONE (ROXICODONE) 5 MG/5ML solution Take by mouth.    [provider]  oxyCODONE-acetaminophen (PERCOCET/ROXICET) 5-325 MG tablet Take 1 tablet by mouth every 12 (twelve) hours. 03/28/15   [provider]  pantoprazole (PROTONIX) 40 MG tablet Take 40 mg by mouth daily. 11/29/14   [provider]  PAZEO 0.7 % SOLN Place 1 drop into both eyes 2 (two) times daily as needed. 01/10/15   [provider]  potassium chloride SA (K-DUR,KLOR-CON) 20 MEQ tablet Take 20 mEq by mouth daily.     [provider]  RESTASIS 0.05 % ophthalmic emulsion Place 1 drop into both eyes 2 (two) times daily. 01/02/15   [provider]  sertraline (ZOLOFT) 50 MG tablet Take 50 mg by mouth daily.    [provider]  Silica (SILICON DIOXIDE) POWD Take by mouth.    [provider]  tiZANidine (ZANAFLEX) 4 MG tablet  10/26/09   [provider]    Allergies Patient has no known allergies.  Family History  Problem Relation Age of Onset  . Heart disease Mother   . Hypertension Mother   . Alzheimer's disease Mother   . Diabetes Mother   . Stroke Mother   . Anxiety disorder Mother   . Diabetes Father   . Alcohol abuse Father   . Bladder Cancer Sister   . Thyroid disease Brother   . Diabetes Daughter   . Diabetes Maternal Aunt     Social History Social History  Substance Use Topics  . Smoking status: Never Smoker  . Smokeless tobacco: Never Used  . Alcohol use No    Review of Systems Constitutional: No fever/chills Eyes: No visual changes. ENT: No sore throat. Cardiovascular: Positive chest  pain. Respiratory: Positive shortness of breath. Gastrointestinal: No abdominal pain.  No nausea, no vomiting.  No diarrhea.   Genitourinary: Negative for dysuria. Musculoskeletal: Negative for back pain. Skin: Negative for rash. Neurological: Negative for headaches, focal weakness or numbness.  ____________________________________________   PHYSICAL EXAM:  VITAL SIGNS: ED Triage Vitals  Enc Vitals Group     BP 02/12/17 1229 (!) 147/70     Pulse Rate 02/12/17 1229 81     Resp 02/12/17 1229 16     Temp 02/12/17 1229 98.4 F (36.9 C)     Temp Source 02/12/17 1229 Oral     SpO2 02/12/17 1229 96 %     Weight 02/12/17 1229 177 lb (80.3 kg)     Height 02/12/17 1229 4' 8"  (1.422 m)     Head Circumference --      Peak Flow --      Pain  Score 02/12/17 1242 10   Constitutional: Alert and oriented. Well appearing and in no distress. Eyes: Conjunctivae are normal.  ENT   Head: Normocephalic and atraumatic.   Nose: No congestion/rhinnorhea.   Mouth/Throat: Mucous membranes are moist.   Neck: No stridor. Hematological/Lymphatic/Immunilogical: No cervical lymphadenopathy. Cardiovascular: Normal rate, regular rhythm.  No murmurs, rubs, or gallops.  Respiratory: Normal respiratory effort without tachypnea nor retractions. Breath sounds are clear and equal bilaterally. No wheezes/rales/rhonchi. Gastrointestinal: Soft and non tender. No rebound. No guarding.  Genitourinary: Deferred Musculoskeletal: Normal range of motion in all extremities. No lower extremity edema. Neurologic:  Normal speech and language. No gross focal neurologic deficits are appreciated.  Skin:  Skin is warm, dry and intact. No rash noted. Psychiatric: Mood and affect are normal. Speech and behavior are normal. Patient exhibits appropriate insight and judgment.  ____________________________________________    LABS (pertinent positives/negatives)  Labs Reviewed  BASIC METABOLIC PANEL - Abnormal;  Notable for the following:       Result Value   Potassium 3.2 (*)    Glucose, Bld 128 (*)    BUN 5 (*)    All other components within normal limits  CBC  TROPONIN I     ____________________________________________   EKG  I, Nance Pear, attending physician, personally viewed and interpreted this EKG  EKG Time: 1224 Rate: 79 Rhythm: normal sinus rhythm Axis: left axis deviation Intervals: qtc 479 QRS: narrow, LVH ST changes: no st elevation Impression: abnormal ekg   ____________________________________________    RADIOLOGY  CXR IMPRESSION: No active cardiopulmonary disease.  CT angio IMPRESSION: Bilateral pulmonary emboli in the distal main pulmonary arteries and extending into all lobes. Overall clot burden is moderate to large. No evidence of right heart strain.  Small developing pulmonary infarct inferiorly in the right middle Lobe.  I, Nance Pear, personally discussed these images (CT scan) and results by phone with the on-call radiologist and used this discussion as part of my medical decision making.    ____________________________________________   PROCEDURES  Procedures  CRITICAL CARE Performed by: Nance Pear   Total critical care time: 35 minutes  Critical care time was exclusive of separately billable procedures and treating other patients.  Critical care was necessary to treat or prevent imminent or life-threatening deterioration.  Critical care was time spent personally by me on the following activities: development of treatment plan with patient and/or surrogate as well as nursing, discussions with consultants, evaluation of patient's response to treatment, examination of patient, obtaining history from patient or surrogate, ordering and performing treatments and interventions, ordering and review of laboratory studies, ordering and review of radiographic studies, pulse oximetry and re-evaluation of patient's  condition.  ____________________________________________   INITIAL IMPRESSION / ASSESSMENT AND PLAN / ED COURSE  Pertinent labs & imaging results that were available during my care of the patient were reviewed by me and considered in my medical decision making (see chart for details).  Patient presented to the emergency department today because of concerns for chest pain and right leg swelling. Chest x-ray and initial blood work without concerning findings. However patient did have d-dimer added to blood testing. This was significantly elevated. CT scan did show bilateral pulmonary embolisms. Because of this finding the patient was started on heparin. Patient will be admitted to the hospitalist service.  ____________________________________________   FINAL CLINICAL IMPRESSION(S) / ED DIAGNOSES  Final diagnoses:  Chest pain  Other pulmonary embolism without acute cor pulmonale, unspecified chronicity ([place])     Note: This  dictation was prepared with Dragon dictation. Any transcriptional errors that result from this process are unintentional     Nance Pear, MD 02/12/17 1749

## 2017-02-12 NOTE — Progress Notes (Signed)
ANTICOAGULATION CONSULT NOTE - Initial Consult  Pharmacy Consult for Heparin Drip  Indication: pulmonary embolus  No Known Allergies  Patient Measurements: Height: 4\' 8"  (142.2 cm) Weight: 177 lb (80.3 kg) IBW/kg (Calculated) : 36.3 Heparin Dosing Weight: 60kg  Vital Signs: Temp: 98.4 F (36.9 C) (06/13 1229) Temp Source: Oral (06/13 1229) BP: 157/84 (06/13 1456) Pulse Rate: 79 (06/13 1456)  Labs:  Recent Labs  02/12/17 1230  HGB 14.8  HCT 44.1  PLT 219  CREATININE 0.50  TROPONINI <0.03    Estimated Creatinine Clearance: 66.8 mL/min (by C-G formula based on SCr of 0.5 mg/dL).   Medical History: Past Medical History:  Diagnosis Date  . Diabetes mellitus, type II ([place])   . Dislocation of hip, congenital   . Fibromyalgia   . Hyperlipidemia   . Hypertension   . Hypothyroidism   . Obesity   . Prediabetes   . Reflux   . Sleep apnea   . Spinal stenosis   . Tendonitis   . Thyroid cancer [facility])     Assessment: 56 yo female with chest pain radiating to back and right posterior calf tenderness. Pharmacy consulted for heparin dosing for PE.    Goal of Therapy:  Heparin level 0.3-0.7 units/ml Monitor platelets by anticoagulation protocol: Yes   Plan:  Baseline labs ordered DW: 60kg Give 4000 units bolus x 1 Start heparin infusion at 1050 units/hr Check anti-Xa level in 6 hours and daily while on heparin Continue to monitor H&H and platelets  Pernell Dupre, PharmD, BCPS Clinical Pharmacist 02/12/2017 5:47 PM

## 2017-02-13 ENCOUNTER — Inpatient Hospital Stay: Payer: Medicare Other

## 2017-02-13 DIAGNOSIS — R0602 Shortness of breath: Secondary | ICD-10-CM

## 2017-02-13 DIAGNOSIS — I82409 Acute embolism and thrombosis of unspecified deep veins of unspecified lower extremity: Secondary | ICD-10-CM

## 2017-02-13 DIAGNOSIS — I2699 Other pulmonary embolism without acute cor pulmonale: Secondary | ICD-10-CM

## 2017-02-13 DIAGNOSIS — E119 Type 2 diabetes mellitus without complications: Secondary | ICD-10-CM

## 2017-02-13 LAB — BASIC METABOLIC PANEL
Anion gap: 7 (ref 5–15)
BUN: 5 mg/dL — ABNORMAL LOW (ref 6–20)
CHLORIDE: 105 mmol/L (ref 101–111)
CO2: 27 mmol/L (ref 22–32)
CREATININE: 0.57 mg/dL (ref 0.44–1.00)
Calcium: 8.4 mg/dL — ABNORMAL LOW (ref 8.9–10.3)
GFR calc non Af Amer: >60 mL/min (ref >60)
Glucose, Bld: 159 mg/dL — ABNORMAL HIGH (ref 65–99)
Potassium: 2.8 mmol/L — ABNORMAL LOW (ref 3.5–5.1)
Sodium: 139 mmol/L (ref 135–145)

## 2017-02-13 LAB — CBC
HCT: 38.6 % (ref 35.0–47.0)
HEMOGLOBIN: 13.2 g/dL (ref 12.0–16.0)
MCH: 29.5 pg (ref 26.0–34.0)
MCHC: 34.1 g/dL (ref 32.0–36.0)
MCV: 86.3 fL (ref 80.0–100.0)
PLATELETS: 197 10*3/uL (ref 150–440)
RBC: 4.48 MIL/uL (ref 3.80–5.20)
RDW: 13.2 % (ref 11.5–14.5)
WBC: 9.4 10*3/uL (ref 3.6–11.0)

## 2017-02-13 LAB — ECHOCARDIOGRAM COMPLETE
Height: 56 in
Weight: 2832 oz

## 2017-02-13 LAB — HEPARIN LEVEL (UNFRACTIONATED)
Heparin Unfractionated: 0.34 IU/mL (ref 0.30–0.70)
Heparin Unfractionated: 0.4 IU/mL (ref 0.30–0.70)

## 2017-02-13 LAB — GLUCOSE, CAPILLARY
GLUCOSE-CAPILLARY: 123 mg/dL — AB (ref 65–99)
GLUCOSE-CAPILLARY: 139 mg/dL — AB (ref 65–99)
Glucose-Capillary: 133 mg/dL — ABNORMAL HIGH (ref 65–99)
Glucose-Capillary: 134 mg/dL — ABNORMAL HIGH (ref 65–99)

## 2017-02-13 MED ORDER — SODIUM CHLORIDE 0.9 % IV SOLN
INTRAVENOUS | Status: DC
  Administered 2017-02-14 – 2017-02-16 (×5): via INTRAVENOUS

## 2017-02-13 MED ORDER — CEFAZOLIN SODIUM-DEXTROSE 2-4 GM/100ML-% IV SOLN
2.0000 g | INTRAVENOUS | Status: AC
Start: 2017-02-14 — End: 2017-02-14
  Administered 2017-02-14: 2 g via INTRAVENOUS
  Filled 2017-02-13: qty 100

## 2017-02-13 MED ORDER — POTASSIUM CHLORIDE CRYS ER 20 MEQ PO TBCR
40.0000 meq | EXTENDED_RELEASE_TABLET | ORAL | Status: AC
  Administered 2017-02-13 (×2): 40 meq via ORAL
  Filled 2017-02-13 (×2): qty 2

## 2017-02-13 MED ORDER — LEVOTHYROXINE SODIUM 75 MCG PO TABS
150.0000 ug | ORAL_TABLET | Freq: Every day | ORAL | Status: DC
  Administered 2017-02-13 – 2017-02-18 (×6): 150 ug via ORAL
  Filled 2017-02-13 (×7): qty 2

## 2017-02-13 NOTE — Progress Notes (Signed)
Pt arrived from ED alert and oriented. C/o pain 10/10  Generalized r/t fibromyalgia. No SOB, VSS. Telemetry and skin verified. CPAP at HS. No concerns offered at this time.

## 2017-02-13 NOTE — Progress Notes (Addendum)
ANTICOAGULATION CONSULT NOTE - Initial Consult  Pharmacy Consult for Heparin Drip  Indication: pulmonary embolus  No Known Allergies  Patient Measurements: Height: 4\' 8"  (142.2 cm) Weight: 179 lb 14.4 oz (81.6 kg) IBW/kg (Calculated) : 36.3 Heparin Dosing Weight: 60kg  Vital Signs: Temp: 98.3 F (36.8 C) (06/14 0549) Temp Source: Oral (06/14 0549) BP: 124/52 (06/14 0549) Pulse Rate: 65 (06/14 0549)  Labs:  Recent Labs  02/12/17 1230 02/12/17 1548 02/13/17 0042 02/13/17 0710  HGB 14.8  --  13.2  --   HCT 44.1  --  38.6  --   PLT 219  --  197  --   APTT  --  24  --   --   LABPROT  --  12.8  --   --   INR  --  0.96  --   --   HEPARINUNFRC  --   --  0.34 0.40  CREATININE 0.50  --  0.57  --   TROPONINI <0.03  --   --   --     Estimated Creatinine Clearance: 67.4 mL/min (by C-G formula based on SCr of 0.57 mg/dL).   Medical History: Past Medical History:  Diagnosis Date  . Diabetes mellitus, type II ([place])   . Dislocation of hip, congenital   . Fibromyalgia   . Hyperlipidemia   . Hypertension   . Hypothyroidism   . Obesity   . Prediabetes   . Reflux   . Sleep apnea   . Spinal stenosis   . Tendonitis   . Thyroid cancer [facility])     Assessment: 56 yo female with chest pain radiating to back and right posterior calf tenderness. Pharmacy consulted for heparin dosing for PE.    Goal of Therapy:  Heparin level 0.3-0.7 units/ml Monitor platelets by anticoagulation protocol: Yes   Plan:  Baseline labs ordered DW: 60kg Give 4000 units bolus x 1 Start heparin infusion at 1050 units/hr Check anti-Xa level in 6 hours and daily while on heparin Continue to monitor H&H and platelets   6/14 00:30 heparin level 0.34. Continue current regimen. Recheck in 6 hours to confirm.  6/14 0710 HL therapeutic x 2. Continue current rate. Will recheck HL and CBC daily.  Laural Benes, PharmD, BCPS Clinical Pharmacist 02/13/2017 8:43 AM

## 2017-02-13 NOTE — Progress Notes (Signed)
Patient off unit for testing. Will resume care upon return. Katie Baker M Obert Espindola  

## 2017-02-13 NOTE — Progress Notes (Signed)
[place] made a followup visit with this Pt that [place] had visited in the morning but pt was not in the Rm. [place] met with Pt at 1:17pm. Pt stated she had blood clots in her lungs and that this morning another blood clot was found in her leg. Pt was having lunch at the time of this visit. Pt spoke about her health struggles that she said have been going on for many years. Pt talked about her 2 daughters and the grandchild that she has been blessed with. Pt asked for prayers for her health and for her family, which Ch provided with a ministry of presence.     02/13/17 1300  Clinical Encounter Type  Visited With Patient  Visit Type Follow-up;Spiritual support  Referral From Chaplain  Consult/Referral To Chaplain  Spiritual Encounters  Spiritual Needs Prayer;Other (Comment)

## 2017-02-13 NOTE — Progress Notes (Signed)
ANTICOAGULATION CONSULT NOTE - Initial Consult  Pharmacy Consult for Heparin Drip  Indication: pulmonary embolus  No Known Allergies  Patient Measurements: Height: 4\' 8"  (142.2 cm) Weight: 179 lb 14.4 oz (81.6 kg) IBW/kg (Calculated) : 36.3 Heparin Dosing Weight: 60kg  Vital Signs: Temp: 99.2 F (37.3 C) (06/13 2130) Temp Source: Oral (06/13 2130) BP: 168/72 (06/13 2130) Pulse Rate: 103 (06/13 2130)  Labs:  Recent Labs  02/12/17 1230 02/12/17 1548 02/13/17 0042  HGB 14.8  --  13.2  HCT 44.1  --  38.6  PLT 219  --  197  APTT  --  24  --   LABPROT  --  12.8  --   INR  --  0.96  --   HEPARINUNFRC  --   --  0.34  CREATININE 0.50  --  0.57  TROPONINI <0.03  --   --     Estimated Creatinine Clearance: 67.4 mL/min (by C-G formula based on SCr of 0.57 mg/dL).   Medical History: Past Medical History:  Diagnosis Date  . Diabetes mellitus, type II ([place])   . Dislocation of hip, congenital   . Fibromyalgia   . Hyperlipidemia   . Hypertension   . Hypothyroidism   . Obesity   . Prediabetes   . Reflux   . Sleep apnea   . Spinal stenosis   . Tendonitis   . Thyroid cancer [facility])     Assessment: 56 yo female with chest pain radiating to back and right posterior calf tenderness. Pharmacy consulted for heparin dosing for PE.    Goal of Therapy:  Heparin level 0.3-0.7 units/ml Monitor platelets by anticoagulation protocol: Yes   Plan:  Baseline labs ordered DW: 60kg Give 4000 units bolus x 1 Start heparin infusion at 1050 units/hr Check anti-Xa level in 6 hours and daily while on heparin Continue to monitor H&H and platelets   6/14 00:30 heparin level 0.34. Continue current regimen. Recheck in 6 hours to confirm.  Eloise Harman, PharmD, BCPS Clinical Pharmacist 02/13/2017 2:02 AM

## 2017-02-13 NOTE — Progress Notes (Signed)
[place] responded to a OR for prayer for this Pt. [place] visited the pt but pt was not in Rm as she was taken out of Rm to do ultrasound. [place] talked to pt's family member who was in the Rm at the time and will do follow up with the pt sometime this morning.    02/13/17 1000  Clinical Encounter Type  Visited With Patient;Family  Visit Type Initial;Spiritual support;Other (Comment)  Referral From Nurse  Consult/Referral To Chaplain  Spiritual Encounters  Spiritual Needs Prayer

## 2017-02-13 NOTE — Consult Note (Signed)
Katie Baker  MRN : [id number]  Katie Baker is a 56 y.o. (1960-10-13) female who presents with chief complaint of  Chief Complaint  Patient presents with  . Chest Pain   History of Present Illness:  The patient is a 56 year old female with a known history of diabetes, fibromyalgia, hyperlipidemia, hypertension, obesity, spinal stenosis, thyroid cancer who presents to the ED today with a 2 week history of worsening shortness of breath. The patient has had multiple hip surgeries including replacements and is not able to walk study. Because of this she is also not very mobile. Upon workup in the ED the patient was found to have bilateral pulmonary emboli in the distal main pulmonary arteries and extending into all lobes. Overall clot burden is moderate to large. There is a small developing pulmonary infarct inferiorly in the right middle lobe. Patient also found to have deep vein thrombosis noted in the right common femoral and superficial femoral veins.  Patient with continued shortness of breath and chest pain this evening. Patient states she has had swelling in her lower extremity for weeks. She denies any history of DVT or PE. Denies any trauma to her lower extremity. Patient has undergone multiple bilateral hip surgeries. Patient denies any fever, nausea or vomiting.  Consult by Dr. Anselm Jungling for further recommendations.   Current Facility-Administered Medications  Medication Dose Route Frequency Provider Last Rate Last Dose  . cycloSPORINE (RESTASIS) 0.05 % ophthalmic emulsion 1 drop  1 drop Both Eyes BID Vaughan Basta, MD   1 drop at 02/12/17 2211  . diazepam (VALIUM) tablet 5 mg  5 mg Oral Q12H PRN Vaughan Basta, MD   5 mg at 02/12/17 2211  . dicyclomine (BENTYL) capsule 10 mg  10 mg Oral TID AC & HS Vaughan Basta, MD   10 mg at 02/13/17 1703  . diphenhydrAMINE (BENADRYL) capsule 25 mg  25 mg Oral QHS PRN Lance Coon, MD      . docusate sodium (COLACE) capsule 100 mg  100 mg Oral BID PRN Vaughan Basta, MD      . famotidine (PEPCID) tablet 20 mg  20 mg Oral BID Vaughan Basta, MD   20 mg at 02/12/17 2211  . fluticasone (FLONASE) 50 MCG/ACT nasal spray 1 spray  1 spray Each Nare Daily PRN Vaughan Basta, MD      . gabapentin (NEURONTIN) capsule 300 mg  300 mg Oral BID Vaughan Basta, MD   300 mg at 02/12/17 2211  . heparin ADULT infusion 100 units/mL (25000 units/275mL sodium chloride 0.45%)  1,050 Units/hr Intravenous Continuous Hallaji, Sheema M, RPH 10.5 mL/hr at 02/13/17 1702 1,050 Units/hr at 02/13/17 1702  . insulin aspart (novoLOG) injection 0-9 Units  0-9 Units Subcutaneous TID WC Vaughan Basta, MD   1 Units at 02/13/17 1703  . levothyroxine (SYNTHROID, LEVOTHROID) tablet 150 mcg  150 mcg Oral QAC breakfast Vaughan Basta, MD   150 mcg at 02/13/17 0644  . linaclotide (LINZESS) capsule 72 mcg  72 mcg Oral QAC breakfast Vaughan Basta, MD      . linagliptin (TRADJENTA) tablet 5 mg  5 mg Oral Daily Vaughan Basta, MD   5 mg at 02/13/17 0948  . morphine (MS CONTIN) 12 hr tablet 15 mg  15 mg Oral Q12H Vaughan Basta, MD   15 mg at 02/13/17 0948  . pantoprazole (PROTONIX) EC tablet 40 mg  40 mg Oral Daily Vaughan Basta, MD   40 mg at 02/13/17 [phone]  Past Medical History:  Diagnosis Date  . Diabetes mellitus, type II ([place])   . Dislocation of hip, congenital   . Fibromyalgia   . Hyperlipidemia   . Hypertension   . Hypothyroidism   . Obesity   . Prediabetes   . Reflux   . Sleep apnea   . Spinal stenosis   . Tendonitis   . Thyroid cancer [facility])    Past Surgical History:  Procedure Laterality Date  . COLONOSCOPY WITH PROPOFOL N/A 02/09/2015   Procedure: COLONOSCOPY WITH PROPOFOL;  Surgeon: [name], MD;  Location: [facility] ENDOSCOPY;  Service: Endoscopy;  Laterality: N/A;  . ESOPHAGOGASTRODUODENOSCOPY N/A  02/09/2015   Procedure: ESOPHAGOGASTRODUODENOSCOPY (EGD);  Surgeon: [name], MD;  Location: [facility] ENDOSCOPY;  Service: Endoscopy;  Laterality: N/A;  . HIP SURGERY    . THYROID SURGERY     Social History Social History  Substance Use Topics  . Smoking status: Never Smoker  . Smokeless tobacco: Never Used  . Alcohol use No   Family History Family History  Problem Relation Age of Onset  . Heart disease Mother   . Hypertension Mother   . Alzheimer's disease Mother   . Diabetes Mother   . Stroke Mother   . Anxiety disorder Mother   . Diabetes Father   . Alcohol abuse Father   . Bladder Cancer Sister   . Thyroid disease Brother   . Diabetes Daughter   . Diabetes Maternal Aunt   Denies family history of peripheral artery disease, venous disease or bleeding/clotting disorder  No Known Allergies  REVIEW OF SYSTEMS (Negative unless checked)  Constitutional: [] Weight loss  [] Fever  [] Chills Cardiac: [] Chest pain   [x] Chest pressure   [x] Palpitations   [] Shortness of breath when laying flat   [x] Shortness of breath at rest   [x] Shortness of breath with exertion. Vascular:  [x] Pain in legs with walking   [x] Pain in legs at rest   [] Pain in legs when laying flat   [] Claudication   [] Pain in feet when walking  [] Pain in feet at rest  [] Pain in feet when laying flat   [] History of DVT   [] Phlebitis   [] Swelling in legs   [] Varicose veins   [] Non-healing ulcers Pulmonary:   [] Uses home oxygen   [] Productive cough   [] Hemoptysis   [] Wheeze  [] COPD   [] Asthma Neurologic:  [] Dizziness  [] Blackouts   [] Seizures   [] History of stroke   [] History of TIA  [] Aphasia   [] Temporary blindness   [] Dysphagia   [] Weakness or numbness in arms   [] Weakness or numbness in legs Musculoskeletal:  [] Arthritis   [] Joint swelling   [] Joint pain   [] Low back pain Hematologic:  [] Easy bruising  [] Easy bleeding   [] Hypercoagulable state   [] Anemic  [] Hepatitis Gastrointestinal:  [] Blood in stool   [] Vomiting  blood  [] Gastroesophageal reflux/heartburn   [] Difficulty swallowing. Genitourinary:  [] Chronic kidney disease   [] Difficult urination  [] Frequent urination  [] Burning with urination   [] Blood in urine Skin:  [] Rashes   [] Ulcers   [] Wounds Psychological:  [] History of anxiety   []  History of major depression.  Physical Examination  Vitals:   02/12/17 2051 02/12/17 2130 02/13/17 0549 02/13/17 1207  BP:  (!) 168/72 (!) 124/52 (!) 126/44  Pulse:  (!) 103 65 83  Resp:  16 16 17   Temp: 99.8 F (37.7 C) 99.2 F (37.3 C) 98.3 F (36.8 C) 98.9 F (37.2 C)  TempSrc: Oral Oral Oral Oral  SpO2:  99%  97% 92%  Weight:  179 lb 14.4 oz (81.6 kg)    Height:       Body mass index is 40.33 kg/m. Gen:  WD/WN, NAD Head: [place]/AT, No temporalis wasting. Prominent temp pulse not noted. Ear/Nose/Throat: Hearing grossly intact, nares w/o erythema or drainage, oropharynx w/o Erythema/Exudate Eyes: Sclera non-icteric, conjunctiva clear Neck: Trachea midline.  No JVD.  Pulmonary:  Good air movement, respirations not labored, equal bilaterally.  Cardiac: RRR, normal S1, S2. Vascular:  Vessel Right Left  Radial Palpable Palpable  Ulnar Palpable Palpable  Brachial Palpable Palpable  Carotid Palpable, without bruit Palpable, without bruit  Aorta Not palpable N/A  Femoral Palpable Palpable  Popliteal    PT    DP     Unable to palpate pedal pulses, however bilateral lower extremity is warm  Gastrointestinal: soft, non-tender/non-distended. No guarding/reflex.  Musculoskeletal: M/S 5/5 throughout.  Extremities without ischemic changes.   Neurologic: Sensation grossly intact in extremities.  Symmetrical.  Speech is fluent. Motor exam as listed above. Psychiatric: Judgment intact, Mood & affect appropriate for pt's clinical situation. Dermatologic: No rashes or ulcers noted.  No cellulitis or open wounds. Lymph : No Cervical, Axillary, or Inguinal lymphadenopathy.  CBC Lab Results  Component Value  Date   WBC 9.4 02/13/2017   HGB 13.2 02/13/2017   HCT 38.6 02/13/2017   MCV 86.3 02/13/2017   PLT 197 02/13/2017   BMET    Component Value Date/Time   NA 139 02/13/2017 0042   NA 141 10/01/2011 0721   K 2.8 (L) 02/13/2017 0042   K 3.6 10/01/2011 0721   CL 105 02/13/2017 0042   CL 104 10/01/2011 0721   CO2 27 02/13/2017 0042   CO2 28 10/01/2011 0721   GLUCOSE 159 (H) 02/13/2017 0042   GLUCOSE 111 (H) 10/01/2011 0721   BUN 5 (L) 02/13/2017 0042   BUN 10 10/01/2011 0721   CREATININE 0.57 02/13/2017 0042   CREATININE 0.51 (L) 10/01/2011 0721   CALCIUM 8.4 (L) 02/13/2017 0042   CALCIUM 9.1 10/01/2011 0721   GFRNONAA >60 02/13/2017 0042   GFRNONAA >60 10/01/2011 0721   GFRAA >60 02/13/2017 0042   GFRAA >60 10/01/2011 0721   Estimated Creatinine Clearance: 67.4 mL/min (by C-G formula based on SCr of 0.57 mg/dL).  COAG Lab Results  Component Value Date   INR 0.96 02/12/2017   Radiology Dg Chest 2 View  Result Date: 02/12/2017 CLINICAL DATA:  Chest pain EXAM: CHEST  2 VIEW COMPARISON:  None. FINDINGS: Cardiac enlargement without heart failure. Lungs are clear without infiltrate effusion or mass. Surgical clips in the thyroid bed bilaterally. IMPRESSION: No active cardiopulmonary disease. Electronically Signed   By: Franchot Gallo M.D.   On: 02/12/2017 13:15   Ct Angio Chest Pe W Or Wo Contrast  Result Date: 02/12/2017 CLINICAL DATA:  Chest pain radiating to back x2 weeks EXAM: CT ANGIOGRAPHY CHEST WITH CONTRAST TECHNIQUE: Multidetector CT imaging of the chest was performed using the standard protocol during bolus administration of intravenous contrast. Multiplanar CT image reconstructions and MIPs were obtained to evaluate the vascular anatomy. CONTRAST:  75 mL Isovue 370 IV COMPARISON:  Chest radiographs dated 02/12/2017 FINDINGS: Cardiovascular: Satisfactory opacification of the bilateral pulmonary arteries to the segmental level. Emboli in the distal right main pulmonary artery  (series 5/ image 98) extending into the right upper, middle, and lower lobe pulmonary arteries. Emboli at the bifurcation of the left upper and lower lobe pulmonary artery is (series 5/ image 91), extending primarily  into branches of the left lower lobe pulmonary artery. Overall clot burden is moderate to large. RV to LV ratio equals 0.91, without evidence of right heart strain. Heart is top-normal in size.  No pericardial effusion. No evidence of thoracic aortic aneurysm. Mediastinum/Nodes: No suspicious mediastinal lymphadenopathy. Status post thyroidectomy. Lungs/Pleura: Patchy/ground-glass wedge-shaped opacity anteriorly in the inferior right middle lobe (series 6/image 83), likely reflecting developing pulmonary infarct. Mild dependent atelectasis in the bilateral lower lobes. Linear atelectasis in the lingula. No focal consolidation. No suspicious pulmonary nodules. No pleural effusion or pneumothorax. Upper Abdomen: Visualized upper abdomen is notable for hepatic steatosis with focal fatty sparing along the gallbladder fossa. Musculoskeletal: Visualized osseous structures are within normal limits. Review of the MIP images confirms the above findings. IMPRESSION: Bilateral pulmonary emboli in the distal main pulmonary arteries and extending into all lobes. Overall clot burden is moderate to large. No evidence of right heart strain. Small developing pulmonary infarct inferiorly in the right middle lobe. Critical Value/emergent results were called by telephone at the time of interpretation on 02/12/2017 at 5:36 pm to Dr. Nance Pear , who verbally acknowledged these results. Electronically Signed   By: Julian Hy M.D.   On: 02/12/2017 17:38   US Venous Img Lower Bilateral  Result Date: 02/13/2017 CLINICAL DATA:  Right leg pain for 1 week. EXAM: BILATERAL LOWER EXTREMITY VENOUS DOPPLER ULTRASOUND TECHNIQUE: Gray-scale sonography with graded compression, as well as color Doppler and duplex ultrasound  were performed to evaluate the lower extremity deep venous systems from the level of the common femoral vein and including the common femoral, femoral, profunda femoral, popliteal and calf veins including the posterior tibial, peroneal and gastrocnemius veins when visible. The superficial great saphenous vein was also interrogated. Spectral Doppler was utilized to evaluate flow at rest and with distal augmentation maneuvers in the common femoral, femoral and popliteal veins. COMPARISON:  None. FINDINGS: RIGHT LOWER EXTREMITY Common Femoral Vein: Noncompressible with some flow identified on Doppler consistent with nonocclusive thrombus. Saphenofemoral Junction: No evidence of thrombus. Normal compressibility and flow on color Doppler imaging. Profunda Femoral Vein: No evidence of thrombus. Normal compressibility and flow on color Doppler imaging. Femoral Vein: Noncompressible with flow noted on Doppler consistent with nonocclusive thrombus. Popliteal Vein: No evidence of thrombus. Normal compressibility, respiratory phasicity and response to augmentation. Calf Veins: No evidence of thrombus. Normal compressibility and flow on color Doppler imaging. Venous Reflux:  None. Other Findings:  None. LEFT LOWER EXTREMITY Common Femoral Vein: No evidence of thrombus. Normal compressibility, respiratory phasicity and response to augmentation. Saphenofemoral Junction: No evidence of thrombus. Normal compressibility and flow on color Doppler imaging. Profunda Femoral Vein: No evidence of thrombus. Normal compressibility and flow on color Doppler imaging. Femoral Vein: No evidence of thrombus. Normal compressibility, respiratory phasicity and response to augmentation. Popliteal Vein: No evidence of thrombus. Normal compressibility, respiratory phasicity and response to augmentation. Calf Veins: No evidence of thrombus. Normal compressibility and flow on color Doppler imaging. Superficial Great Saphenous Vein: No evidence of  thrombus. Normal compressibility and flow on color Doppler imaging. Venous Reflux:  None. Other Findings:  None. IMPRESSION: Deep venous thrombosis is noted within the right common femoral and superficial femoral veins. No evidence of deep venous thrombosis is noted in the left lower extremity. Electronically Signed   By: Marijo Conception, M.D.   On: 02/13/2017 09:57   Assessment/Plan 56 year old female with a known history of diabetes, fibromyalgia, hyperlipidemia, hypertension, obesity, spinal stenosis, thyroid cancer who presents to the ED  with a 2 week history of worsening shortness of breath on workup found to have DVT with PE. 1. PE: Patient with bilateral pulmonary emboli in the distal main pulmonary arteries and extending into all lobes. Overall clot burden is moderate to large. No evidence of right heart strain. Small developing pulmonary infarct inferiorly in the right middle lobe. Agree with heparin. Patient with a permanent medical condition effecting mobility.  Patient with continued shortness of breath this evening despite being on heparin. At this point, would consider placing an IVC filter. Risks, benefits procedure explained to patient - all questions answered. The patient wishes to proceed. Will plan on tomorrow afternoon.  2. DVT: Deep venous thrombosis is noted within the right common femoral and superficial femoral veins. No evidence of deep venous thrombosis is noted in the left lower extremity. Agree with anticoagulation with heparin. Switch to PO Eliquis 5mg  one tab by mouth BID. Patient to follow up in our office as an outpatient.  3. DM: Encouraged good control as its slows the progression of atherosclerotic disease   Discussed with Dr. Francene Castle, PA-C  02/13/2017 7:17 PM

## 2017-02-13 NOTE — Progress Notes (Signed)
[place] [facility] [place] NAME: Katie Baker    MR#:  [id number]  DATE OF BIRTH:  Oct 27, 1960  SUBJECTIVE:  CHIEF COMPLAINT:   Chief Complaint  Patient presents with  . Chest Pain   Cont to have chest tightness. No hypoxia.  large B/l PE and also have RLE dvt.  REVIEW OF SYSTEMS:  CONSTITUTIONAL: No fever, fatigue or weakness.  EYES: No blurred or double vision.  EARS, NOSE, AND THROAT: No tinnitus or ear pain.  RESPIRATORY: No cough, shortness of breath, wheezing or hemoptysis.  CARDIOVASCULAR: No chest pain, orthopnea, edema.  GASTROINTESTINAL: No nausea, vomiting, diarrhea or abdominal pain.  GENITOURINARY: No dysuria, hematuria.  ENDOCRINE: No polyuria, nocturia,  HEMATOLOGY: No anemia, easy bruising or bleeding SKIN: No rash or lesion. MUSCULOSKELETAL: No joint pain or arthritis.   NEUROLOGIC: No tingling, numbness, weakness.  PSYCHIATRY: No anxiety or depression.   ROS  DRUG ALLERGIES:  No Known Allergies  VITALS:  Blood pressure (!) 126/44, pulse 83, temperature 98.9 F (37.2 C), temperature source Oral, resp. rate (!) 21, height 4\' 8"  (1.422 m), weight 81.6 kg (179 lb 14.4 oz), SpO2 92 %.  PHYSICAL EXAMINATION:   GENERAL:  56 y.o.-year-old patient lying in the bed with no acute distress.  EYES: Pupils equal, round, reactive to light and accommodation. No scleral icterus. Extraocular muscles intact.  HEENT: Head atraumatic, normocephalic. Oropharynx and nasopharynx clear.  NECK:  Supple, no jugular venous distention. No thyroid enlargement, no tenderness.  LUNGS: Normal breath sounds bilaterally, no wheezing, rales,rhonchi or crepitation. No use of accessory muscles of respiration.  CARDIOVASCULAR: S1, S2 normal. No murmurs, rubs, or gallops.  ABDOMEN: Soft, nontender, nondistended. Bowel sounds present. No organomegaly or mass.  EXTREMITIES: No pedal edema, cyanosis, or clubbing.  NEUROLOGIC: Cranial nerves II through XII are  intact. Muscle strength 3-4/5 in lower extremities, 5/5 in upper extremities. Sensation intact. Gait not checked.  PSYCHIATRIC: The patient is alert and oriented x 3.  SKIN: No obvious rash, lesion, or ulcer.   Physical Exam LABORATORY PANEL:   CBC  Recent Labs Lab 02/13/17 0042  WBC 9.4  HGB 13.2  HCT 38.6  PLT 197   ------------------------------------------------------------------------------------------------------------------  Chemistries   Recent Labs Lab 02/13/17 0042  NA 139  K 2.8*  CL 105  CO2 27  GLUCOSE 159*  BUN 5*  CREATININE 0.57  CALCIUM 8.4*   ------------------------------------------------------------------------------------------------------------------  Cardiac Enzymes  Recent Labs Lab 02/12/17 1230  TROPONINI <0.03   ------------------------------------------------------------------------------------------------------------------  RADIOLOGY:  Dg Chest 2 View  Result Date: 02/12/2017 CLINICAL DATA:  Chest pain EXAM: CHEST  2 VIEW COMPARISON:  None. FINDINGS: Cardiac enlargement without heart failure. Lungs are clear without infiltrate effusion or mass. Surgical clips in the thyroid bed bilaterally. IMPRESSION: No active cardiopulmonary disease. Electronically Signed   By: Franchot Gallo M.D.   On: 02/12/2017 13:15   Ct Angio Chest Pe W Or Wo Contrast  Result Date: 02/12/2017 CLINICAL DATA:  Chest pain radiating to back x2 weeks EXAM: CT ANGIOGRAPHY CHEST WITH CONTRAST TECHNIQUE: Multidetector CT imaging of the chest was performed using the standard protocol during bolus administration of intravenous contrast. Multiplanar CT image reconstructions and MIPs were obtained to evaluate the vascular anatomy. CONTRAST:  75 mL Isovue 370 IV COMPARISON:  Chest radiographs dated 02/12/2017 FINDINGS: Cardiovascular: Satisfactory opacification of the bilateral pulmonary arteries to the segmental level. Emboli in the distal right main pulmonary artery  (series 5/ image 98) extending into the right  upper, middle, and lower lobe pulmonary arteries. Emboli at the bifurcation of the left upper and lower lobe pulmonary artery is (series 5/ image 91), extending primarily into branches of the left lower lobe pulmonary artery. Overall clot burden is moderate to large. RV to LV ratio equals 0.91, without evidence of right heart strain. Heart is top-normal in size.  No pericardial effusion. No evidence of thoracic aortic aneurysm. Mediastinum/Nodes: No suspicious mediastinal lymphadenopathy. Status post thyroidectomy. Lungs/Pleura: Patchy/ground-glass wedge-shaped opacity anteriorly in the inferior right middle lobe (series 6/image 83), likely reflecting developing pulmonary infarct. Mild dependent atelectasis in the bilateral lower lobes. Linear atelectasis in the lingula. No focal consolidation. No suspicious pulmonary nodules. No pleural effusion or pneumothorax. Upper Abdomen: Visualized upper abdomen is notable for hepatic steatosis with focal fatty sparing along the gallbladder fossa. Musculoskeletal: Visualized osseous structures are within normal limits. Review of the MIP images confirms the above findings. IMPRESSION: Bilateral pulmonary emboli in the distal main pulmonary arteries and extending into all lobes. Overall clot burden is moderate to large. No evidence of right heart strain. Small developing pulmonary infarct inferiorly in the right middle lobe. Critical Value/emergent results were called by telephone at the time of interpretation on 02/12/2017 at 5:36 pm to Dr. Nance Pear , who verbally acknowledged these results. Electronically Signed   By: Julian Hy M.D.   On: 02/12/2017 17:38   US Venous Img Lower Bilateral  Result Date: 02/13/2017 CLINICAL DATA:  Right leg pain for 1 week. EXAM: BILATERAL LOWER EXTREMITY VENOUS DOPPLER ULTRASOUND TECHNIQUE: Gray-scale sonography with graded compression, as well as color Doppler and duplex ultrasound  were performed to evaluate the lower extremity deep venous systems from the level of the common femoral vein and including the common femoral, femoral, profunda femoral, popliteal and calf veins including the posterior tibial, peroneal and gastrocnemius veins when visible. The superficial great saphenous vein was also interrogated. Spectral Doppler was utilized to evaluate flow at rest and with distal augmentation maneuvers in the common femoral, femoral and popliteal veins. COMPARISON:  None. FINDINGS: RIGHT LOWER EXTREMITY Common Femoral Vein: Noncompressible with some flow identified on Doppler consistent with nonocclusive thrombus. Saphenofemoral Junction: No evidence of thrombus. Normal compressibility and flow on color Doppler imaging. Profunda Femoral Vein: No evidence of thrombus. Normal compressibility and flow on color Doppler imaging. Femoral Vein: Noncompressible with flow noted on Doppler consistent with nonocclusive thrombus. Popliteal Vein: No evidence of thrombus. Normal compressibility, respiratory phasicity and response to augmentation. Calf Veins: No evidence of thrombus. Normal compressibility and flow on color Doppler imaging. Venous Reflux:  None. Other Findings:  None. LEFT LOWER EXTREMITY Common Femoral Vein: No evidence of thrombus. Normal compressibility, respiratory phasicity and response to augmentation. Saphenofemoral Junction: No evidence of thrombus. Normal compressibility and flow on color Doppler imaging. Profunda Femoral Vein: No evidence of thrombus. Normal compressibility and flow on color Doppler imaging. Femoral Vein: No evidence of thrombus. Normal compressibility, respiratory phasicity and response to augmentation. Popliteal Vein: No evidence of thrombus. Normal compressibility, respiratory phasicity and response to augmentation. Calf Veins: No evidence of thrombus. Normal compressibility and flow on color Doppler imaging. Superficial Great Saphenous Vein: No evidence of  thrombus. Normal compressibility and flow on color Doppler imaging. Venous Reflux:  None. Other Findings:  None. IMPRESSION: Deep venous thrombosis is noted within the right common femoral and superficial femoral veins. No evidence of deep venous thrombosis is noted in the left lower extremity. Electronically Signed   By: Marijo Conception, M.D.  On: 02/13/2017 09:57    ASSESSMENT AND PLAN:   Principal Problem:   Pulmonary embolism (HCC)  * Bilateral pulmonary emboli with large clot burden and possible pulmonary infarct.   Heparin IV drip ordered. Continue for now. Still have pain and tightness in chest.   May help to change to oral anticoagulant once patient is stable.   waiting for echocardiogram to check right heart strain.   Bilateral leg Doppler studies ordered, showed DVT in RLE. Called vascular consult , may need IVC filter.   Advice to follow with hematology clinic as outpatient.  * Diabetes   Continue home medication and keep on sliding scale coverage.  * Fibromyalgia and chronic pain.   Continue medications.  * Hypothyroidism   Continue levothyroxine.  * Hypertension    Continue lisinopril and hydrochlorothiazide.      All the records are reviewed and case discussed with Care Management/Social Workerr. Management plans discussed with the patient, family and they are in agreement.  CODE STATUS: full  TOTAL TIME TAKING CARE OF THIS PATIENT: 35 minutes.     POSSIBLE D/C IN 1-2 DAYS, DEPENDING ON CLINICAL CONDITION.   Vaughan Basta M.D on 02/13/2017   Between 7am to 6pm - Pager - [phone]  After 6pm go to [email or web address] - password [email or web address] [facility]  Office  [phone]  CC: Primary care physician; Perrin Maltese, MD  Note: This dictation was prepared with Dragon dictation along with smaller phrase technology. Any transcriptional errors that result from this process are unintentional.

## 2017-02-13 NOTE — Progress Notes (Signed)
K+ level 2.8. MD Marcille Blanco made aware. PO potassium chloride 450 mEq ordered. Will continue to monitor.

## 2017-02-14 ENCOUNTER — Encounter
Admission: EM | Disposition: A | Discharge status: Skilled Nursing Facility | Payer: Self-pay | Source: Home / Self Care | Attending: Internal Medicine

## 2017-02-14 DIAGNOSIS — I2699 Other pulmonary embolism without acute cor pulmonale: Secondary | ICD-10-CM

## 2017-02-14 HISTORY — PX: IVC FILTER INSERTION: CATH118245

## 2017-02-14 LAB — GLUCOSE, CAPILLARY
GLUCOSE-CAPILLARY: 109 mg/dL — AB (ref 65–99)
GLUCOSE-CAPILLARY: 102 mg/dL — AB (ref 65–99)
Glucose-Capillary: 114 mg/dL — ABNORMAL HIGH (ref 65–99)
Glucose-Capillary: 140 mg/dL — ABNORMAL HIGH (ref 65–99)

## 2017-02-14 LAB — BASIC METABOLIC PANEL
ANION GAP: 7 (ref 5–15)
BUN: 8 mg/dL (ref 6–20)
CO2: 27 mmol/L (ref 22–32)
Calcium: 8.3 mg/dL — ABNORMAL LOW (ref 8.9–10.3)
Chloride: 106 mmol/L (ref 101–111)
Creatinine, Ser: 0.51 mg/dL (ref 0.44–1.00)
GFR calc Af Amer: >60 mL/min (ref >60)
GFR calc non Af Amer: >60 mL/min (ref >60)
GLUCOSE: 133 mg/dL — AB (ref 65–99)
POTASSIUM: 3.5 mmol/L (ref 3.5–5.1)
Sodium: 140 mmol/L (ref 135–145)

## 2017-02-14 LAB — CBC
HEMATOCRIT: 37.5 % (ref 35.0–47.0)
Hemoglobin: 12.8 g/dL (ref 12.0–16.0)
MCH: 30.1 pg (ref 26.0–34.0)
MCHC: 34.1 g/dL (ref 32.0–36.0)
MCV: 88.4 fL (ref 80.0–100.0)
Platelets: 218 10*3/uL (ref 150–440)
RBC: 4.24 MIL/uL (ref 3.80–5.20)
RDW: 13.5 % (ref 11.5–14.5)
WBC: 7.2 10*3/uL (ref 3.6–11.0)

## 2017-02-14 LAB — HIV ANTIBODY (ROUTINE TESTING W REFLEX): HIV Screen 4th Generation wRfx: NONREACTIVE

## 2017-02-14 LAB — HEPARIN LEVEL (UNFRACTIONATED): Heparin Unfractionated: 0.3 IU/mL (ref 0.30–0.70)

## 2017-02-14 SURGERY — IVC FILTER INSERTION
Anesthesia: Moderate Sedation

## 2017-02-14 MED ORDER — LIDOCAINE HCL (PF) 1 % IJ SOLN
INTRAMUSCULAR | Status: AC
  Filled 2017-02-14: qty 30

## 2017-02-14 MED ORDER — MIDAZOLAM HCL 5 MG/5ML IJ SOLN
INTRAMUSCULAR | Status: AC
  Filled 2017-02-14: qty 5

## 2017-02-14 MED ORDER — ONDANSETRON HCL 4 MG/2ML IJ SOLN
4.0000 mg | Freq: Once | INTRAMUSCULAR | Status: AC
  Administered 2017-02-14: 4 mg via INTRAVENOUS

## 2017-02-14 MED ORDER — MIDAZOLAM HCL 2 MG/2ML IJ SOLN
INTRAMUSCULAR | Status: DC | PRN
  Administered 2017-02-14: 1 mg via INTRAVENOUS
  Administered 2017-02-14: 2 mg via INTRAVENOUS

## 2017-02-14 MED ORDER — OXYCODONE-ACETAMINOPHEN 5-325 MG PO TABS
1.0000 | ORAL_TABLET | Freq: Four times a day (QID) | ORAL | Status: DC | PRN
  Administered 2017-02-14 – 2017-02-18 (×8): 1 via ORAL
  Filled 2017-02-14 (×8): qty 1

## 2017-02-14 MED ORDER — ONDANSETRON HCL 4 MG/2ML IJ SOLN
4.0000 mg | Freq: Four times a day (QID) | INTRAMUSCULAR | Status: DC | PRN
  Administered 2017-02-14: 4 mg via INTRAVENOUS
  Filled 2017-02-14: qty 2

## 2017-02-14 MED ORDER — FENTANYL CITRATE (PF) 100 MCG/2ML IJ SOLN
INTRAMUSCULAR | Status: DC | PRN
  Administered 2017-02-14 (×2): 50 ug via INTRAVENOUS

## 2017-02-14 MED ORDER — ONDANSETRON HCL 4 MG/2ML IJ SOLN
INTRAMUSCULAR | Status: AC
  Filled 2017-02-14: qty 2

## 2017-02-14 MED ORDER — FENTANYL CITRATE (PF) 100 MCG/2ML IJ SOLN
INTRAMUSCULAR | Status: AC
  Filled 2017-02-14: qty 4

## 2017-02-14 SURGICAL SUPPLY — 3 items
KIT FEMORAL DEL DENALI (Miscellaneous) ×3 IMPLANT
PACK ANGIOGRAPHY (CUSTOM PROCEDURE TRAY) ×3 IMPLANT
WIRE J 3MM .035X145CM (WIRE) ×3 IMPLANT

## 2017-02-14 NOTE — Progress Notes (Signed)
ANTICOAGULATION CONSULT NOTE - Initial Consult  Pharmacy Consult for Heparin Drip  Indication: pulmonary embolus  No Known Allergies  Patient Measurements: Height: 4\' 8"  (142.2 cm) Weight: 179 lb 14.4 oz (81.6 kg) IBW/kg (Calculated) : 36.3 Heparin Dosing Weight: 60kg  Vital Signs: Temp: 98.9 F (37.2 C) (06/14 2009) Temp Source: Oral (06/14 2009) BP: 145/60 (06/14 2009) Pulse Rate: 82 (06/14 2009)  Labs:  Recent Labs  02/12/17 1230 02/12/17 1548 02/13/17 0042 02/13/17 0710 02/14/17 0448  HGB 14.8  --  13.2  --   --   HCT 44.1  --  38.6  --   --   PLT 219  --  197  --   --   APTT  --  24  --   --   --   LABPROT  --  12.8  --   --   --   INR  --  0.96  --   --   --   HEPARINUNFRC  --   --  0.34 0.40 0.30  CREATININE 0.50  --  0.57  --   --   TROPONINI <0.03  --   --   --   --     Estimated Creatinine Clearance: 67.4 mL/min (by C-G formula based on SCr of 0.57 mg/dL).   Medical History: Past Medical History:  Diagnosis Date  . Diabetes mellitus, type II ([place])   . Dislocation of hip, congenital   . Fibromyalgia   . Hyperlipidemia   . Hypertension   . Hypothyroidism   . Obesity   . Prediabetes   . Reflux   . Sleep apnea   . Spinal stenosis   . Tendonitis   . Thyroid cancer [facility])     Assessment: 56 yo female with chest pain radiating to back and right posterior calf tenderness. Pharmacy consulted for heparin dosing for PE.    Goal of Therapy:  Heparin level 0.3-0.7 units/ml Monitor platelets by anticoagulation protocol: Yes   Plan:  Baseline labs ordered DW: 60kg Give 4000 units bolus x 1 Start heparin infusion at 1050 units/hr Check anti-Xa level in 6 hours and daily while on heparin Continue to monitor H&H and platelets   6/14 00:30 heparin level 0.34. Continue current regimen. Recheck in 6 hours to confirm.  6/14 0710 HL therapeutic x 2. Continue current rate. Will recheck HL and CBC daily.  6/14 AM heparin level 0.30. Continue current  regimen. Recheck heparin level and CBC with tomorrow AM labs.  Eloise Harman, PharmD, BCPS Clinical Pharmacist 02/14/2017 6:11 AM

## 2017-02-14 NOTE — Progress Notes (Signed)
On rounds, Chaplain visited with patient and family. Patient requested prayer, which [facility] provided along with words of encouragement.    02/14/17 1410  Clinical Encounter Type  Visited With Patient and family together  Visit Type Initial;Spiritual support  Referral From Chaplain  Consult/Referral To Chaplain  Spiritual Encounters  Spiritual Needs Prayer

## 2017-02-14 NOTE — Care Management (Addendum)
Patient admitted from home with bilateral pulmonary embolus and DVT.  has been on heparin drip since admission and at present the discharge anticoagulant has not been identified. Patient lives at home alone and has extensive medical history and chronic lifelong  functional limitations.  She verbalizes concerns that she is gong to need to go to skilled nursing at discharge because she lives alone and there is no one to help her.  has medicare/medicare supplement/and tricare.  Discussed medical necessity for SNF and discussed need for physical therapy and occupational therapy assessments to assist with discharge disposition.  Patient has questions regarding physical therapy being able to "make that decision."  CM discussed the criteria / medical necessity for skilled nursing placement  covered by medicare.  the needs patient is verbalizing at present are custodial.  Discussed CM would request PT and OT consults from attending.  Discussed available home health services- SN PT OT Aide, SW.  She does not have an agency preference. Heads up referral called to [facility].

## 2017-02-14 NOTE — Progress Notes (Signed)
[place] at [place] NAME: Katie Baker    MR#:  [id number]  DATE OF BIRTH:  24-Dec-1960  SUBJECTIVE:  CHIEF COMPLAINT:   Chief Complaint  Patient presents with  . Chest Pain   Cont to have chest tightness. No hypoxia.  large B/l PE and also have RLE dvt.  REVIEW OF SYSTEMS:  CONSTITUTIONAL: No fever, fatigue or weakness.  EYES: No blurred or double vision.  EARS, NOSE, AND THROAT: No tinnitus or ear pain.  RESPIRATORY: No cough, shortness of breath, wheezing or hemoptysis.  CARDIOVASCULAR: No chest pain, orthopnea, edema.  GASTROINTESTINAL: No nausea, vomiting, diarrhea or abdominal pain.  GENITOURINARY: No dysuria, hematuria.  ENDOCRINE: No polyuria, nocturia,  HEMATOLOGY: No anemia, easy bruising or bleeding SKIN: No rash or lesion. MUSCULOSKELETAL: No joint pain or arthritis.   NEUROLOGIC: No tingling, numbness, weakness.  PSYCHIATRY: No anxiety or depression.   ROS  DRUG ALLERGIES:  No Known Allergies  VITALS:  Blood pressure (!) 145/74, pulse 77, temperature 98.2 F (36.8 C), temperature source Oral, resp. rate 20, height 4\' 8"  (1.422 m), weight 81.6 kg (179 lb 14.4 oz), SpO2 93 %.  PHYSICAL EXAMINATION:   GENERAL:  56 y.o.-year-old patient lying in the bed with no acute distress.  EYES: Pupils equal, round, reactive to light and accommodation. No scleral icterus. Extraocular muscles intact.  HEENT: Head atraumatic, normocephalic. Oropharynx and nasopharynx clear.  NECK:  Supple, no jugular venous distention. No thyroid enlargement, no tenderness.  LUNGS: Normal breath sounds bilaterally, no wheezing, rales,rhonchi or crepitation. No use of accessory muscles of respiration.  CARDIOVASCULAR: S1, S2 normal. No murmurs, rubs, or gallops.  ABDOMEN: Soft, nontender, nondistended. Bowel sounds present. No organomegaly or mass.  EXTREMITIES: No pedal edema, cyanosis, or clubbing.  NEUROLOGIC: Cranial nerves II through XII are  intact. Muscle strength 3-4/5 in lower extremities, 5/5 in upper extremities. Sensation intact. Gait not checked.  PSYCHIATRIC: The patient is alert and oriented x 3.  SKIN: No obvious rash, lesion, or ulcer.   Physical Exam LABORATORY PANEL:   CBC  Recent Labs Lab 02/14/17 0448  WBC 7.2  HGB 12.8  HCT 37.5  PLT 218   ------------------------------------------------------------------------------------------------------------------  Chemistries   Recent Labs Lab 02/14/17 0448  NA 140  K 3.5  CL 106  CO2 27  GLUCOSE 133*  BUN 8  CREATININE 0.51  CALCIUM 8.3*   ------------------------------------------------------------------------------------------------------------------  Cardiac Enzymes  Recent Labs Lab 02/12/17 1230  TROPONINI <0.03   ------------------------------------------------------------------------------------------------------------------  RADIOLOGY:  Ct Angio Chest Pe W Or Wo Contrast  Result Date: 02/12/2017 CLINICAL DATA:  Chest pain radiating to back x2 weeks EXAM: CT ANGIOGRAPHY CHEST WITH CONTRAST TECHNIQUE: Multidetector CT imaging of the chest was performed using the standard protocol during bolus administration of intravenous contrast. Multiplanar CT image reconstructions and MIPs were obtained to evaluate the vascular anatomy. CONTRAST:  75 mL Isovue 370 IV COMPARISON:  Chest radiographs dated 02/12/2017 FINDINGS: Cardiovascular: Satisfactory opacification of the bilateral pulmonary arteries to the segmental level. Emboli in the distal right main pulmonary artery (series 5/ image 98) extending into the right upper, middle, and lower lobe pulmonary arteries. Emboli at the bifurcation of the left upper and lower lobe pulmonary artery is (series 5/ image 91), extending primarily into branches of the left lower lobe pulmonary artery. Overall clot burden is moderate to large. RV to LV ratio equals 0.91, without evidence of right heart strain. Heart is  top-normal in size.  No pericardial effusion.  No evidence of thoracic aortic aneurysm. Mediastinum/Nodes: No suspicious mediastinal lymphadenopathy. Status post thyroidectomy. Lungs/Pleura: Patchy/ground-glass wedge-shaped opacity anteriorly in the inferior right middle lobe (series 6/image 83), likely reflecting developing pulmonary infarct. Mild dependent atelectasis in the bilateral lower lobes. Linear atelectasis in the lingula. No focal consolidation. No suspicious pulmonary nodules. No pleural effusion or pneumothorax. Upper Abdomen: Visualized upper abdomen is notable for hepatic steatosis with focal fatty sparing along the gallbladder fossa. Musculoskeletal: Visualized osseous structures are within normal limits. Review of the MIP images confirms the above findings. IMPRESSION: Bilateral pulmonary emboli in the distal main pulmonary arteries and extending into all lobes. Overall clot burden is moderate to large. No evidence of right heart strain. Small developing pulmonary infarct inferiorly in the right middle lobe. Critical Value/emergent results were called by telephone at the time of interpretation on 02/12/2017 at 5:36 pm to Dr. Nance Pear , who verbally acknowledged these results. Electronically Signed   By: Julian Hy M.D.   On: 02/12/2017 17:38   US Venous Img Lower Bilateral  Result Date: 02/13/2017 CLINICAL DATA:  Right leg pain for 1 week. EXAM: BILATERAL LOWER EXTREMITY VENOUS DOPPLER ULTRASOUND TECHNIQUE: Gray-scale sonography with graded compression, as well as color Doppler and duplex ultrasound were performed to evaluate the lower extremity deep venous systems from the level of the common femoral vein and including the common femoral, femoral, profunda femoral, popliteal and calf veins including the posterior tibial, peroneal and gastrocnemius veins when visible. The superficial great saphenous vein was also interrogated. Spectral Doppler was utilized to evaluate flow at rest  and with distal augmentation maneuvers in the common femoral, femoral and popliteal veins. COMPARISON:  None. FINDINGS: RIGHT LOWER EXTREMITY Common Femoral Vein: Noncompressible with some flow identified on Doppler consistent with nonocclusive thrombus. Saphenofemoral Junction: No evidence of thrombus. Normal compressibility and flow on color Doppler imaging. Profunda Femoral Vein: No evidence of thrombus. Normal compressibility and flow on color Doppler imaging. Femoral Vein: Noncompressible with flow noted on Doppler consistent with nonocclusive thrombus. Popliteal Vein: No evidence of thrombus. Normal compressibility, respiratory phasicity and response to augmentation. Calf Veins: No evidence of thrombus. Normal compressibility and flow on color Doppler imaging. Venous Reflux:  None. Other Findings:  None. LEFT LOWER EXTREMITY Common Femoral Vein: No evidence of thrombus. Normal compressibility, respiratory phasicity and response to augmentation. Saphenofemoral Junction: No evidence of thrombus. Normal compressibility and flow on color Doppler imaging. Profunda Femoral Vein: No evidence of thrombus. Normal compressibility and flow on color Doppler imaging. Femoral Vein: No evidence of thrombus. Normal compressibility, respiratory phasicity and response to augmentation. Popliteal Vein: No evidence of thrombus. Normal compressibility, respiratory phasicity and response to augmentation. Calf Veins: No evidence of thrombus. Normal compressibility and flow on color Doppler imaging. Superficial Great Saphenous Vein: No evidence of thrombus. Normal compressibility and flow on color Doppler imaging. Venous Reflux:  None. Other Findings:  None. IMPRESSION: Deep venous thrombosis is noted within the right common femoral and superficial femoral veins. No evidence of deep venous thrombosis is noted in the left lower extremity. Electronically Signed   By: Marijo Conception, M.D.   On: 02/13/2017 09:57    ASSESSMENT AND  PLAN:   Principal Problem:   Pulmonary embolism (HCC)  * Bilateral pulmonary emboli with large clot burden and possible pulmonary infarct.   Heparin IV drip ordered. Continue for now. Still have pain and tightness in chest.   May help to change to oral anticoagulant once patient is stable.   No right  heart strain per Echo.   Bilateral leg Doppler studies , showed DVT in RLE. Called vascular consult , plan is for IVC filter.   Advice to follow with hematology and vascular clinic as outpatient.  * Chest pain    She have pulmonary infarct dur to PE- added percocet to help.  * Diabetes   Continue home medication and keep on sliding scale coverage.  * Fibromyalgia and chronic pain.   Continue medications.  * Hypothyroidism   Continue levothyroxine.  * Hypertension    Continue lisinopril and hydrochlorothiazide.    All the records are reviewed and case discussed with Care Management/Social Workerr. Management plans discussed with the patient, family and they are in agreement.  CODE STATUS: full  TOTAL TIME TAKING CARE OF THIS PATIENT: 35 minutes.    POSSIBLE D/C IN 1-2 DAYS, DEPENDING ON CLINICAL CONDITION.   Vaughan Basta M.D on 02/14/2017   Between 7am to 6pm - Pager - [phone]  After 6pm go to [email or web address] - password [email or web address] [facility]  Office  [phone]  CC: Primary care physician; Perrin Maltese, MD  Note: This dictation was prepared with Dragon dictation along with smaller phrase technology. Any transcriptional errors that result from this process are unintentional.

## 2017-02-14 NOTE — Care Management Important Message (Signed)
Important Message  Patient Details  Name: Katie Baker MRN: [id number] Date of Birth: [birth year]   Medicare Important Message Given:  Yes    Katrina Stack, RN 02/14/2017, 4:25 PM

## 2017-02-14 NOTE — Progress Notes (Signed)
Pt placed on [facility] C-1 CPAP. CPAP plugged into red outlet. Pt tolerating well

## 2017-02-14 NOTE — Op Note (Signed)
[facility]   OPERATIVE NOTE    PRE-OPERATIVE DIAGNOSIS: DVT with PE  POST-OPERATIVE DIAGNOSIS: Same  PROCEDURE: 1.   Ultrasound guidance for vascular access to the right common femoral vein vein 2.   Catheter placement into the inferior vena cava 3.   Inferior venacavogram 4.   Placement of a [place] IVC filter  SURGEON: Hortencia Pilar  ASSISTANT(S): None  ANESTHESIA: Conscious sedation was administered by the interventional radiology RN under my direct supervision. IV Versed plus fentanyl were utilized. Continuous ECG, pulse oximetry and blood pressure was monitored throughout the entire procedure. Conscious sedation was for a total of 20 minutes.  ESTIMATED BLOOD LOSS: minimal  FINDING(S): 1.  Patent IVC  SPECIMEN(S):  none  INDICATIONS:   Katie Baker is a 56 y.o. y.o. female who presents with DVT associated with bilateral large pulmonary emboli and severe respiratory compromise.  Inferior vena cava filter is indicated for this reason.  Risks and benefits including filter thrombosis, migration, fracture, bleeding, and infection were all discussed.  We discussed that all IVC filters that we place can be removed if desired from the patient once the need for the filter has passed.    DESCRIPTION: After obtaining full informed written consent, the patient was brought back to the vascular suite. The skin was sterilely prepped and draped in a sterile surgical field was created. Ultrasound was placed in a sterile sleeve. The right common femoral vein was echolucent and compressible indicating patency. Image was recorded for the permanent record. The puncture was made under continuous real-time ultrasound guidance.  The right common femoral vein was accessed under direct ultrasound guidance without difficulty with a micropuncture needle. Microwire was then advanced under fluoroscopic guidance without difficulty. Micro-sheath was then inserted and a J-wire was  then placed. The dilator is passed over the wire and the delivery sheath was placed into the inferior vena cava.  Inferior venacavogram was performed. This demonstrated a patent IVC with the level of the renal veins at the level of L2.  The filter was then deployed into the inferior vena cava at the level of L2-L3 just below the renal veins. The delivery sheath was then removed. Pressure was held. Sterile dressings were placed. The patient tolerated the procedure well and was taken to the recovery room in stable condition.  Interpretation: The inferior vena cava is widely patent. It measured 18 mm in diameter. Renal blushes are at the level of the top of L2. Filter is deployed with its tip at the L2-L3 level in good orientation  COMPLICATIONS: None  CONDITION: Stable  Hortencia Pilar  02/14/2017, 4:18 PM

## 2017-02-15 LAB — GLUCOSE, CAPILLARY
GLUCOSE-CAPILLARY: 125 mg/dL — AB (ref 65–99)
GLUCOSE-CAPILLARY: 140 mg/dL — AB (ref 65–99)
Glucose-Capillary: 113 mg/dL — ABNORMAL HIGH (ref 65–99)
Glucose-Capillary: 145 mg/dL — ABNORMAL HIGH (ref 65–99)

## 2017-02-15 LAB — CBC
HCT: 39.5 % (ref 35.0–47.0)
Hemoglobin: 13.4 g/dL (ref 12.0–16.0)
MCH: 29.7 pg (ref 26.0–34.0)
MCHC: 33.8 g/dL (ref 32.0–36.0)
MCV: 87.9 fL (ref 80.0–100.0)
PLATELETS: 217 10*3/uL (ref 150–440)
RBC: 4.5 MIL/uL (ref 3.80–5.20)
RDW: 13.4 % (ref 11.5–14.5)
WBC: 8.1 10*3/uL (ref 3.6–11.0)

## 2017-02-15 LAB — HEPARIN LEVEL (UNFRACTIONATED): Heparin Unfractionated: 0.27 IU/mL — ABNORMAL LOW (ref 0.30–0.70)

## 2017-02-15 MED ORDER — HEPARIN BOLUS VIA INFUSION
900.0000 [IU] | Freq: Once | INTRAVENOUS | Status: AC
  Administered 2017-02-15: 900 [IU] via INTRAVENOUS
  Filled 2017-02-15: qty 900

## 2017-02-15 MED ORDER — APIXABAN 5 MG PO TABS: 5.0000 mg | ORAL_TABLET | Freq: Two times a day (BID) | ORAL | Status: DC

## 2017-02-15 MED ORDER — SODIUM CHLORIDE 0.9% FLUSH
3.0000 mL | Freq: Two times a day (BID) | INTRAVENOUS | Status: DC
  Administered 2017-02-15 – 2017-02-18 (×6): 3 mL via INTRAVENOUS

## 2017-02-15 MED ORDER — APIXABAN 5 MG PO TABS
10.0000 mg | ORAL_TABLET | Freq: Two times a day (BID) | ORAL | Status: DC
  Administered 2017-02-15 – 2017-02-18 (×7): 10 mg via ORAL
  Filled 2017-02-15 (×7): qty 2

## 2017-02-15 NOTE — Progress Notes (Signed)
ANTICOAGULATION CONSULT NOTE - Initial Consult  Pharmacy Consult for Heparin Drip  Indication: pulmonary embolus  No Known Allergies  Patient Measurements: Height: 4\' 8"  (142.2 cm) Weight: 179 lb (81.2 kg) IBW/kg (Calculated) : 36.3 Heparin Dosing Weight: 60kg  Vital Signs: Temp: 98.2 F (36.8 C) (06/16 0500) Temp Source: Oral (06/16 0500) BP: 112/62 (06/16 0500) Pulse Rate: 74 (06/16 0500)  Labs:  Recent Labs  02/12/17 1230 02/12/17 1548  02/13/17 0042 02/13/17 0710 02/14/17 0448 02/15/17 0550  HGB 14.8  --   --  13.2  --  12.8 13.4  HCT 44.1  --   --  38.6  --  37.5 39.5  PLT 219  --   --  197  --  218 217  APTT  --  24  --   --   --   --   --   LABPROT  --  12.8  --   --   --   --   --   INR  --  0.96  --   --   --   --   --   HEPARINUNFRC  --   --   < > 0.34 0.40 0.30 0.27*  CREATININE 0.50  --   --  0.57  --  0.51  --   TROPONINI <0.03  --   --   --   --   --   --   < > = values in this interval not displayed.  Estimated Creatinine Clearance: 67.3 mL/min (by C-G formula based on SCr of 0.51 mg/dL).   Medical History: Past Medical History:  Diagnosis Date  . Diabetes mellitus, type II ([place])   . Dislocation of hip, congenital   . Fibromyalgia   . Hyperlipidemia   . Hypertension   . Hypothyroidism   . Obesity   . Prediabetes   . Reflux   . Sleep apnea   . Spinal stenosis   . Tendonitis   . Thyroid cancer [facility])     Assessment: 56 yo female with chest pain radiating to back and right posterior calf tenderness. Pharmacy consulted for heparin dosing for PE.    Goal of Therapy:  Heparin level 0.3-0.7 units/ml Monitor platelets by anticoagulation protocol: Yes   Plan:  Baseline labs ordered DW: 60kg Give 4000 units bolus x 1 Start heparin infusion at 1050 units/hr Check anti-Xa level in 6 hours and daily while on heparin Continue to monitor H&H and platelets   6/14 00:30 heparin level 0.34. Continue current regimen. Recheck in 6 hours to  confirm.  6/14 0710 HL therapeutic x 2. Continue current rate. Will recheck HL and CBC daily.  6/14 AM heparin level 0.30. Continue current regimen. Recheck heparin level and CBC with tomorrow AM labs.  6/15 AM heparin level 0.27. 900 unit bolus and increase rate to 1150 units/hr. Recheck in 6 hours.  Eloise Harman, PharmD, BCPS Clinical Pharmacist 02/15/2017 6:28 AM

## 2017-02-15 NOTE — Evaluation (Signed)
Occupational Therapy Evaluation Patient Details Name: Katie Baker MRN: [id number] DOB: 11/01/1960 Today's Date: 02/15/2017    History of Present Illness  Pt. is a 56 y.o. female who presents with DVT associated with bilateral large pulmonary emboli and severe respiratory compromise. Pt. was admitted to [facility] for IVC Filter placement. Pt. PMHx includes: Type II DM, Hx of congenial Hip Dislocations, fibrmyalgia, hyperlipidemia, spinal stenosis, and Thyroid CA,   Clinical Impression   Pt. is a 56 y.o. female who presents who was admitted to [facility] with bilateral large pulmonary emboli. Pt. Underwent IVC Filter placement. Pt. Presents with 100/10 pain, weakness, decreased functional mobility, and decreased activity tolerance. Pt. resides at home alone, and was having difficulty managing daily self care tasks efficiently at home. Pt. Was able to manage medicines, and prepare light meals, however pt. Reports she was struggling. Pt. daughter assists with grocery shopping, and accompanies pt. To appointments. Pt. now requires more assist to complete daily basic self-care tasks. Pt. Daughter has recently moved 2 hours away, and is pregnant due in 3 months. Pt. Reports her daughter will not be able to assist as much. Pt. Could benefit from skilled OT services for ADL training, A/E training, UE there. Ex, there. Act., and pt. education about energy conservation, work simplification, and DME. Pt. Could benefit from SNF level of care upon discharge.    Follow Up Recommendations  SNF    Equipment Recommendations       Recommendations for Other Services       Precautions / Restrictions Precautions Precautions: None Precaution Comments: (P) High fall risk Restrictions Weight Bearing Restrictions: No         Balance Overall balance assessment: (P) Needs assistance Sitting-balance support: (P) Bilateral upper extremity supported;Feet supported   Sitting balance - Comments: (P) CGA for sitting  balance, secondary to impaired strength and pain.   Standing balance support: (P) Bilateral upper extremity supported   Standing balance comment: (P) RW required for B UE support and ModA required to maintain upright position.                           ADL either performed or assessed with clinical judgement   ADL Overall ADL's : Needs assistance/impaired Eating/Feeding: Set up   Grooming: Minimal assistance   Upper Body Bathing: Moderate assistance   Lower Body Bathing: Maximal assistance   Upper Body Dressing : Moderate assistance   Lower Body Dressing: Maximal assistance                       Vision  No change from baseline       Perception     Praxis      Pertinent Vitals/Pain Pain Assessment: 0-10 Pain Score: 10-Worst pain ever ("100/10") Pain Location: All over Pain Descriptors / Indicators: (P) Constant Pain Intervention(s): Limited activity within patient's tolerance;Monitored during session     Hand Dominance Right   Extremity/Trunk Assessment Upper Extremity Assessment Upper Extremity Assessment: Overall WFL for tasks assessed     Communication Communication Communication: No difficulties   Cognition Arousal/Alertness: Awake/alert Behavior During Therapy: WFL for tasks assessed/performed Overall Cognitive Status: Within Functional Limits for tasks assessed                                     General Comments       Exercises Exercises: (  P) Other exercises   Shoulder Instructions      Home Living Family/patient expects to be discharged to:: Private residence Living Arrangements: Children (Daughter is now living 2 hours away, and is expecting. ) Available Help at Discharge: Family Type of Home: House Home Access: (P) Level entry     Home Layout: Two level Alternate Level Stairs-Number of Steps: (P) ~21 Alternate Level Stairs-Rails: (P) Right Bathroom Shower/Tub:  Sales executive is upstairs, 1/2 bathroom  downstairs.)     Bathroom Accessibility: No   Home Equipment: Walker - 4 wheels          Prior Functioning/Environment Level of Independence: Needs assistance    ADL's / Homemaking Assistance Needed: Pt. was living alone, had difficulty performing self-care tasks at home secondary multiple medcial problems. Pt. sleeps in a recliner, is responsible for managing medicine using a pillbox, prepares light meals with difficulty. Daughter assists in buying groceries.   Comments: (P) Has been living on main level, secondary to disabilities surrounding hip surgeries. Amb with RW within the home.         OT Problem List: Decreased strength;Pain;Decreased safety awareness;Decreased activity tolerance;Decreased coordination      OT Treatment/Interventions: Self-care/ADL training;Therapeutic exercise;Patient/family education;Therapeutic activities;DME and/or AE instruction;Energy conservation    OT Goals(Current goals can be found in the care plan section) Acute Rehab OT Goals Patient Stated Goal: To go to rehab OT Goal Formulation: With patient Potential to Achieve Goals: Good  OT Frequency: Min 2X/week   Barriers to D/C:            Co-evaluation              AM-PAC PT "6 Clicks" Daily Activity     Outcome Measure Help from another person eating meals?: None Help from another person taking care of personal grooming?: A Little Help from another person toileting, which includes using toliet, bedpan, or urinal?: A Lot Help from another person bathing (including washing, rinsing, drying)?: A Lot Help from another person to put on and taking off regular upper body clothing?: A Lot Help from another person to put on and taking off regular lower body clothing?: A Lot 6 Click Score: 15   End of Session    Activity Tolerance: Patient limited by pain Patient left: in bed;with call bell/phone within reach;with bed alarm set  OT Visit Diagnosis: Muscle weakness (generalized)  (M62.81);Pain                Time: 1410-1445 OT Time Calculation (min): 35 min Charges:  OT General Charges $OT Visit: 1 Procedure OT Evaluation $OT Eval Moderate Complexity: 1 Procedure G-Codes:     [name], MS, OTR/L  [name], MS, OTR/L 02/15/2017, 4:09 PM

## 2017-02-15 NOTE — Progress Notes (Signed)
[facility] [place] NAME: Katie Baker    MR#:  [id number]  DATE OF BIRTH:  11/28/1960  SUBJECTIVE:  CHIEF COMPLAINT:   Chief Complaint  Patient presents with  . Chest Pain   - admitted for DVT and PE-on heparin drip now- hoping to go to rehab PT pending  REVIEW OF SYSTEMS:  Review of Systems  Constitutional: Positive for malaise/fatigue. Negative for chills and fever.  HENT: Negative for ear discharge, ear pain, hearing loss and nosebleeds.   Eyes: Negative for blurred vision and double vision.  Respiratory: Negative for cough, shortness of breath and wheezing.   Cardiovascular: Negative for chest pain, palpitations and leg swelling.  Gastrointestinal: Negative for abdominal pain, constipation, diarrhea, nausea and vomiting.  Genitourinary: Negative for dysuria.  Musculoskeletal: Positive for back pain and myalgias.  Neurological: Negative for dizziness, sensory change, speech change, focal weakness, seizures and headaches.  Psychiatric/Behavioral: Negative for depression.    DRUG ALLERGIES:  No Known Allergies  VITALS:  Blood pressure (!) 155/74, pulse 75, temperature 99.4 F (37.4 C), temperature source Oral, resp. rate 18, height 4\' 8"  (1.422 m), weight 81.2 kg (179 lb), SpO2 97 %.  PHYSICAL EXAMINATION:  Physical Exam  GENERAL:  56 y.o.-year-old obese patient lying in the bed with no acute distress.  EYES: Pupils equal, round, reactive to light and accommodation. No scleral icterus. Extraocular muscles intact.  HEENT: Head atraumatic, normocephalic. Oropharynx and nasopharynx clear.  NECK:  Supple, no jugular venous distention. No thyroid enlargement, no tenderness.  LUNGS: Normal breath sounds bilaterally, no wheezing, rales,rhonchi or crepitation. No use of accessory muscles of respiration. Decreased bibasilar breath sounds CARDIOVASCULAR: S1, S2 normal. No murmurs, rubs, or gallops.  ABDOMEN: Soft, nontender,  nondistended. Bowel sounds present. No organomegaly or mass.  EXTREMITIES: No  cyanosis, or clubbing. 1+ bilateral pedal edema present NEUROLOGIC: Cranial nerves II through XII are intact. Muscle strength 5/5 in all extremities. Sensation intact. Gait not checked. Global weakness noted PSYCHIATRIC: The patient is alert and oriented x 3.  SKIN: No obvious rash, lesion, or ulcer.    LABORATORY PANEL:   CBC  Recent Labs Lab 02/15/17 0550  WBC 8.1  HGB 13.4  HCT 39.5  PLT 217   ------------------------------------------------------------------------------------------------------------------  Chemistries   Recent Labs Lab 02/14/17 0448  NA 140  K 3.5  CL 106  CO2 27  GLUCOSE 133*  BUN 8  CREATININE 0.51  CALCIUM 8.3*   ------------------------------------------------------------------------------------------------------------------  Cardiac Enzymes  Recent Labs Lab 02/12/17 1230  TROPONINI <0.03   ------------------------------------------------------------------------------------------------------------------  RADIOLOGY:  No results found.  EKG:   Orders placed or performed during the hospital encounter of 02/12/17  . EKG 12-Lead  . EKG 12-Lead  . ED EKG within 10 minutes  . ED EKG within 10 minutes    ASSESSMENT AND PLAN:   56y/o F with PMH of hypertension, obesity, h/o thyroid cancer, fibromyalgia, arthritis, spinal stenosis admitted for dyspnea and chest pain and back pain.  #1 Bilateral PE and  Right sided DVT- on IV heparin drip- change to eliquis today Appreciate vascular consult and she is s/p IVC filter now PT consult and likely SNF placement ECHO with no right heart strain F/u with hematology as outpatient  #2 DM- SSI and tradjenta  #3 Fibromyalgia- MS contin and prn pain meds  #4 Hypothyroidism- on synthroid  #5 DVT Prophylaxis- on eliquis now  Social worker consulted PT consult   All the records are reviewed and  case discussed  with Care Management/Social Workerr. Management plans discussed with the patient, family and they are in agreement.  CODE STATUS: full code  TOTAL TIME TAKING CARE OF THIS PATIENT: 39 minutes.   POSSIBLE D/C IN 1-2 DAYS, DEPENDING ON CLINICAL CONDITION.   Gladstone Lighter M.D on 02/15/2017 at 4:45 PM  Between 7am to 6pm - Pager - [phone]  After 6pm go to [email or web address] - password [email or web address] [facility]  Office  [phone]  CC: Primary care physician; Perrin Maltese, MD

## 2017-02-15 NOTE — Evaluation (Signed)
Physical Therapy Evaluation Patient Details Name: Katie Baker MRN: [id number] DOB: 1961-02-14 Today's Date: 02/15/2017   History of Present Illness   Pt. is a 56 y.o. y.o. female who presents with DVT associated with bilateral large pulmonary emboli and severe respiratory compromise. Pt. was admitted to [facility] on 02/12/17 and underwent IVC Filter procedure on 02/14/17. Prior to admission pt was independent with ADL's, using RW for mobility purposes. Pt PMHx includes: Type II DM, Hx of congenial Hip Dislocations, fibrmyalgia, hyperlipidemia, spinal stenosis, and Thyroid CA,  Clinical Impression  Pt is a 56 y.o. F, admitted to acute care for B large pulmonary emboli and severe respiratory compromise, accompanied by DVT. Pt has been on anticoagulants for >48 hours at this time. Pt performs bed mobility, transfers and amb (with RW) with ModA +2 for safety concerns secondary to LE weakness, pain, and fear of moving. Pt highly sensitive to physical contact and prefers not to be touched; pt prefers detailed description of SPT movements and hand placements throughout entirety of treatment due to high pain levels and fear of moving. Pt O2 sats maintained >90% throughout duration of treatment. Pt presents with the following deficits: strength, endurance, coordination, sequencing, and pain. Overall, pt responded well to today's treatment with no adverse affects. Pt would benefit from skilled PT to address the previously mentioned impairments and promote return to PLOF. Currently recommend SNF pending d/c.     Follow Up Recommendations SNF    Equipment Recommendations  None recommended by PT    Recommendations for Other Services       Precautions / Restrictions Precautions Precautions: None Precaution Comments: High fall risk Restrictions Weight Bearing Restrictions: No      Mobility  Bed Mobility Overal bed mobility: Needs Assistance Bed Mobility: Supine to Sit     Supine to sit: Mod assist      General bed mobility comments: Pt modA with bed mobility secondary to high pain levels, generalized muscle weakness, impaired endurance, and fear of moving. Initially pt would not allow SPT to provide assistance with supine to sit, stating, "don't touch my hip or shoulder," indicating they were in pain.  Transfers Overall transfer level: Needs assistance Equipment used: Rolling walker (2 wheeled) Transfers: Sit to/from Stand Sit to Stand: Mod assist;+2 safety/equipment         General transfer comment: Pt ModA +2 with STS, second person required for safety due to generalized LE weakness (R LE > L), pain, and fear of moving.   Ambulation/Gait Ambulation/Gait assistance: Mod assist;+2 safety/equipment Ambulation Distance (Feet): 5 Feet Assistive device: Rolling walker (2 wheeled) Gait Pattern/deviations: Decreased step length - right;Decreased step length - left;Shuffle     General Gait Details: ModA +2 due to safety concerns, generalized LE weakness (R LE > L), pain, and fear of moving.   Stairs            Wheelchair Mobility    Modified Rankin (Stroke Patients Only)       Balance Overall balance assessment: Needs assistance Sitting-balance support: Bilateral upper extremity supported;Feet supported   Sitting balance - Comments: CGA for sitting balance, secondary to impaired strength and pain.   Standing balance support: Bilateral upper extremity supported   Standing balance comment: RW required for B UE support and ModA required to maintain upright position.                             Pertinent Vitals/Pain Pain Assessment: 0-10  Pain Score: 10-Worst pain ever ("100/10") Pain Location: All over Pain Descriptors / Indicators: Constant Pain Intervention(s): Limited activity within patient's tolerance;Monitored during session    Home Living Family/patient expects to be discharged to:: Private residence Living Arrangements: Children (Daughter is now  living 2 hours away, and is expecting. ) Available Help at Discharge: Family Type of Home: House Home Access: Level entry     Home Layout: Two level Home Equipment: Environmental consultant - 4 wheels      Prior Function Level of Independence: Needs assistance      ADL's / Homemaking Assistance Needed: Pt. was living alone, had difficutly performing self-care tasks at home secondary multiple medcial problems. Pt. sleeps in a recliner, is responsible for managing medicine using a pillbox, prepares light meals with difficulty. Daughter assists in buying groceries.  Comments: Has been living on main level, secondary to disabilities surrounding hip surgeries. Amb with RW within the home.      Hand Dominance   Dominant Hand: Right    Extremity/Trunk Assessment   Upper Extremity Assessment Upper Extremity Assessment: Overall WFL for tasks assessed    Lower Extremity Assessment Lower Extremity Assessment: Generalized weakness (MMT grossly 3+/5 to B LE's)       Communication   Communication: No difficulties  Cognition Arousal/Alertness: Awake/alert Behavior During Therapy: WFL for tasks assessed/performed Overall Cognitive Status: Within Functional Limits for tasks assessed                                        General Comments      Exercises Other Exercises Other Exercises: Supine therex with supervision x10 to B LE's: ankle pumps, quad sets, glute sets. Pt education re: correct body mechanics/hand placement with RW and transfers. Other Exercises: toileting: transfered to the R with modA +2 for safety concerns related to LE weakness, pain, and fear of moving. [place] with clothing and hygiene management.     Assessment/Plan    PT Assessment Patient needs continued PT services  PT Problem List Decreased strength;Decreased range of motion;Decreased activity tolerance;Decreased balance;Decreased mobility;Decreased coordination;Pain;Obesity       PT Treatment Interventions       PT Goals (Current goals can be found in the Care Plan section)  Acute Rehab PT Goals Patient Stated Goal: To go to rehab PT Goal Formulation: With patient Potential to Achieve Goals: Fair    Frequency Min 2X/week   Barriers to discharge        Co-evaluation               AM-PAC PT "6 Clicks" Daily Activity  Outcome Measure Difficulty turning over in bed (including adjusting bedclothes, sheets and blankets)?: Total Difficulty moving from lying on back to sitting on the side of the bed? : Total Difficulty sitting down on and standing up from a chair with arms (e.g., wheelchair, bedside commode, etc,.)?: Total Help needed moving to and from a bed to chair (including a wheelchair)?: A Lot Help needed walking in hospital room?: A Lot Help needed climbing 3-5 steps with a railing? : Total 6 Click Score: 8    End of Session Equipment Utilized During Treatment: Gait belt Activity Tolerance: Patient limited by pain Patient left: in bed;with call bell/phone within reach;with bed alarm set Nurse Communication: Mobility status PT Visit Diagnosis: Unsteadiness on feet (R26.81);Other abnormalities of gait and mobility (R26.89);Muscle weakness (generalized) (M62.81);Pain Pain - part of body:  ("  all over")    Time: 7445-1460 PT Time Calculation (min) (ACUTE ONLY): 45 min   Charges:         PT G Codes:        Oran Rein PT, SPT   Bevelyn Ngo 02/15/2017, 4:15 PM

## 2017-02-15 NOTE — Progress Notes (Signed)
Pt complains of feeling hot. Temp of 99.4 recorded. I will continue to assess.

## 2017-02-15 NOTE — Clinical Social Work Note (Signed)
CSW received consult that PT recommends STR. CSW will assess when able.   Santiago Bumpers, MSW, Latanya Presser [phone]

## 2017-02-15 NOTE — Progress Notes (Addendum)
ANTICOAGULATION CONSULT NOTE - Initial Consult  Pharmacy Consult for Apixaban Dosing  Indication: pulmonary embolus  No Known Allergies  Patient Measurements: Height: 4\' 8"  (142.2 cm) Weight: 179 lb (81.2 kg) IBW/kg (Calculated) : 36.3  Vital Signs: Temp: 98.3 F (36.8 C) (06/16 1058) Temp Source: Oral (06/16 1058) BP: 155/74 (06/16 1058) Pulse Rate: 75 (06/16 1058)  Labs:  Recent Labs  02/12/17 1548  02/13/17 0042 02/13/17 0710 02/14/17 0448 02/15/17 0550  HGB  --   < > 13.2  --  12.8 13.4  HCT  --   --  38.6  --  37.5 39.5  PLT  --   --  197  --  218 217  APTT 24  --   --   --   --   --   LABPROT 12.8  --   --   --   --   --   INR 0.96  --   --   --   --   --   HEPARINUNFRC  --   < > 0.34 0.40 0.30 0.27*  CREATININE  --   --  0.57  --  0.51  --   < > = values in this interval not displayed.  Estimated Creatinine Clearance: 67.3 mL/min (by C-G formula based on SCr of 0.51 mg/dL).   Medical History: Past Medical History:  Diagnosis Date  . Diabetes mellitus, type II ([place])   . Dislocation of hip, congenital   . Fibromyalgia   . Hyperlipidemia   . Hypertension   . Hypothyroidism   . Obesity   . Prediabetes   . Reflux   . Sleep apnea   . Spinal stenosis   . Tendonitis   . Thyroid cancer [facility])     Assessment: 56 yo female admitted with PE. Patient was being treated with heparin drip, now transitioning patient to Apixaban.   Plan:  Heparin drip has been discontinued.  Will start patient on Eliquis (Apixaban) 10mg  BID x 7 days, followed by Eliquis 5mg  BID thereafter. Monitor patient for S/Sx of bleeding.   Pernell Dupre, PharmD, BCPS Clinical Pharmacist 02/15/2017 12:55 PM

## 2017-02-16 LAB — BASIC METABOLIC PANEL
Anion gap: 5 (ref 5–15)
BUN: 10 mg/dL (ref 6–20)
CALCIUM: 8.3 mg/dL — AB (ref 8.9–10.3)
CO2: 29 mmol/L (ref 22–32)
CREATININE: 0.63 mg/dL (ref 0.44–1.00)
Chloride: 106 mmol/L (ref 101–111)
GFR calc Af Amer: >60 mL/min (ref >60)
GLUCOSE: 123 mg/dL — AB (ref 65–99)
Potassium: 3.5 mmol/L (ref 3.5–5.1)
Sodium: 140 mmol/L (ref 135–145)

## 2017-02-16 LAB — GLUCOSE, CAPILLARY
GLUCOSE-CAPILLARY: 128 mg/dL — AB (ref 65–99)
Glucose-Capillary: 119 mg/dL — ABNORMAL HIGH (ref 65–99)
Glucose-Capillary: 137 mg/dL — ABNORMAL HIGH (ref 65–99)
Glucose-Capillary: 119 mg/dL — ABNORMAL HIGH (ref 65–99)

## 2017-02-16 MED ORDER — SODIUM CHLORIDE 0.9% FLUSH
3.0000 mL | Freq: Two times a day (BID) | INTRAVENOUS | Status: DC
  Administered 2017-02-16 – 2017-02-17 (×3): 3 mL via INTRAVENOUS

## 2017-02-16 MED ORDER — POLYETHYLENE GLYCOL 3350 17 G PO PACK
17.0000 g | PACK | Freq: Every day | ORAL | Status: DC
  Administered 2017-02-16 – 2017-02-18 (×3): 17 g via ORAL
  Filled 2017-02-16 (×3): qty 1

## 2017-02-16 MED ORDER — SENNOSIDES-DOCUSATE SODIUM 8.6-50 MG PO TABS
2.0000 | ORAL_TABLET | Freq: Two times a day (BID) | ORAL | Status: DC
  Administered 2017-02-16 – 2017-02-18 (×5): 2 via ORAL
  Filled 2017-02-16 (×5): qty 2

## 2017-02-16 NOTE — Clinical Social Work Note (Signed)
Clinical Social Work Assessment  Patient Details  Name: Katie Baker MRN: [id number] Date of Birth: 24-Aug-1961  Date of referral:  02/16/17               Reason for consult:  Facility Placement                Permission sought to share information with:  Chartered certified accountant granted to share information::  Yes, Verbal Permission Granted  Name::        Agency::     Relationship::     Contact Information:     Housing/Transportation Living arrangements for the past 2 months:  Single Family Home Source of Information:  Patient Patient Interpreter Needed:  None Criminal Activity/Legal Involvement Pertinent to Current Situation/Hospitalization:  No - Comment as needed Significant Relationships:  Adult Children, Community Support Lives with:  Self Do you feel safe going back to the place where you live?  Yes Need for family participation in patient care:  No (Coment)  Care giving concerns:  PT recommendation for STR   Social Worker assessment / plan:  CSW met with the patient at bedside to discuss STR. The patient gave verbal permission to conduct a bed referral, and she seems to have a preference for [facility] place as evident by her description of the location. The patient has been researching SNFs due to an upcoming surgery, and had made the decision. CSW explained the referral process and that the patient has the option to either choose from beds offered or to discharge home with home health should she not get her preference due to needs or bed availability.   At baseline, the patient is hard of hearing but wears hearing aides and can read lips. The patient reports that she has a good relationship with her daughter. 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 The patient reports that she is selective in where she goes for care due to her experiences  throughout her health care.  Discharge is unknown but most likely in the next 1-2 days. The CSW will provide bed offers as they become available.  Employment status:  Retired Forensic scientist:  [employer] PT Recommendations:  [place] / Referral to community resources:  [place]  Patient/Family's Response to care:  The patient thanked the CSW for being "nice and for listening."  Patient/Family's Understanding of and Emotional Response to Diagnosis, Current Treatment, and Prognosis:  The patient has good insight into her changing care needs and is in agreement with the recommendation for STR at a SNF.  Emotional Assessment Appearance:  Appears stated age Attitude/Demeanor/Rapport:  Lethargic Affect (typically observed):  Accepting, Appropriate, Pleasant Orientation:  Oriented to Self, Oriented to Place, Oriented to  Time, Oriented to Situation Alcohol / Substance use:  Never Used Psych involvement (Current and /or in the community):  No (Comment)  Discharge Needs  Concerns to be addressed:  Care Coordination Readmission within the last 30 days:  No Current discharge risk:  Chronically ill Barriers to Discharge:  Continued Medical Work up   [employer], LCSW 02/16/2017, 2:37 PM

## 2017-02-16 NOTE — NC FL2 (Signed)
[place] LEVEL OF CARE SCREENING TOOL     IDENTIFICATION  Patient Name: Katie Baker Birthdate: [birth year] Sex: female Admission Date (Current Location): 02/12/2017  [place] and [place] Number:  Engineering geologist and Address:  [facility], [address], [place], [place] [address]      Provider Number: [id number]  Attending Physician Name and Address:  Gladstone Lighter, MD  Relative Name and Phone Number:       Current Level of Care: Hospital Recommended Level of Care:   Prior Approval Number:    Date Approved/Denied: 02/16/17 PASRR Number: [id number]  Discharge Plan: SNF    Current Diagnoses: Patient Active Problem List   Diagnosis Date Noted  . Pulmonary embolism ([place]) 02/12/2017  . Gout 10/26/2015  . [facility] (congenital dislocation of the hip) 06/19/2015  . HLD (hyperlipidemia) 06/19/2015  . BP (high blood pressure) 06/19/2015  . Adiposity 06/19/2015  . Borderline diabetes mellitus 06/19/2015  . Obstructive apnea 06/19/2015  . H/O malignant neoplasm of thyroid 07/15/2014  . Hypothyroidism, postop 07/15/2014  . Degeneration of intervertebral disc of lumbar region 03/18/2013  . Fibromyalgia 03/18/2013  . LBP (low back pain) 03/18/2013    Orientation RESPIRATION BLADDER Height & Weight     Self, Time, Situation, Place  Normal Continent Weight: 179 lb (81.2 kg) Height:  4\' 8"  (142.2 cm)  BEHAVIORAL SYMPTOMS/MOOD NEUROLOGICAL BOWEL NUTRITION STATUS      Continent Diet (Heart healthy/Carb modified)  AMBULATORY STATUS COMMUNICATION OF NEEDS Skin   Extensive Assist Verbally Normal                       Personal Care Assistance Level of Assistance  Bathing, Feeding, Dressing Bathing Assistance: Limited assistance Feeding assistance: Independent Dressing Assistance: Limited assistance     Functional Limitations Info  Hearing   Hearing Info: Impaired      SPECIAL CARE FACTORS FREQUENCY  PT (By licensed  PT), OT (By licensed OT)     PT Frequency: Up to 5X per day, 5 days per week OT Frequency: Up to 5X per day, 5 days per week            Contractures Contractures Info: Present    Additional Factors Info                  Current Medications (02/16/2017):  This is the current hospital active medication list Current Facility-Administered Medications  Medication Dose Route Frequency Provider Last Rate Last Dose  . apixaban (ELIQUIS) tablet 10 mg  10 mg Oral BID Pernell Dupre, RPH   10 mg at 02/16/17 1051   Followed by  . [START ON 02/22/2017] apixaban (ELIQUIS) tablet 5 mg  5 mg Oral BID Hallaji, Dani Gobble, RPH      . cycloSPORINE (RESTASIS) 0.05 % ophthalmic emulsion 1 drop  1 drop Both Eyes BID Vaughan Basta, MD   1 drop at 02/16/17 1053  . diazepam (VALIUM) tablet 5 mg  5 mg Oral Q12H PRN Vaughan Basta, MD   5 mg at 02/14/17 2138  . dicyclomine (BENTYL) capsule 10 mg  10 mg Oral TID AC & HS Vaughan Basta, MD   10 mg at 02/16/17 1224  . diphenhydrAMINE (BENADRYL) capsule 25 mg  25 mg Oral QHS PRN Lance Coon, MD      . famotidine (PEPCID) tablet 20 mg  20 mg Oral BID Vaughan Basta, MD   20 mg at 02/16/17 1051  . fluticasone (FLONASE)  50 MCG/ACT nasal spray 1 spray  1 spray Each Nare Daily PRN Vaughan Basta, MD   1 spray at 02/15/17 0916  . gabapentin (NEURONTIN) capsule 300 mg  300 mg Oral BID Vaughan Basta, MD   300 mg at 02/16/17 1051  . insulin aspart (novoLOG) injection 0-9 Units  0-9 Units Subcutaneous TID WC Vaughan Basta, MD   1 Units at 02/16/17 1224  . levothyroxine (SYNTHROID, LEVOTHROID) tablet 150 mcg  150 mcg Oral QAC breakfast Vaughan Basta, MD   150 mcg at 02/16/17 [phone]  . linaclotide (LINZESS) capsule 72 mcg  72 mcg Oral QAC breakfast Vaughan Basta, MD   72 mcg at 02/16/17 0836  . linagliptin (TRADJENTA) tablet 5 mg  5 mg Oral Daily Vaughan Basta, MD   5 mg at 02/16/17 1051   . morphine (MS CONTIN) 12 hr tablet 15 mg  15 mg Oral Q12H Vaughan Basta, MD   15 mg at 02/16/17 1051  . ondansetron (ZOFRAN) injection 4 mg  4 mg Intravenous Q6H PRN Lance Coon, MD   4 mg at 02/14/17 2224  . oxyCODONE-acetaminophen (PERCOCET/ROXICET) 5-325 MG per tablet 1 tablet  1 tablet Oral Q6H PRN Vaughan Basta, MD   1 tablet at 02/16/17 0836  . pantoprazole (PROTONIX) EC tablet 40 mg  40 mg Oral Daily Vaughan Basta, MD   40 mg at 02/16/17 1051  . polyethylene glycol (MIRALAX / GLYCOLAX) packet 17 g  17 g Oral Daily Gladstone Lighter, MD      . senna-docusate (Senokot-S) tablet 2 tablet  2 tablet Oral BID Gladstone Lighter, MD      . sodium chloride flush (NS) 0.9 % injection 3 mL  3 mL Intravenous Q12H Gladstone Lighter, MD   3 mL at 02/16/17 1000     Discharge Medications: Please see discharge summary for a list of discharge medications.  Relevant Imaging Results:  Relevant Lab Results:   Additional Information SS# [id number]  Zettie Pho, LCSW

## 2017-02-16 NOTE — Clinical Social Work Placement (Addendum)
   CLINICAL SOCIAL WORK PLACEMENT  NOTE  Date:  02/16/2017  Patient Details  Name: Katie Baker MRN: [id number] Date of Birth: 04-May-1961  Clinical Social Work is seeking post-discharge placement for this patient at the [place] level of care (*CSW will initial, date and re-position this form in  chart as items are completed):  Yes   Patient/family provided with [place] Work Department's list of facilities offering this level of care within the geographic area requested by the patient (or if unable, by the patient's family).  Yes   Patient/family informed of their freedom to choose among providers that offer the needed level of care, that participate in Medicare, Medicaid or managed care program needed by the patient, have an available bed and are willing to accept the patient.  Yes   Patient/family informed of [facility]'s ownership interest in [facility] and [facility], as well as of the fact that they are under no obligation to receive care at these facilities.  PASRR submitted to EDS on 02/16/17     PASRR number received on 02/16/17     Existing PASRR number confirmed on       FL2 transmitted to all facilities in geographic area requested by pt/family on 02/16/17     FL2 transmitted to all facilities within larger geographic area on       Patient informed that his/her managed care company has contracts with or will negotiate with certain facilities, including the following:         02-17-17   Patient/family informed of bed offers received.  (Updated Evette Cristal, MSW, [place], 02-17-17)  Patient chooses bed at  [facility]  (Updated Evette Cristal, MSW, [place], 02-17-17)     Physician recommends and patient chooses bed at      Patient to be transferred to  [facility] on  02-18-17  (Updated Evette Cristal, MSW, [place], 02-17-17).  Patient to be transferred to facility by  [facility]  (Updated Evette Cristal, MSW,  [place], 02-17-17)    Patient family notified on   02-18-17 of transfer.  (Updated Evette Cristal, MSW, [place], 02-17-17)  Name of family member notified:   Daughter Katie Baker [phone]  (Updated Evette Cristal, MSW, [place], 02-17-17)      PHYSICIAN       Additional Comment:    _______________________________________________ Zettie Pho, LCSW 02/16/2017, 2:43 PM   Jones Broom. [place], MSW, [place]  02/18/2017 11:22 AM

## 2017-02-16 NOTE — Plan of Care (Signed)
Problem: Safety: Goal: Ability to remain free from injury will improve Outcome: Progressing Pt is a moderate fall risk but RN applied exit alarm 2/2 patient being very short in height. It is difficult for her to get on and off the bed. The exit alarm reminds patient that she needs assistance to  [facility].

## 2017-02-16 NOTE — Progress Notes (Signed)
[place] at [place] NAME: Katie Baker    MR#:  [id number]  DATE OF BIRTH:  July 05, 1961  SUBJECTIVE:  CHIEF COMPLAINT:   Chief Complaint  Patient presents with  . Chest Pain   -Still has some back pain and right-sided chest pain, but improving - Complaining of constipation  REVIEW OF SYSTEMS:  Review of Systems  Constitutional: Positive for malaise/fatigue. Negative for chills and fever.  HENT: Negative for ear discharge, ear pain, hearing loss and nosebleeds.   Eyes: Negative for blurred vision and double vision.  Respiratory: Negative for cough, shortness of breath and wheezing.   Cardiovascular: Negative for chest pain, palpitations and leg swelling.  Gastrointestinal: Positive for constipation. Negative for abdominal pain, diarrhea, nausea and vomiting.  Genitourinary: Negative for dysuria.  Musculoskeletal: Positive for back pain and myalgias.  Neurological: Negative for dizziness, sensory change, speech change, focal weakness, seizures and headaches.  Psychiatric/Behavioral: Negative for depression.    DRUG ALLERGIES:  No Known Allergies  VITALS:  Blood pressure (!) 121/51, pulse 75, temperature 98.4 F (36.9 C), temperature source Oral, resp. rate 18, height 4\' 8"  (1.422 m), weight 81.2 kg (179 lb), SpO2 96 %.  PHYSICAL EXAMINATION:  Physical Exam  GENERAL:  56 y.o.-year-old obese patient lying in the bed with no acute distress.  EYES: Pupils equal, round, reactive to light and accommodation. No scleral icterus. Extraocular muscles intact.  HEENT: Head atraumatic, normocephalic. Oropharynx and nasopharynx clear.  NECK:  Supple, no jugular venous distention. No thyroid enlargement, no tenderness.  LUNGS: Normal breath sounds bilaterally, no wheezing, rales,rhonchi or crepitation. No use of accessory muscles of respiration. Decreased bibasilar breath sounds CARDIOVASCULAR: S1, S2 normal. No murmurs, rubs, or gallops.    ABDOMEN: Soft, nontender, nondistended. Bowel sounds present. No organomegaly or mass.  EXTREMITIES: No  cyanosis, or clubbing. 1+ bilateral pedal edema present NEUROLOGIC: Cranial nerves II through XII are intact. Muscle strength 5/5 in all extremities. Sensation intact. Gait not checked. Global weakness noted PSYCHIATRIC: The patient is alert and oriented x 3.  SKIN: No obvious rash, lesion, or ulcer.    LABORATORY PANEL:   CBC  Recent Labs Lab 02/15/17 0550  WBC 8.1  HGB 13.4  HCT 39.5  PLT 217   ------------------------------------------------------------------------------------------------------------------  Chemistries   Recent Labs Lab 02/16/17 0515  NA 140  K 3.5  CL 106  CO2 29  GLUCOSE 123*  BUN 10  CREATININE 0.63  CALCIUM 8.3*   ------------------------------------------------------------------------------------------------------------------  Cardiac Enzymes  Recent Labs Lab 02/12/17 1230  TROPONINI <0.03   ------------------------------------------------------------------------------------------------------------------  RADIOLOGY:  No results found.  EKG:   Orders placed or performed during the hospital encounter of 02/12/17  . EKG 12-Lead  . EKG 12-Lead  . ED EKG within 10 minutes  . ED EKG within 10 minutes    ASSESSMENT AND PLAN:   55y/o F with PMH of hypertension, obesity, h/o thyroid cancer, fibromyalgia, arthritis, spinal stenosis admitted for dyspnea and chest pain and back pain.  #1 Bilateral PE and  Right sided DVT- was on IV heparin drip- changed to eliquis Appreciate vascular consult and she is s/p IVC filter now PT consult and likely SNF placement ECHO with no right heart strain F/u with hematology as outpatient  #2 DM- SSI and tradjenta  #3 Fibromyalgia- MS contin and prn pain meds Complains of constipation- meds added  #4 Hypothyroidism- on synthroid  #5 DVT Prophylaxis- on eliquis now  Education officer, museum  consulted PT consult  recommended SNF   All the records are reviewed and case discussed with Care Management/Social Workerr. Management plans discussed with the patient, family and they are in agreement.  CODE STATUS: full code  TOTAL TIME TAKING CARE OF THIS PATIENT: 36 minutes.   POSSIBLE D/C TOMORROW, DEPENDING ON CLINICAL CONDITION.   Gladstone Lighter M.D on 02/16/2017 at 1:11 PM  Between 7am to 6pm - Pager - [phone]  After 6pm go to [email or web address] - password [email or web address] [facility]  Office  [phone]  CC: Primary care physician; Perrin Maltese, MD

## 2017-02-16 NOTE — Progress Notes (Signed)
Pt refused low bed, she likes using the trapeze to reposition herself in bed. I will continue to assess.

## 2017-02-17 LAB — GLUCOSE, CAPILLARY
GLUCOSE-CAPILLARY: 91 mg/dL (ref 65–99)
GLUCOSE-CAPILLARY: 118 mg/dL — AB (ref 65–99)
GLUCOSE-CAPILLARY: 148 mg/dL — AB (ref 65–99)
GLUCOSE-CAPILLARY: 137 mg/dL — AB (ref 65–99)
Glucose-Capillary: 132 mg/dL — ABNORMAL HIGH (ref 65–99)

## 2017-02-17 MED ORDER — APIXABAN 5 MG PO TABS
5.0000 mg | ORAL_TABLET | Freq: Two times a day (BID) | ORAL | 0 refills | Status: DC
Start: 2017-02-22 — End: 2018-11-17

## 2017-02-17 MED ORDER — OXYCODONE-ACETAMINOPHEN 5-325 MG PO TABS: 1.0000 | ORAL_TABLET | Freq: Three times a day (TID) | ORAL | 0 refills | Status: DC | PRN

## 2017-02-17 MED ORDER — MORPHINE SULFATE ER 15 MG PO TBCR: 15.0000 mg | EXTENDED_RELEASE_TABLET | Freq: Two times a day (BID) | ORAL | 0 refills | Status: DC

## 2017-02-17 MED ORDER — APIXABAN 5 MG PO TABS: 10.0000 mg | ORAL_TABLET | Freq: Two times a day (BID) | ORAL | 0 refills | Status: DC

## 2017-02-17 MED ORDER — SENNOSIDES-DOCUSATE SODIUM 8.6-50 MG PO TABS: 2.0000 | ORAL_TABLET | Freq: Two times a day (BID) | ORAL | 0 refills | Status: DC

## 2017-02-17 NOTE — Progress Notes (Signed)
[place] at [place] NAME: Katie Baker    MR#:  [id number]  DATE OF BIRTH:  Aug 21, 1961  SUBJECTIVE:  CHIEF COMPLAINT:   Chief Complaint  Patient presents with  . Chest Pain   -Still has some back pain and right-sided chest pain, but improving  REVIEW OF SYSTEMS:  Review of Systems  Constitutional: Positive for malaise/fatigue. Negative for chills and fever.  HENT: Negative for ear discharge, ear pain, hearing loss and nosebleeds.   Eyes: Negative for blurred vision and double vision.  Respiratory: Negative for cough, shortness of breath and wheezing.   Cardiovascular: Negative for chest pain, palpitations and leg swelling.  Gastrointestinal: Positive for constipation. Negative for abdominal pain, diarrhea, nausea and vomiting.  Genitourinary: Negative for dysuria.  Musculoskeletal: Positive for back pain and myalgias.  Neurological: Negative for dizziness, sensory change, speech change, focal weakness, seizures and headaches.  Psychiatric/Behavioral: Negative for depression.    DRUG ALLERGIES:  No Known Allergies  VITALS:  Blood pressure (!) 145/67, pulse 63, temperature 98.7 F (37.1 C), temperature source Oral, resp. rate 19, height 4\' 8"  (1.422 m), weight 81.2 kg (179 lb), SpO2 93 %.  PHYSICAL EXAMINATION:  Physical Exam  GENERAL:  56 y.o.-year-old obese patient lying in the bed with no acute distress.  EYES: Pupils equal, round, reactive to light and accommodation. No scleral icterus. Extraocular muscles intact.  HEENT: Head atraumatic, normocephalic. Oropharynx and nasopharynx clear.  NECK:  Supple, no jugular venous distention. No thyroid enlargement, no tenderness.  LUNGS: Normal breath sounds bilaterally, no wheezing, rales,rhonchi or crepitation. No use of accessory muscles of respiration. Decreased bibasilar breath sounds CARDIOVASCULAR: S1, S2 normal. No murmurs, rubs, or gallops.  ABDOMEN: Soft, nontender,  nondistended. Bowel sounds present. No organomegaly or mass.  EXTREMITIES: No  cyanosis, or clubbing. 1+ bilateral pedal edema present NEUROLOGIC: Cranial nerves II through XII are intact. Muscle strength 4/5 in all extremities. Sensation intact. Gait not checked. Global weakness noted PSYCHIATRIC: The patient is alert and oriented x 3.  SKIN: No obvious rash, lesion, or ulcer.    LABORATORY PANEL:   CBC  Recent Labs Lab 02/15/17 0550  WBC 8.1  HGB 13.4  HCT 39.5  PLT 217   ------------------------------------------------------------------------------------------------------------------  Chemistries   Recent Labs Lab 02/16/17 0515  NA 140  K 3.5  CL 106  CO2 29  GLUCOSE 123*  BUN 10  CREATININE 0.63  CALCIUM 8.3*   ------------------------------------------------------------------------------------------------------------------  Cardiac Enzymes  Recent Labs Lab 02/12/17 1230  TROPONINI <0.03   ------------------------------------------------------------------------------------------------------------------  RADIOLOGY:  No results found.  EKG:   Orders placed or performed during the hospital encounter of 02/12/17  . EKG 12-Lead  . EKG 12-Lead  . ED EKG within 10 minutes  . ED EKG within 10 minutes    ASSESSMENT AND PLAN:   55y/o F with PMH of hypertension, obesity, h/o thyroid cancer, fibromyalgia, arthritis, spinal stenosis admitted for dyspnea and chest pain and back pain.  #1 Bilateral PE and  Right sided DVT- was on IV heparin drip- changed to eliquis Appreciate vascular consult and she is s/p IVC filter now PT consult and likely SNF placement ECHO with no right heart strain F/u with hematology as outpatient  #2 DM- SSI and tradjenta  #3 Fibromyalgia- MS contin and prn pain meds Complains of constipation- meds added  #4 Hypothyroidism- on synthroid  #5 DVT Prophylaxis- on eliquis now  Social worker consulted PT consult recommended SNF   Could not  place in rehabilitation place today, as per the social worker.  Will discharge patient tomorrow.   All the records are reviewed and case discussed with Care Management/Social Workerr. Management plans discussed with the patient, family and they are in agreement.  CODE STATUS: full code  TOTAL TIME TAKING CARE OF THIS PATIENT: 36 minutes.   POSSIBLE D/C TOMORROW, DEPENDING ON CLINICAL CONDITION.   Vaughan Basta M.D on 02/17/2017 at 5:30 PM  Between 7am to 6pm - Pager - [phone]  After 6pm go to [email or web address] - password [email or web address] [facility]  Office  [phone]  CC: Primary care physician; Perrin Maltese, MD

## 2017-02-17 NOTE — Discharge Summary (Signed)
[place] at [place] NAME: Juliene Kirsh    MR#:  [id number]  DATE OF BIRTH:  11-23-1960  DATE OF ADMISSION:  02/12/2017 ADMITTING PHYSICIAN: Vaughan Basta, MD  DATE OF DISCHARGE: 02/17/2017   PRIMARY CARE PHYSICIAN: Perrin Maltese, MD    ADMISSION DIAGNOSIS:  Leg pain [M79.606] Chest pain [R07.9] Other pulmonary embolism without acute cor pulmonale, unspecified chronicity ([place]) [I26.99]  DISCHARGE DIAGNOSIS:  Principal Problem:   Pulmonary embolism ([place])   SECONDARY DIAGNOSIS:   Past Medical History:  Diagnosis Date  . Diabetes mellitus, type II ([place])   . Dislocation of hip, congenital   . Fibromyalgia   . Hyperlipidemia   . Hypertension   . Hypothyroidism   . Obesity   . Prediabetes   . Reflux   . Sleep apnea   . Spinal stenosis   . Tendonitis   . Thyroid cancer [facility])     HOSPITAL COURSE:   55y/o F with PMH of hypertension, obesity, h/o thyroid cancer, fibromyalgia, arthritis, spinal stenosis admitted for dyspnea and chest pain and back pain.  #1 Bilateral PE and  Right sided DVT- was on IV heparin drip- changed to eliquis Appreciate vascular consult and she is s/p IVC filter now PT consult and SNF placement ECHO with no right heart strain F/u with hematology as outpatient  #2 DM- SSI and tradjenta  #3 Fibromyalgia- MS contin and prn pain meds Complains of constipation- meds added  #4 Hypothyroidism- on synthroid  #5 DVT Prophylaxis- on eliquis now   DISCHARGE CONDITIONS:   Stable.  CONSULTS OBTAINED:  Treatment Team:  Sela Hua, PA-C  DRUG ALLERGIES:  No Known Allergies  DISCHARGE MEDICATIONS:   Current Discharge Medication List    START taking these medications   Details  !! apixaban (ELIQUIS) 5 MG TABS tablet Take 2 tablets (10 mg total) by mouth 2 (two) times daily. Qty: 24 tablet, Refills: 0    !! apixaban (ELIQUIS) 5 MG TABS tablet Take 1 tablet (5 mg  total) by mouth 2 (two) times daily. Start after 6 days. Qty: 60 tablet, Refills: 0    oxyCODONE-acetaminophen (PERCOCET/ROXICET) 5-325 MG tablet Take 1 tablet by mouth every 8 (eight) hours as needed for moderate pain or severe pain. Qty: 20 tablet, Refills: 0    senna-docusate (SENOKOT-S) 8.6-50 MG tablet Take 2 tablets by mouth 2 (two) times daily. Qty: 30 tablet, Refills: 0     !! - Potential duplicate medications found. Please discuss with provider.    CONTINUE these medications which have CHANGED   Details  morphine (MS CONTIN) 15 MG 12 hr tablet Take 1 tablet (15 mg total) by mouth every 12 (twelve) hours. Qty: 30 tablet, Refills: 0      CONTINUE these medications which have NOT CHANGED   Details  diazepam (VALIUM) 5 MG tablet Take 1 tablet by mouth at bedtime as needed. h Refills: 4    dicyclomine (BENTYL) 10 MG capsule Take 10 mg by mouth 4 (four) times daily -  before meals and at bedtime.    gabapentin (NEURONTIN) 300 MG capsule Take 1 cap every 12 hours Refills: 0    levothyroxine (SYNTHROID, LEVOTHROID) 150 MCG tablet Take 1 tablet by mouth daily. Refills: 1    LINZESS 72 MCG capsule Take 1 capsule by mouth daily. Refills: 1    meloxicam (MOBIC) 7.5 MG tablet Take 1 tablet by mouth daily as needed. Refills: 2    pantoprazole (PROTONIX) 40  MG tablet Take 40 mg by mouth daily. Refills: 5    PAZEO 0.7 % SOLN Place 1 drop into both eyes 2 (two) times daily as needed. Refills: 3    ranitidine (ZANTAC) 300 MG tablet Take 1 tablet by mouth daily. Refills: 1    RESTASIS 0.05 % ophthalmic emulsion Place 1 drop into both eyes 2 (two) times daily. Refills: 3    sitaGLIPtin (JANUVIA) 100 MG tablet Take 100 mg by mouth daily.    Blood Glucose Monitoring Suppl (ONE TOUCH ULTRA MINI) W/DEVICE KIT See admin instructions. Refills: 0    fluticasone (FLONASE) 50 MCG/ACT nasal spray Place 1 spray into both nostrils daily as needed. Refills: 12    glucose blood test  strip 1 each by Other route as needed for other. Use as instructed    ibuprofen (ADVIL,MOTRIN) 200 MG tablet Take 800 mg by mouth every 6 (six) hours as needed.     Melatonin 5 MG CAPS Take 1 capsule by mouth at bedtime as needed (sleep).      STOP taking these medications     busPIRone (BUSPAR) 10 MG tablet      lisinopril-hydrochlorothiazide (PRINZIDE,ZESTORETIC) 20-25 MG per tablet      ONETOUCH DELICA LANCETS 40J MISC          DISCHARGE INSTRUCTIONS:    Follow with vascular and hematology clinics.  If you experience worsening of your admission symptoms, develop shortness of breath, life threatening emergency, suicidal or homicidal thoughts you must seek medical attention immediately by calling 911 or calling your MD immediately  if symptoms less severe.  You Must read complete instructions/literature along with all the possible adverse reactions/side effects for all the Medicines you take and that have been prescribed to you. Take any new Medicines after you have completely understood and accept all the possible adverse reactions/side effects.   Please note  You were cared for by a hospitalist during your hospital stay. If you have any questions about your discharge medications or the care you received while you were in the hospital after you are discharged, you can call the unit and asked to speak with the hospitalist on call if the hospitalist that took care of you is not available. Once you are discharged, your primary care physician will handle any further medical issues. Please note that NO REFILLS for any discharge medications will be authorized once you are discharged, as it is imperative that you return to your primary care physician (or establish a relationship with a primary care physician if you do not have one) for your aftercare needs so that they can reassess your need for medications and monitor your lab values.    Today   CHIEF COMPLAINT:   Chief Complaint   Patient presents with  . Chest Pain    HISTORY OF PRESENT ILLNESS:  Katie Baker  is a 56 y.o. female with a known history of Diabetes, fibromyalgia, hyperlipidemia, hypertension, obesity, spinal stenosis, thyroid cancer- due to other multiple hip surgeries issues and replacements not able to walk steady so using a walker at home and not much mobile. Last 2 weeks she is having shortness of breath and diagnosing a chest images progressively getting worse and now also noted her legs are swollen for the last 3-4 days. SEL 1 mm the rheumatologist, went to the clinic and talked with her doctor about his complaints and they suggested to go to emergency room pediatric ER with CT angiogram she was noted to have bilateral pulmonary  emboli with large clot burden, so advised to admit to the hospitalist team.   VITAL SIGNS:  Blood pressure (!) 145/67, pulse 63, temperature 98.7 F (37.1 C), temperature source Oral, resp. rate 19, height _0  (1.422 m), weight 81.2 kg (179 lb), SpO2 93 %.  I/O:   Intake/Output Summary (Last 24 hours) at 02/17/17 1441 Last data filed at 02/17/17 1300  Gross per 24 hour  Intake              360 ml  Output             1350 ml  Net             -990 ml    PHYSICAL EXAMINATION:   GENERAL:  56 y.o.-year-old obese patient lying in the bed with no acute distress.  EYES: Pupils equal, round, reactive to light and accommodation. No scleral icterus. Extraocular muscles intact.  HEENT: Head atraumatic, normocephalic. Oropharynx and nasopharynx clear.  NECK:  Supple, no jugular venous distention. No thyroid enlargement, no tenderness.  LUNGS: Normal breath sounds bilaterally, no wheezing, rales,rhonchi or crepitation. No use of accessory muscles of respiration. Decreased bibasilar breath sounds CARDIOVASCULAR: S1, S2 normal. No murmurs, rubs, or gallops.  ABDOMEN: Soft, nontender, nondistended. Bowel sounds present. No organomegaly or mass.  EXTREMITIES: No  cyanosis, or  clubbing. 1+ bilateral pedal edema present NEUROLOGIC: Cranial nerves II through XII are intact. Muscle strength 5/5 in all extremities. Sensation intact. Gait not checked. Global weakness noted PSYCHIATRIC: The patient is alert and oriented x 3.  SKIN: No obvious rash, lesion, or ulcer.   DATA REVIEW:   CBC  Recent Labs Lab 02/15/17 0550  WBC 8.1  HGB 13.4  HCT 39.5  PLT 217    Chemistries   Recent Labs Lab 02/16/17 0515  NA 140  K 3.5  CL 106  CO2 29  GLUCOSE 123*  BUN 10  CREATININE 0.63  CALCIUM 8.3*    Cardiac Enzymes  Recent Labs Lab 02/12/17 1230  [place] <0.03    Microbiology Results  No results found for this or any previous visit.  RADIOLOGY:  No results found.  EKG:   Orders placed or performed during the hospital encounter of 02/12/17  . EKG 12-Lead  . EKG 12-Lead  . ED EKG within 10 minutes  . ED EKG within 10 minutes      Management plans discussed with the patient, family and they are in agreement.  CODE STATUS:     Code Status Orders        Start     Ordered   02/12/17 2121  Full code  Continuous     02/12/17 2120    Code Status History    Date Active Date Inactive Code Status Order ID Comments User Context   This patient has a current code status but no historical code status.      TOTAL TIME TAKING CARE OF THIS PATIENT: 35 minutes.    Vaughan Basta M.D on 02/17/2017 at 2:41 PM  Between 7am to 6pm - Pager - [phone]  After 6pm go to [email or web address] - password [email or web address] [place] [facility]  Office  [phone]  CC: Primary care physician; Perrin Maltese, MD   Note: This dictation was prepared with Dragon dictation along with smaller phrase technology. Any transcriptional errors that result from this process are unintentional.

## 2017-02-17 NOTE — Clinical Social Work Note (Signed)
CSW presented bed offers to patient and she chose [facility].  CSW contacted [facility] and they can not accept patient till tomorrow.  CSW updated physician and bedside nurse.  Jones Broom. Norval Morton, MSW, [place]  02/17/2017 4:32 PM

## 2017-02-17 NOTE — Progress Notes (Signed)
OT Cancellation Note  Patient Details Name: Katie Baker MRN: [id number] DOB: 05-04-1961   Cancelled Treatment:    Reason Eval/Treat Not Completed: Other (comment). Upon attempt to treat, pt getting hand massage, politely requesting OT come back later. Will re-attempt OT treatment at later date/time as schedule allows.   Jeni Salles, MPH, MS, OTR/L ascom [phone] 02/17/17, 2:31 PM

## 2017-02-17 NOTE — Care Management (Signed)
Provided patient with eliquis coupon to use after discharges from skilled nursing.

## 2017-02-17 NOTE — Plan of Care (Signed)
Problem: Activity: Goal: Risk for activity intolerance will decrease Outcome: Not Progressing Minimal activity noted in room.  OOB to bathroom with 1 assist.  Weakness and unsteady gait noted.

## 2017-02-17 NOTE — Care Management Important Message (Signed)
Important Message  Patient Details  Name: Katie Baker MRN: [id number] Date of Birth: [birth year]   Medicare Important Message Given:  Yes    Katrina Stack, RN 02/17/2017, 11:56 AM

## 2017-02-18 ENCOUNTER — Encounter: Payer: Self-pay | Admitting: Vascular Surgery

## 2017-02-18 LAB — GLUCOSE, CAPILLARY
GLUCOSE-CAPILLARY: 113 mg/dL — AB (ref 65–99)
GLUCOSE-CAPILLARY: 121 mg/dL — AB (ref 65–99)

## 2017-02-18 NOTE — Progress Notes (Signed)
IVs and tele removed from patient. Discharge instructions given to patient , patient verbalized understanding. Report called to [facility] and EMS. EMS arrived to room and will transport patient, no distress at this time.

## 2017-02-18 NOTE — Discharge Summary (Signed)
[name] at [place] NAME: Katie Baker    MR#:  [id number]  DATE OF BIRTH:  18-Jun-1961  DATE OF ADMISSION:  02/12/2017 ADMITTING PHYSICIAN: Vaughan Basta, MD  DATE OF DISCHARGE: 02/18/2017  PRIMARY CARE PHYSICIAN: Perrin Maltese, MD    ADMISSION DIAGNOSIS:  Leg pain [M79.606] Chest pain [R07.9] Other pulmonary embolism without acute cor pulmonale, unspecified chronicity ([place]) [I26.99]  DISCHARGE DIAGNOSIS:  Principal Problem:   Pulmonary embolism ([facility])   DVT  SECONDARY DIAGNOSIS:   Past Medical History:  Diagnosis Date  . Diabetes mellitus, type II ([place])   . Dislocation of hip, congenital   . Fibromyalgia   . Hyperlipidemia   . Hypertension   . Hypothyroidism   . Obesity   . Prediabetes   . Reflux   . Sleep apnea   . Spinal stenosis   . Tendonitis   . Thyroid cancer [facility])     HOSPITAL COURSE:   55y/o F with PMH of hypertension, obesity, h/o thyroid cancer, fibromyalgia, arthritis, spinal stenosis admitted for dyspnea and chest pain and back pain.  #1 Bilateral PE and  Right sided LE DVT- was on IV heparin drip- changed to eliquis Appreciate vascular consult and she is s/p IVC filter now PT consult and SNF placement ECHO with no right heart strain F/u with hematology and vascular as outpatient  #2 DM- SSI and tradjenta  #3 Fibromyalgia- MS contin and prn pain meds Complains of constipation- meds added  #4 Hypothyroidism- on synthroid   DISCHARGE CONDITIONS:   Stable.  CONSULTS OBTAINED:  Treatment Team:  Sela Hua, PA-C  DRUG ALLERGIES:  No Known Allergies  DISCHARGE MEDICATIONS:   Current Discharge Medication List    START taking these medications   Details  !! apixaban (ELIQUIS) 5 MG TABS tablet Take 2 tablets (10 mg total) by mouth 2 (two) times daily. Qty: 24 tablet, Refills: 0    !! apixaban (ELIQUIS) 5 MG TABS tablet Take 1 tablet (5 mg total) by mouth 2  (two) times daily. Start after 6 days. Qty: 60 tablet, Refills: 0    oxyCODONE-acetaminophen (PERCOCET/ROXICET) 5-325 MG tablet Take 1 tablet by mouth every 8 (eight) hours as needed for moderate pain or severe pain. Qty: 20 tablet, Refills: 0    senna-docusate (SENOKOT-S) 8.6-50 MG tablet Take 2 tablets by mouth 2 (two) times daily. Qty: 30 tablet, Refills: 0     !! - Potential duplicate medications found. Please discuss with provider.    CONTINUE these medications which have CHANGED   Details  morphine (MS CONTIN) 15 MG 12 hr tablet Take 1 tablet (15 mg total) by mouth every 12 (twelve) hours. Qty: 30 tablet, Refills: 0      CONTINUE these medications which have NOT CHANGED   Details  diazepam (VALIUM) 5 MG tablet Take 1 tablet by mouth at bedtime as needed. h Refills: 4    dicyclomine (BENTYL) 10 MG capsule Take 10 mg by mouth 4 (four) times daily -  before meals and at bedtime.    gabapentin (NEURONTIN) 300 MG capsule Take 1 cap every 12 hours Refills: 0    levothyroxine (SYNTHROID, LEVOTHROID) 150 MCG tablet Take 1 tablet by mouth daily. Refills: 1    LINZESS 72 MCG capsule Take 1 capsule by mouth daily. Refills: 1    meloxicam (MOBIC) 7.5 MG tablet Take 1 tablet by mouth daily as needed. Refills: 2    pantoprazole (PROTONIX) 40 MG tablet Take  40 mg by mouth daily. Refills: 5    PAZEO 0.7 % SOLN Place 1 drop into both eyes 2 (two) times daily as needed. Refills: 3    ranitidine (ZANTAC) 300 MG tablet Take 1 tablet by mouth daily. Refills: 1    RESTASIS 0.05 % ophthalmic emulsion Place 1 drop into both eyes 2 (two) times daily. Refills: 3    sitaGLIPtin (JANUVIA) 100 MG tablet Take 100 mg by mouth daily.    Blood Glucose Monitoring Suppl (ONE TOUCH ULTRA MINI) W/DEVICE KIT See admin instructions. Refills: 0    fluticasone (FLONASE) 50 MCG/ACT nasal spray Place 1 spray into both nostrils daily as needed. Refills: 12    glucose blood test strip 1 each by  Other route as needed for other. Use as instructed    ibuprofen (ADVIL,MOTRIN) 200 MG tablet Take 800 mg by mouth every 6 (six) hours as needed.     Melatonin 5 MG CAPS Take 1 capsule by mouth at bedtime as needed (sleep).      STOP taking these medications     busPIRone (BUSPAR) 10 MG tablet      lisinopril-hydrochlorothiazide (PRINZIDE,ZESTORETIC) 20-25 MG per tablet      ONETOUCH DELICA LANCETS 81O MISC          DISCHARGE INSTRUCTIONS:    Follow with vascular and hematology clinics.  If you experience worsening of your admission symptoms, develop shortness of breath, life threatening emergency, suicidal or homicidal thoughts you must seek medical attention immediately by calling 911 or calling your MD immediately  if symptoms less severe.  You Must read complete instructions/literature along with all the possible adverse reactions/side effects for all the Medicines you take and that have been prescribed to you. Take any new Medicines after you have completely understood and accept all the possible adverse reactions/side effects.   Please note  You were cared for by a hospitalist during your hospital stay. If you have any questions about your discharge medications or the care you received while you were in the hospital after you are discharged, you can call the unit and asked to speak with the hospitalist on call if the hospitalist that took care of you is not available. Once you are discharged, your primary care physician will handle any further medical issues. Please note that NO REFILLS for any discharge medications will be authorized once you are discharged, as it is imperative that you return to your primary care physician (or establish a relationship with a primary care physician if you do not have one) for your aftercare needs so that they can reassess your need for medications and monitor your lab values.    Today   CHIEF COMPLAINT:   Chief Complaint  Patient presents  with  . Chest Pain    HISTORY OF PRESENT ILLNESS:  Katie Baker  is a 56 y.o. female with a known history of Diabetes, fibromyalgia, hyperlipidemia, hypertension, obesity, spinal stenosis, thyroid cancer- due to other multiple hip surgeries issues and replacements not able to walk steady so using a walker at home and not much mobile. Last 2 weeks she is having shortness of breath and diagnosing a chest images progressively getting worse and now also noted her legs are swollen for the last 3-4 days. SEL 1 mm the rheumatologist, went to the clinic and talked with her doctor about his complaints and they suggested to go to emergency room pediatric ER with CT angiogram she was noted to have bilateral pulmonary emboli with large  clot burden, so advised to admit to the hospitalist team.   VITAL SIGNS:  Blood pressure (!) 122/46, pulse 73, temperature 98.6 F (37 C), temperature source Oral, resp. rate 18, height 4' 8"  (1.422 m), weight 81.2 kg (179 lb), SpO2 95 %.  I/O:    Intake/Output Summary (Last 24 hours) at 02/18/17 0857 Last data filed at 02/18/17 0247  Gross per 24 hour  Intake              480 ml  Output             1601 ml  Net            -1121 ml    PHYSICAL EXAMINATION:   GENERAL:  56 y.o.-year-old obese patient lying in the bed with no acute distress.  EYES: Pupils equal, round, reactive to light and accommodation. No scleral icterus. Extraocular muscles intact.  HEENT: Head atraumatic, normocephalic. Oropharynx and nasopharynx clear.  NECK:  Supple, no jugular venous distention. No thyroid enlargement, no tenderness.  LUNGS: Normal breath sounds bilaterally, no wheezing, rales,rhonchi or crepitation. No use of accessory muscles of respiration. Decreased bibasilar breath sounds CARDIOVASCULAR: S1, S2 normal. No murmurs, rubs, or gallops.  ABDOMEN: Soft, nontender, nondistended. Bowel sounds present. No organomegaly or mass.  EXTREMITIES: No  cyanosis, or clubbing. 1+  bilateral pedal edema present NEUROLOGIC: Cranial nerves II through XII are intact. Muscle strength 5/5 in all extremities. Sensation intact. Gait not checked. Global weakness noted PSYCHIATRIC: The patient is alert and oriented x 3.  SKIN: No obvious rash, lesion, or ulcer.   DATA REVIEW:   CBC  Recent Labs Lab 02/15/17 0550  WBC 8.1  HGB 13.4  HCT 39.5  PLT 217    Chemistries   Recent Labs Lab 02/16/17 0515  NA 140  K 3.5  CL 106  CO2 29  GLUCOSE 123*  BUN 10  CREATININE 0.63  CALCIUM 8.3*    Cardiac Enzymes  Recent Labs Lab 02/12/17 1230  [place] <0.03    Microbiology Results  No results found for this or any previous visit.  RADIOLOGY:  No results found.  EKG:   Orders placed or performed during the hospital encounter of 02/12/17  . EKG 12-Lead  . EKG 12-Lead  . ED EKG within 10 minutes  . ED EKG within 10 minutes      Management plans discussed with the patient, family and they are in agreement.  CODE STATUS:     Code Status Orders        Start     Ordered   02/12/17 2121  Full code  Continuous     02/12/17 2120    Code Status History    Date Active Date Inactive Code Status Order ID Comments User Context   This patient has a current code status but no historical code status.      TOTAL TIME TAKING CARE OF THIS PATIENT: 35 minutes.    Vaughan Basta M.D on 02/18/2017 at 8:57 AM  Between 7am to 6pm - Pager - [phone]  After 6pm go to [email or web address] - password [email or web address] [place] [facility]  Office  [phone]  CC: Primary care physician; Perrin Maltese, MD   Note: This dictation was prepared with Dragon dictation along with smaller phrase technology. Any transcriptional errors that result from this process are unintentional.

## 2017-02-18 NOTE — Clinical Social Work Note (Signed)
Patient to be d/c'ed today to [facility].  Patient and family agreeable to plans will transport via ems RN to call report [phone] room [place], packet by patient's chart.  Evette Cristal, MSW, [place]

## 2017-03-17 ENCOUNTER — Encounter (INDEPENDENT_AMBULATORY_CARE_PROVIDER_SITE_OTHER): Payer: Self-pay | Admitting: Vascular Surgery

## 2017-03-17 ENCOUNTER — Ambulatory Visit (INDEPENDENT_AMBULATORY_CARE_PROVIDER_SITE_OTHER): Payer: Medicare Other | Admitting: Vascular Surgery

## 2017-03-17 VITALS — BP 171/79 | HR 82 | Ht <= 58 in | Wt 186.0 lb

## 2017-03-17 DIAGNOSIS — E119 Type 2 diabetes mellitus without complications: Secondary | ICD-10-CM | POA: Diagnosis not present

## 2017-03-17 DIAGNOSIS — I10 Essential (primary) hypertension: Secondary | ICD-10-CM | POA: Diagnosis not present

## 2017-03-17 DIAGNOSIS — E782 Mixed hyperlipidemia: Secondary | ICD-10-CM

## 2017-03-17 DIAGNOSIS — I2602 Saddle embolus of pulmonary artery with acute cor pulmonale: Secondary | ICD-10-CM | POA: Diagnosis not present

## 2017-03-17 NOTE — Progress Notes (Signed)
MRN : [id number]  Katie Baker is a 56 y.o. (1961/08/19) female who presents with chief complaint of  Chief Complaint  Patient presents with  . Follow-up  .  History of Present Illness:  The patient presents to the office for evaluation of PE.  PE was identified at [facility] by Duplex CT angiogram.  The initial symptoms were shortness of breath and chest pain.  She also noted pain and swelling in the right lower extremity.  PROCEDURE 02/14/2017: 1.   Ultrasound guidance for vascular access to the right common femoral vein vein 2.   Catheter placement into the inferior vena cava 3.   Inferior venacavogram 4.   Placement of a [place] IVC filter  The patient notes the leg continues to be very painful with dependency and swells quite a bite.  Symptoms are much better with elevation.  The patient notes minimal edema in the morning which steadily worsens throughout the day.    The patient has not been using compression therapy at this point.  No SOB or pleuritic chest pains.  No cough or hemoptysis.  No blood per rectum or blood in any sputum.  No excessive bruising per the patient.       Current Meds  Medication Sig  . apixaban (ELIQUIS) 5 MG TABS tablet Take 1 tablet (5 mg total) by mouth 2 (two) times daily. Start after 6 days.  . Blood Glucose Monitoring Suppl (ONE TOUCH ULTRA MINI) W/DEVICE KIT See admin instructions.  . diazepam (VALIUM) 5 MG tablet Take 1 tablet by mouth at bedtime as needed. h  . dicyclomine (BENTYL) 10 MG capsule Take 10 mg by mouth 4 (four) times daily -  before meals and at bedtime.  . fluticasone (FLONASE) 50 MCG/ACT nasal spray Place 1 spray into both nostrils daily as needed.  . gabapentin (NEURONTIN) 300 MG capsule Take 1 cap every 12 hours  . glucose blood test strip 1 each by Other route as needed for other. Use as instructed  . ibuprofen (ADVIL,MOTRIN) 200 MG tablet Take 800 mg by mouth every 6 (six) hours as needed.   Marland Kitchen levothyroxine (SYNTHROID,  LEVOTHROID) 150 MCG tablet Take 1 tablet by mouth daily.  Marland Kitchen LINZESS 72 MCG capsule Take 1 capsule by mouth daily.  . Melatonin 5 MG CAPS Take 1 capsule by mouth at bedtime as needed (sleep).  . meloxicam (MOBIC) 7.5 MG tablet Take 1 tablet by mouth daily as needed.  Marland Kitchen morphine (MS CONTIN) 15 MG 12 hr tablet Take 1 tablet (15 mg total) by mouth every 12 (twelve) hours.  Marland Kitchen oxyCODONE-acetaminophen (PERCOCET/ROXICET) 5-325 MG tablet Take 1 tablet by mouth every 8 (eight) hours as needed for moderate pain or severe pain.  . pantoprazole (PROTONIX) 40 MG tablet Take 40 mg by mouth daily.  Marland Kitchen PAZEO 0.7 % SOLN Place 1 drop into both eyes 2 (two) times daily as needed.  . ranitidine (ZANTAC) 300 MG tablet Take 1 tablet by mouth daily.  . RESTASIS 0.05 % ophthalmic emulsion Place 1 drop into both eyes 2 (two) times daily.  Marland Kitchen senna-docusate (SENOKOT-S) 8.6-50 MG tablet Take 2 tablets by mouth 2 (two) times daily.  . sitaGLIPtin (JANUVIA) 100 MG tablet Take 100 mg by mouth daily.    Past Medical History:  Diagnosis Date  . Diabetes mellitus, type II ([place])   . Dislocation of hip, congenital   . Fibromyalgia   . Hyperlipidemia   . Hypertension   . Hypothyroidism   .  Obesity   . Prediabetes   . Reflux   . Sleep apnea   . Spinal stenosis   . Tendonitis   . Thyroid cancer [facility])     Past Surgical History:  Procedure Laterality Date  . COLONOSCOPY WITH PROPOFOL N/A 02/09/2015   Procedure: COLONOSCOPY WITH PROPOFOL;  Surgeon: [name], MD;  Location: [facility] ENDOSCOPY;  Service: Endoscopy;  Laterality: N/A;  . ESOPHAGOGASTRODUODENOSCOPY N/A 02/09/2015   Procedure: ESOPHAGOGASTRODUODENOSCOPY (EGD);  Surgeon: [name], MD;  Location: [facility] ENDOSCOPY;  Service: Endoscopy;  Laterality: N/A;  . HIP SURGERY    . IVC FILTER INSERTION N/A 02/14/2017   Procedure: IVC Filter Insertion;  Surgeon: Katha Cabal, MD;  Location: [place] CV LAB;  Service: Cardiovascular;  Laterality: N/A;    . THYROID SURGERY      Social History Social History  Substance Use Topics  . Smoking status: Never Smoker  . Smokeless tobacco: Never Used  . Alcohol use No    Family History Family History  Problem Relation Age of Onset  . Heart disease Mother   . Hypertension Mother   . Alzheimer's disease Mother   . Diabetes Mother   . Stroke Mother   . Anxiety disorder Mother   . Diabetes Father   . Alcohol abuse Father   . Bladder Cancer Sister   . Thyroid disease Brother   . Diabetes Daughter   . Diabetes Maternal Aunt     No Known Allergies   REVIEW OF SYSTEMS (Negative unless checked)  Constitutional: _0 Weight loss  _1 Fever  _2 Chills Cardiac: _3 Chest pain   _4 Chest pressure   _5 Palpitations   _6 Shortness of breath when laying flat   _7 Shortness of breath with exertion. Vascular:  _8 Pain in legs with walking   _9 Pain in legs with standing  _10 History of DVT   _11 Phlebitis   _12 Swelling in legs   _13 Varicose veins   _14 Non-healing ulcers Pulmonary:   _15 Uses home oxygen   _16 Productive cough   _17 Hemoptysis   _18 Wheeze  _19 COPD   _20 Asthma Neurologic:  _21 Dizziness   _22 Seizures   _23 History of stroke   _24 History of TIA  _25 Aphasia   _26 Vissual changes   _27 Weakness or numbness in arm   _28 Weakness or numbness in leg Musculoskeletal:   _29 Joint swelling   _30 Joint pain   _31 Low back pain Hematologic:  _32 Easy bruising  _33 Easy bleeding   _34 Hypercoagulable state   _35 Anemic Gastrointestinal:  _36 Diarrhea   _37 Vomiting  _38 Gastroesophageal reflux/heartburn   _39 Difficulty swallowing. Genitourinary:  _40 Chronic kidney disease   _41 Difficult urination  _42 Frequent urination   _43 Blood in urine Skin:  _44 Rashes   _45 Ulcers  Psychological:  _46 History of anxiety   _47  History of major depression.  Physical Examination  Vitals:   03/17/17 1254  BP: (!) 171/79  Pulse: 82  Weight: 186 lb (84.4 kg)  Height: _48  (1.422 m)   Body mass index is 41.7 kg/m. Gen: WD/WN, NAD Head: [place]/AT, No temporalis wasting.   Ear/Nose/Throat: Hearing grossly intact, nares w/o erythema or drainage Eyes: PER, EOMI, sclera nonicteric.  Neck: Supple, no large masses.   Pulmonary:  Good air movement, no audible wheezing bilaterally, no use of accessory muscles.  Cardiac: RRR, no JVD Vascular: Moderate venous stasis changes to the legs bilaterally.  3+ soft pitting edema Vessel Right Left  Radial Palpable Palpable  PT Palpable Palpable  DP Palpable Palpable  Gastrointestinal: Non-distended. No guarding/no peritoneal signs.  Musculoskeletal: M/S 5/5 throughout.  No deformity or atrophy.  Neurologic: CN 2-12  intact. Symmetrical.  Speech is fluent. Motor exam as listed above. Psychiatric: Judgment intact, Mood & affect appropriate for pt's clinical situation. Dermatologic: No rashes or ulcers noted.  No changes consistent with cellulitis. Lymph : No lichenification or skin changes of chronic lymphedema.  CBC Lab Results  Component Value Date   WBC 8.1 02/15/2017   HGB 13.4 02/15/2017   HCT 39.5 02/15/2017   MCV 87.9 02/15/2017   PLT 217 02/15/2017    BMET    Component Value Date/Time   NA 140 02/16/2017 0515   NA 141 10/01/2011 0721   K 3.5 02/16/2017 0515   K 3.6 10/01/2011 0721   CL 106 02/16/2017 0515   CL 104 10/01/2011 0721   CO2 29 02/16/2017 0515   CO2 28 10/01/2011 0721   GLUCOSE 123 (H) 02/16/2017 0515   GLUCOSE 111 (H) 10/01/2011 0721   BUN 10 02/16/2017 0515   BUN 10 10/01/2011 0721   CREATININE 0.63 02/16/2017 0515   CREATININE 0.51 (L) 10/01/2011 0721   CALCIUM 8.3 (L) 02/16/2017 0515   CALCIUM 9.1 10/01/2011 0721   GFRNONAA >60 02/16/2017 0515   GFRNONAA >60 10/01/2011 0721   GFRAA >60 02/16/2017 0515   GFRAA >60 10/01/2011 0721   CrCl cannot be calculated (Patient's most recent lab result is older than the maximum 21 days allowed.).  COAG Lab Results  Component Value Date   INR 0.96 02/12/2017    Radiology No results found.  Assessment/Plan 1. Acute saddle pulmonary  embolism with acute cor pulmonale (HCC) Recommend:   No surgery or intervention at this point in time.  IVC filter is present.  Patient's duplex ultrasound of the venous system shows DVT from the popliteal to the femoral veins.  The patient is initiated on anticoagulation   Elevation was stressed, use of a recliner was discussed.  I have had a long discussion with the patient regarding DVT and post phlebitic changes such as swelling and why it  causes symptoms such as pain.  The patient will wear graduated compression stockings class 1 (20-30 mmHg), beginning after three full days of anticoagulation, on a daily basis a prescription was given. The patient will  beginning wearing the stockings first thing in the morning and removing them in the evening. The patient is instructed specifically not to sleep in the stockings.  In addition, behavioral modification including elevation during the day and avoidance of prolonged dependency will be initiated.    The patient will continue anticoagulation for now as there have not been any problems or complications at this point.   She will follow up with a duplex ultrasound of the lower extremities in approximate 5-6 months. Removal of the IVC filter will be discussed at that time.   2. Mixed hyperlipidemia Continue statin as ordered and reviewed, no changes at this time   3. Essential hypertension Continue antihypertensive medications as already ordered, these medications have been reviewed and there are no changes at this time.     Hortencia Pilar, MD  03/17/2017 1:45 PM

## 2017-03-19 DIAGNOSIS — E119 Type 2 diabetes mellitus without complications: Secondary | ICD-10-CM | POA: Insufficient documentation

## 2017-04-03 ENCOUNTER — Other Ambulatory Visit: Payer: Self-pay | Admitting: Nurse Practitioner

## 2017-04-03 DIAGNOSIS — Z1231 Encounter for screening mammogram for malignant neoplasm of breast: Secondary | ICD-10-CM

## 2017-04-21 ENCOUNTER — Inpatient Hospital Stay: Payer: Medicare Other | Attending: Internal Medicine | Admitting: Internal Medicine

## 2017-04-21 ENCOUNTER — Encounter: Payer: Self-pay | Admitting: Internal Medicine

## 2017-04-21 VITALS — BP 118/79 | HR 76 | Temp 97.4°F | Resp 18 | Ht <= 58 in | Wt 178.0 lb

## 2017-04-21 DIAGNOSIS — Z85850 Personal history of malignant neoplasm of thyroid: Secondary | ICD-10-CM | POA: Diagnosis not present

## 2017-04-21 DIAGNOSIS — E669 Obesity, unspecified: Secondary | ICD-10-CM | POA: Insufficient documentation

## 2017-04-21 DIAGNOSIS — Z79899 Other long term (current) drug therapy: Secondary | ICD-10-CM | POA: Insufficient documentation

## 2017-04-21 DIAGNOSIS — G4730 Sleep apnea, unspecified: Secondary | ICD-10-CM

## 2017-04-21 DIAGNOSIS — Z7901 Long term (current) use of anticoagulants: Secondary | ICD-10-CM | POA: Diagnosis not present

## 2017-04-21 DIAGNOSIS — E890 Postprocedural hypothyroidism: Secondary | ICD-10-CM | POA: Insufficient documentation

## 2017-04-21 DIAGNOSIS — I2699 Other pulmonary embolism without acute cor pulmonale: Secondary | ICD-10-CM | POA: Insufficient documentation

## 2017-04-21 DIAGNOSIS — G8929 Other chronic pain: Secondary | ICD-10-CM

## 2017-04-21 DIAGNOSIS — K219 Gastro-esophageal reflux disease without esophagitis: Secondary | ICD-10-CM | POA: Insufficient documentation

## 2017-04-21 DIAGNOSIS — M25552 Pain in left hip: Secondary | ICD-10-CM | POA: Insufficient documentation

## 2017-04-21 DIAGNOSIS — E785 Hyperlipidemia, unspecified: Secondary | ICD-10-CM | POA: Diagnosis not present

## 2017-04-21 DIAGNOSIS — I82401 Acute embolism and thrombosis of unspecified deep veins of right lower extremity: Secondary | ICD-10-CM | POA: Insufficient documentation

## 2017-04-21 DIAGNOSIS — M797 Fibromyalgia: Secondary | ICD-10-CM | POA: Diagnosis not present

## 2017-04-21 DIAGNOSIS — I10 Essential (primary) hypertension: Secondary | ICD-10-CM | POA: Diagnosis not present

## 2017-04-21 DIAGNOSIS — E119 Type 2 diabetes mellitus without complications: Secondary | ICD-10-CM

## 2017-04-21 DIAGNOSIS — M129 Arthropathy, unspecified: Secondary | ICD-10-CM | POA: Insufficient documentation

## 2017-04-21 DIAGNOSIS — M25551 Pain in right hip: Secondary | ICD-10-CM | POA: Insufficient documentation

## 2017-04-21 NOTE — Assessment & Plan Note (Addendum)
#   BIL PE-moderate to large clot burden without right heart strain. Patient currently on Eliquis tolerating it well. Patient also status post IVC filter. Patient is recommended at minimum of one year of anticoagulation. Hypercoagulable workup could be considered in future- at this time this would not affect the management.  # Long discussion the patient regarding the risk factors- which seems to be her immobility. And its very unlikely that this risk factor would be mitigated in future. Patient will likely need long-term anticoagulant lesion. Discussed the potential risk of anticoagulation with Eliquis including but not limited to bleeding which could be difficult to reverse;  And also avoiding falls on anticoagulation. Patient agrees.  # Right lower extremity postphlebitic syndrome- recommend stockings.  # follow up in 3 months/labs.   Thank you for allowing me to participate in the care of your pleasant patient. Please do not hesitate to contact me with questions or concerns in the interim.  CC: Dr.Khan.

## 2017-04-21 NOTE — Progress Notes (Signed)
[facility] NOTE  Patient Care Team: Perrin Maltese, MD as PCP - General (Internal Medicine) Perrin Maltese, MD as Referring Physician (Internal Medicine) Robert Bellow, MD (General Surgery)  CHIEF COMPLAINTS/PURPOSE OF CONSULTATION:  Bilateral PE & Right LE DVT   # June MID 2018 Bil PE mod-large/Right LE DVT- [immobility sec to chronic arthritis] s/p IVC filter [Dr.Schneir] on Eliquis.   # DEC 2011- Thyroid cancer s/p sub total thyroidectomy  [Dr.Solum; s/p  RAI; [facility] ].   # Fibromyalgias/ Bil Hip pain s/p Revisions [congenital hip ]; Rheumatologist [Dr.Bach]; Bell's palsy- right side ; DM-   No history exists.     HISTORY OF PRESENTING ILLNESS:  Katie Baker [age] y.o.  female recently diagnosed bilateral PE and right lower extremity DVT has been deferred was for further evaluation and recommendations.  Patient's mobility is chronically restricted because of chronic arthritic problems. Patient has chronic bilateral hip pain on narcotics. However prior to presentation to the hospital- she noted to have worsening pain in the right lower extremity compared to the left. And also had worsening shortness breath. CTA showed- bilateral moderate to large clot burden; with no evidence of any right heart strain. 2 echo did not show any right heart strain. Patient was started on anticoagulantion with IV heparin. Patient also had IVC filter placed. She was discharged on Eliquis.  Patient denies any chest pain or shortness of breath at this time. She continues to have mild swelling of the right lower extremity compared to the left. Continues to chronic pain. Denies any blood in stools black stools.  Patient does not smoke. No alcohol. No drugs. No prior history of blood clots. No prior history of anticoagulation. Denies any family history of blood clots. Patient denies any antecedent risk factors for blood clots.  ROS: A complete 10 point review of system is done which is  negative except mentioned above in history of present illness  MEDICAL HISTORY:  Past Medical History:  Diagnosis Date  . Deep vein thrombosis (DVT) ([place])   . Diabetes mellitus, type II ([place])   . Dislocation of hip, congenital   . Fibromyalgia   . Hyperlipidemia   . Hypertension   . Hypothyroidism   . Obesity   . Prediabetes   . Pulmonary embolism ([place]) 01/2017  . Reflux   . Sleep apnea   . Spinal stenosis   . Tendonitis   . Thyroid cancer [facility])     SURGICAL HISTORY: Past Surgical History:  Procedure Laterality Date  . COLONOSCOPY WITH PROPOFOL N/A 02/09/2015   Procedure: COLONOSCOPY WITH PROPOFOL;  Surgeon: [name], MD;  Location: [facility] ENDOSCOPY;  Service: Endoscopy;  Laterality: N/A;  . ESOPHAGOGASTRODUODENOSCOPY N/A 02/09/2015   Procedure: ESOPHAGOGASTRODUODENOSCOPY (EGD);  Surgeon: [name], MD;  Location: [facility] ENDOSCOPY;  Service: Endoscopy;  Laterality: N/A;  . HIP SURGERY    . IVC FILTER INSERTION N/A 02/14/2017   Procedure: IVC Filter Insertion;  Surgeon: Katha Cabal, MD;  Location: [place] CV LAB;  Service: Cardiovascular;  Laterality: N/A;  . THYROID SURGERY      SOCIAL HISTORY: Social History   Social History  . Marital status: Divorced    Spouse name: N/A  . Number of children: N/A  . Years of education: N/A   Occupational History  . Not on file.   Social History Main Topics  . Smoking status: Never Smoker  . Smokeless tobacco: Never Used  . Alcohol use No  .  Drug use: No  . Sexual activity: No   Other Topics Concern  . Not on file   Social History Narrative  . No narrative on file    FAMILY HISTORY: Family History  Problem Relation Age of Onset  . Heart disease Mother   . Hypertension Mother   . Alzheimer's disease Mother   . Diabetes Mother   . Stroke Mother   . Anxiety disorder Mother   . Diabetes Father   . Alcohol abuse Father   . Bladder Cancer Sister   . Thyroid disease Brother   . Diabetes  Daughter   . Diabetes Maternal Aunt     ALLERGIES:  has No Known Allergies.  MEDICATIONS:  Current Outpatient Prescriptions  Medication Sig Dispense Refill  . colchicine 0.6 MG tablet Take 2 tablets (1.24m) by mouth at first sign of gout flare followed by 1 tablet (0.642m after 1 hour) Next day 1 tab daily for 5 days    . diazepam (VALIUM) 5 MG tablet TK 1 T PO QHS PRN FOR MUSCLE SPASMS    . allopurinol (ZYLOPRIM) 100 MG tablet Take by mouth.    . Marland Kitchenpixaban (ELIQUIS) 5 MG TABS tablet Take 2 tablets (10 mg total) by mouth 2 (two) times daily. 24 tablet 0  . apixaban (ELIQUIS) 5 MG TABS tablet Take 1 tablet (5 mg total) by mouth 2 (two) times daily. Start after 6 days. 60 tablet 0  . Blood Glucose Monitoring Suppl (ONE TOUCH ULTRA MINI) W/DEVICE KIT See admin instructions.  0  . diazepam (VALIUM) 5 MG tablet Take 1 tablet by mouth at bedtime as needed. h  4  . dicyclomine (BENTYL) 10 MG capsule Take 10 mg by mouth 4 (four) times daily -  before meals and at bedtime.    . fluticasone (FLONASE) 50 MCG/ACT nasal spray Place 1 spray into both nostrils daily as needed.  12  . gabapentin (NEURONTIN) 300 MG capsule Take 1 cap every 12 hours  0  . glucose blood test strip 1 each by Other route as needed for other. Use as instructed    . ibuprofen (ADVIL,MOTRIN) 200 MG tablet Take 800 mg by mouth every 6 (six) hours as needed.     . Marland Kitchenevothyroxine (SYNTHROID, LEVOTHROID) 150 MCG tablet Take 1 tablet by mouth daily.  1  . LINZESS 72 MCG capsule Take 1 capsule by mouth daily.  1  . Melatonin 5 MG CAPS Take 1 capsule by mouth at bedtime as needed (sleep).    . meloxicam (MOBIC) 7.5 MG tablet Take 1 tablet by mouth daily as needed.  2  . morphine (MS CONTIN) 15 MG 12 hr tablet Take 1 tablet (15 mg total) by mouth every 12 (twelve) hours. 30 tablet 0  . oxyCODONE-acetaminophen (PERCOCET/ROXICET) 5-325 MG tablet Take 1 tablet by mouth every 8 (eight) hours as needed for moderate pain or severe pain. 20 tablet  0  . pantoprazole (PROTONIX) 40 MG tablet Take 40 mg by mouth daily.  5  . PAZEO 0.7 % SOLN Place 1 drop into both eyes 2 (two) times daily as needed.  3  . ranitidine (ZANTAC) 300 MG tablet Take 1 tablet by mouth daily.  1  . RESTASIS 0.05 % ophthalmic emulsion Place 1 drop into both eyes 2 (two) times daily.  3  . rosuvastatin (CRESTOR) 40 MG tablet Take by mouth.    . senna-docusate (SENOKOT-S) 8.6-50 MG tablet Take 2 tablets by mouth 2 (two) times daily. (Patient not taking:  Reported on 04/21/2017) 30 tablet 0  . sitaGLIPtin (JANUVIA) 100 MG tablet Take 100 mg by mouth daily.     No current facility-administered medications for this visit.       Marland Kitchen  PHYSICAL EXAMINATION:   Vitals:   04/21/17 1121  BP: 118/79  Pulse: 76  Resp: 18  Temp: (!) 97.4 F (36.3 C)   Filed Weights   04/21/17 1121  Weight: 178 lb (80.7 kg)    GENERAL: Well-nourished well-developed; Alert, no distress and comfortable.   Accompanied by daughter. She is in a wheelchair. EYES: no pallor or icterus OROPHARYNX: no thrush or ulceration; good dentition  NECK: supple, no masses felt LYMPH:  no palpable lymphadenopathy in the cervical, axillary or inguinal regions LUNGS: clear to auscultation and  No wheeze or crackles HEART/CVS: regular rate & rhythm and no murmurs; No lower extremity edema ABDOMEN: abdomen soft, non-tender and normal bowel sounds Musculoskeletal:no cyanosis of digits and no clubbing  PSYCH: alert & oriented x 3 with fluent speech NEURO: no focal motor/sensory deficits SKIN:  no rashes or significant lesions  LABORATORY DATA:  Baker have reviewed the data as listed Lab Results  Component Value Date   WBC 8.1 02/15/2017   HGB 13.4 02/15/2017   HCT 39.5 02/15/2017   MCV 87.9 02/15/2017   PLT 217 02/15/2017    Recent Labs  02/13/17 0042 02/14/17 0448 02/16/17 0515  NA 139 140 140  K 2.8* 3.5 3.5  CL 105 106 106  CO2 27 27 29   GLUCOSE 159* 133* 123*  BUN 5* 8 10  CREATININE  0.57 0.51 0.63  CALCIUM 8.4* 8.3* 8.3*  GFRNONAA >60 >60 >60  GFRAA >60 >60 >60    RADIOGRAPHIC STUDIES: Baker have personally reviewed the radiological images as listed and agreed with the findings in the report. No results found.  ASSESSMENT & PLAN:   Pulmonary embolism, bilateral ([place]) # BIL PE-moderate to large clot burden without right heart strain. Patient currently on Eliquis tolerating it well. Patient also status post IVC filter. Patient is recommended at minimum of one year of anticoagulation. Hypercoagulable workup could be considered in future- at this time this would not affect the management.  # Long discussion the patient regarding the risk factors- which seems to be her immobility. And its very unlikely that this risk factor would be mitigated in future. Patient will likely need long-term anticoagulant lesion. Discussed the potential risk of anticoagulation with Eliquis including but not limited to bleeding which could be difficult to reverse;  And also avoiding falls on anticoagulation. Patient agrees.  # Right lower extremity postphlebitic syndrome- recommend stockings.  # follow up in 3 months/labs.   Thank you for allowing me to participate in the care of your pleasant patient. Please do not hesitate to contact me with questions or concerns in the interim.  CC: Dr.Khan.   All questions were answered. The patient knows to call the clinic with any problems, questions or concerns.    Katie Sickle, MD 04/21/2017 1:35 PM

## 2017-04-21 NOTE — Progress Notes (Signed)
Patient was newly diagnosed with PE and DVT in 01/2017.  Has been seen by vascular and a IVC filter placed.

## 2017-04-30 IMAGING — US US RENAL
1 series · 14 of 25 positions shown · non-contrast
Comparison: None in PACs

CLINICAL DATA: Bilateral renal college for many years greater on
the left than on the right

EXAM:
RENAL / URINARY TRACT ULTRASOUND COMPLETE

[Series 1: us renal · 0.23mm/px · 14 of 36 slices shown]
[im 1/36]
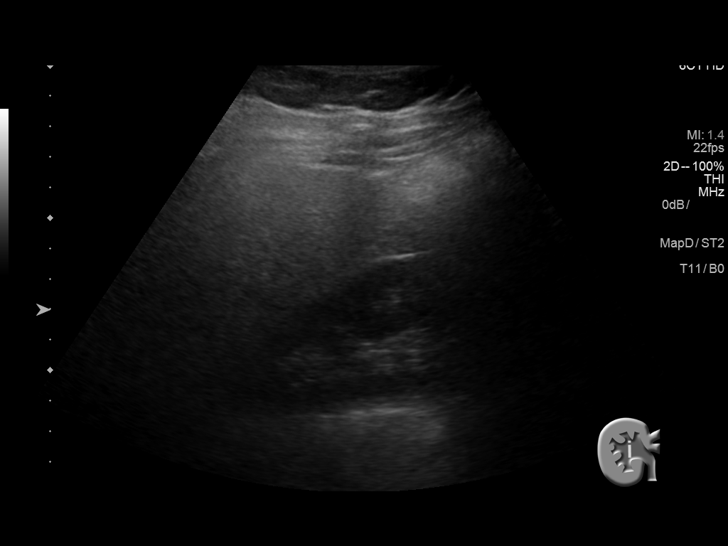
[im 3/36]
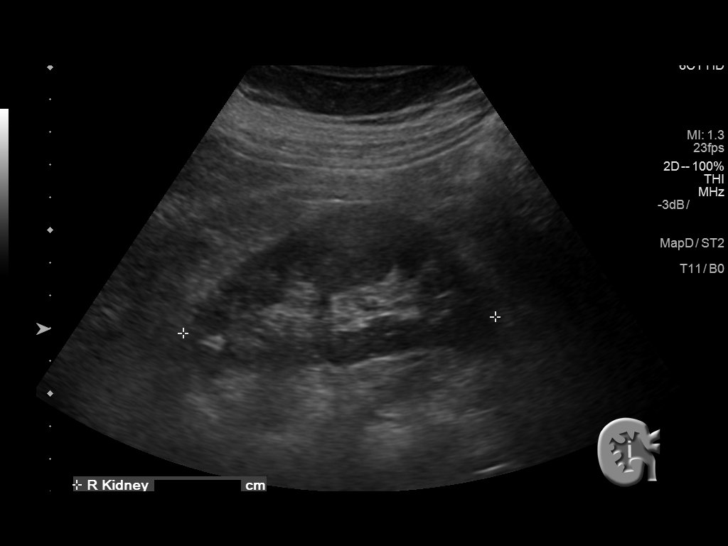
[im 6/36]
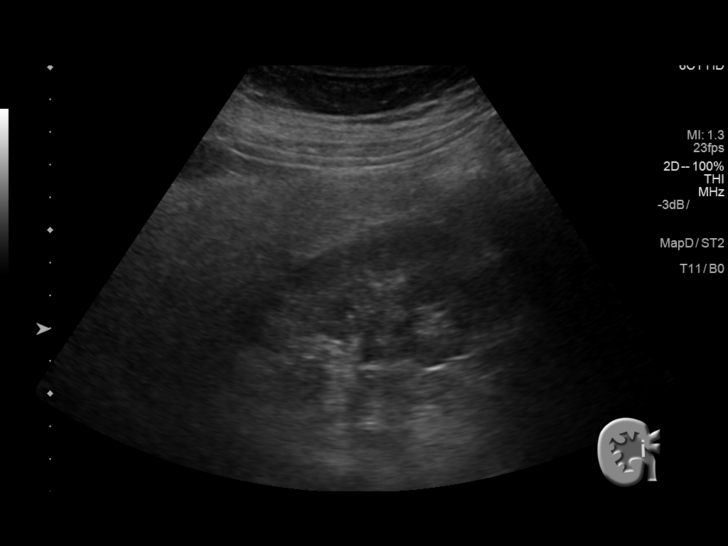
[im 9/36]
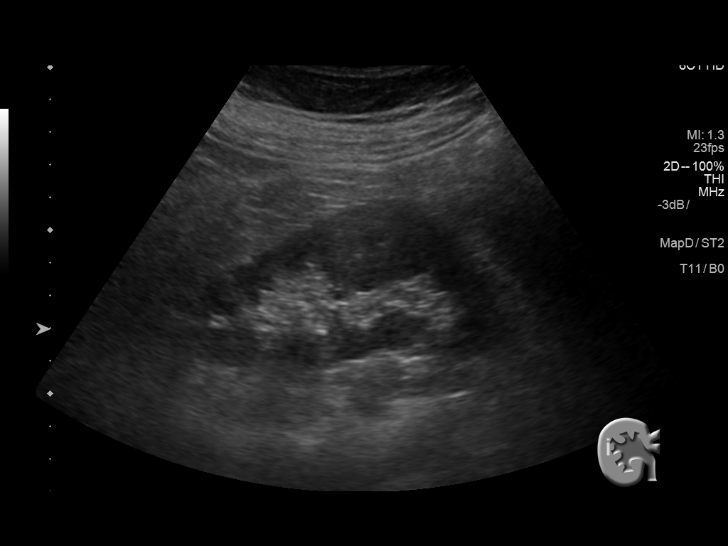
[im 12/36]
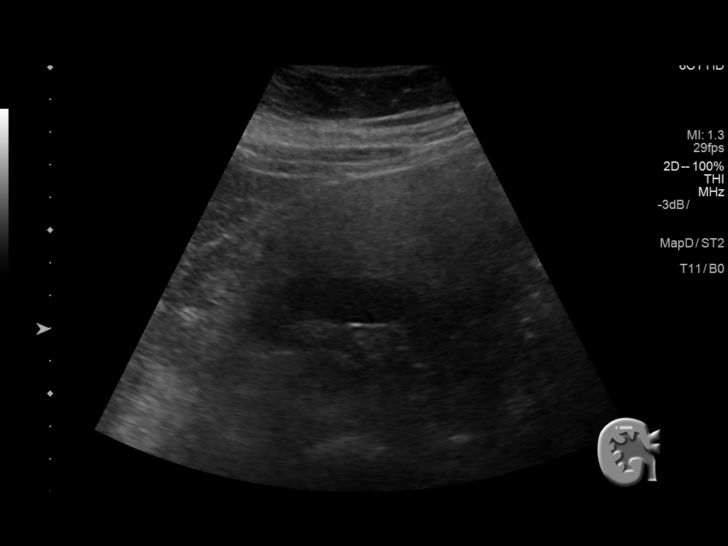
[im 14/36]
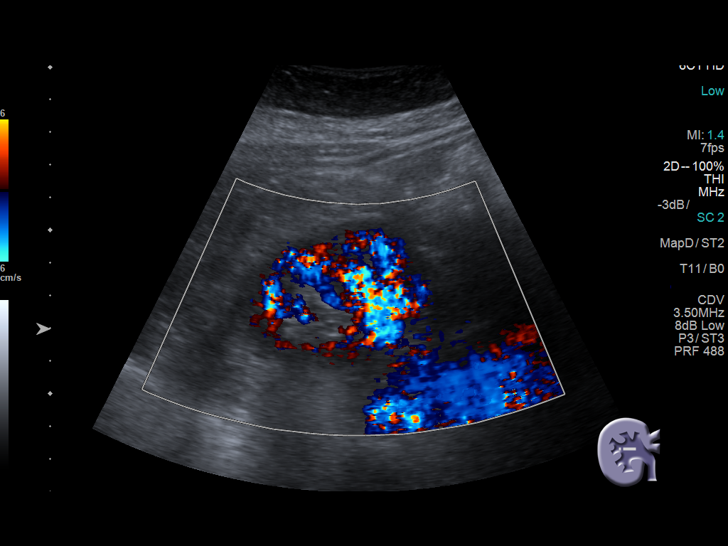
[im 17/36]
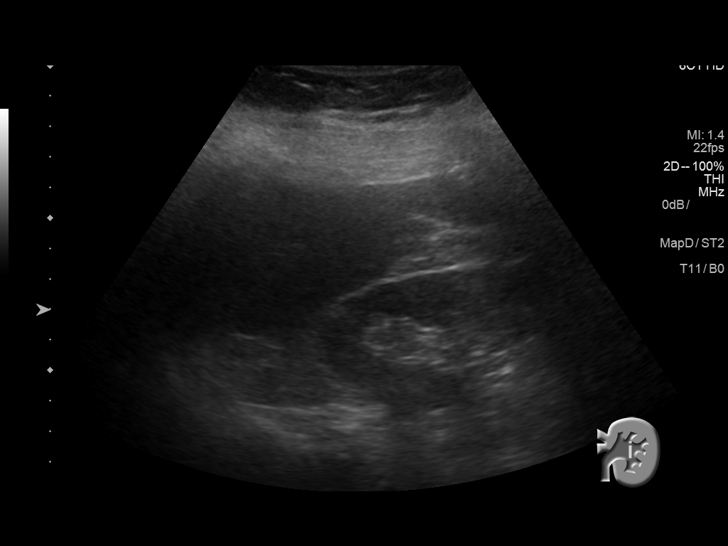
[im 19/36]
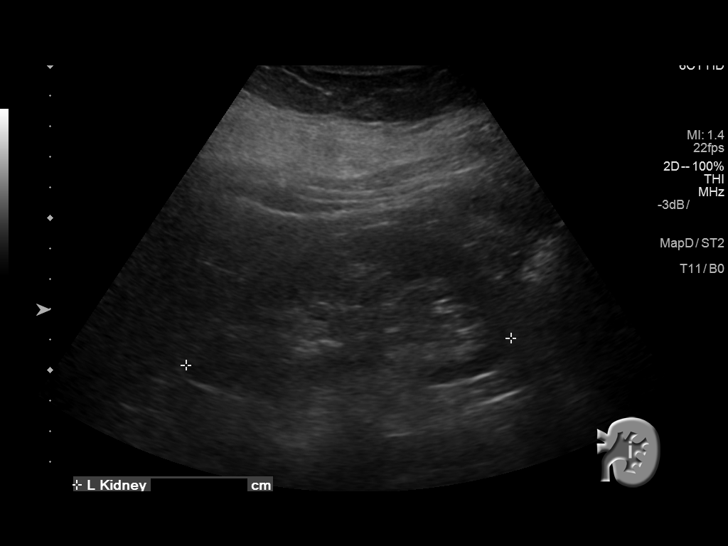
[im 22/36]
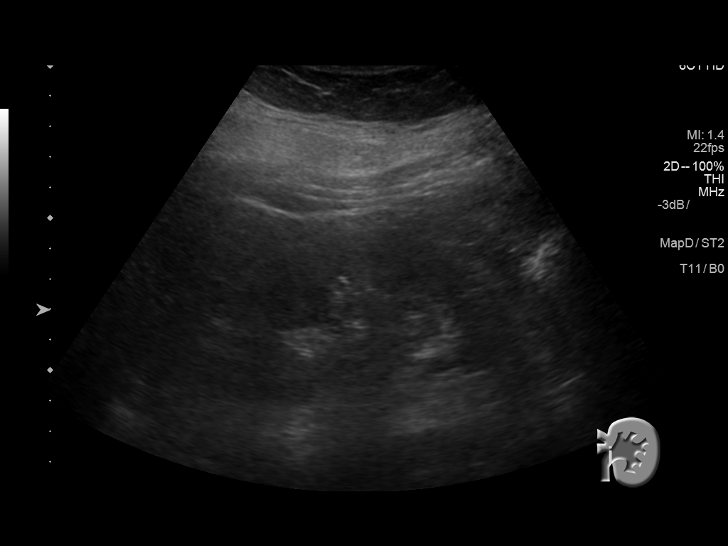
[im 24/36]
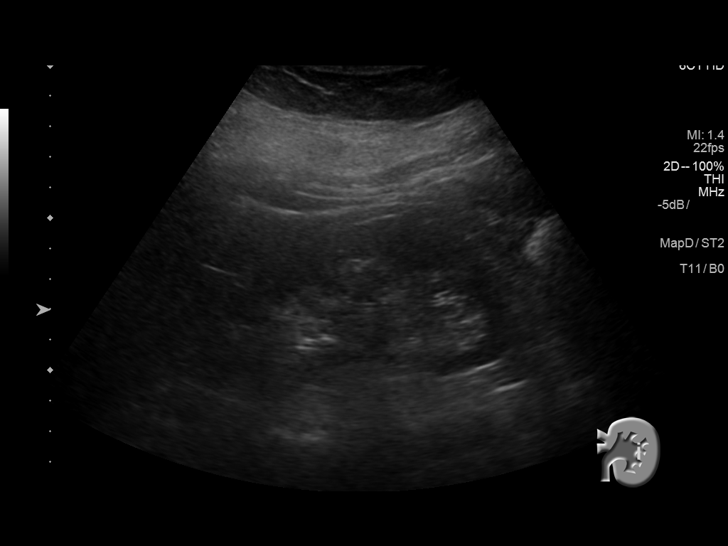
[im 27/36]
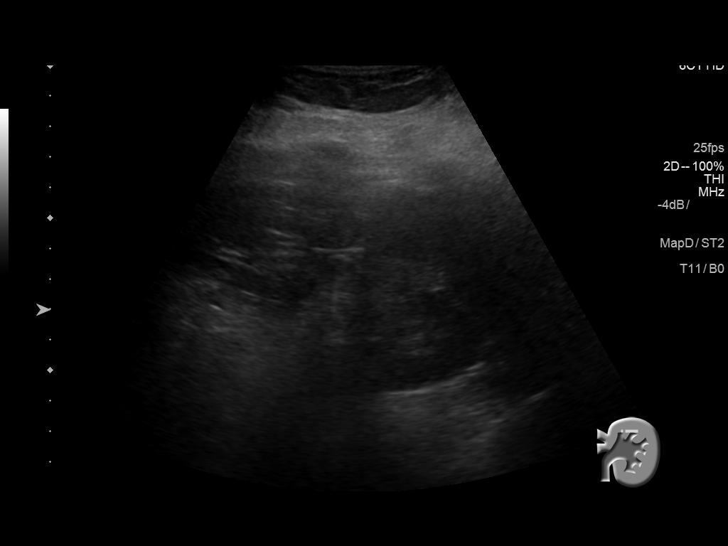
[im 30/36]
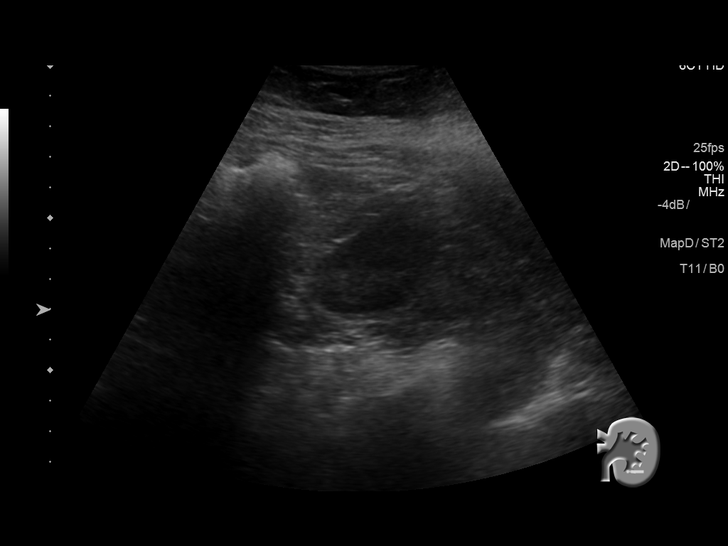
[im 33/36]
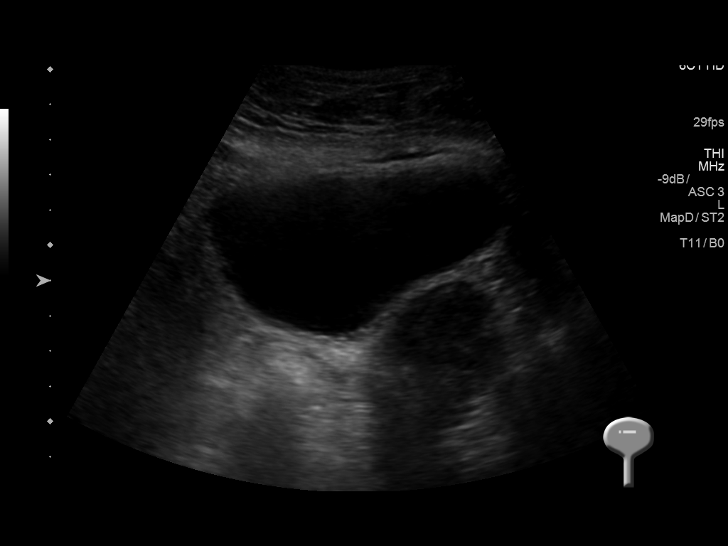
[im 36/36]
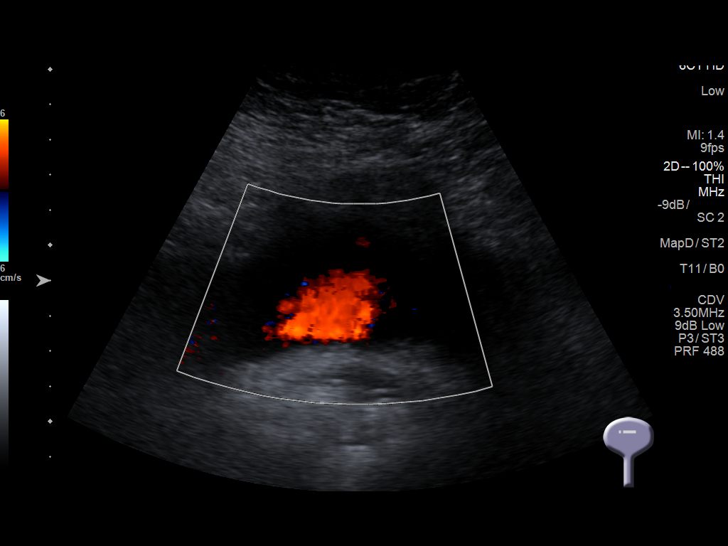

[14 of 25 positions shown; findings below may reference images not displayed]

FINDINGS: Right Kidney:

Length: 9.6 cm. The renal cortical echotexture remains lower than
that of the adjacent liver. There is no mass nor hydronephrosis. No
echogenic shadowing stones are observed.

Left Kidney:

Length: 10.7 cm.. The renal cortical echotexture is normal. No
cystic or solid mass or hydronephrosis is observed. No echogenic
shadowing stones are demonstrated.

Bladder:

The partially distended urinary bladder is normal. Bilateral
ureteral jets are demonstrated.
IMPRESSION: Normal renal ultrasound for age.

## 2017-05-20 ENCOUNTER — Telehealth: Payer: Self-pay | Admitting: Cardiovascular Disease

## 2017-05-20 NOTE — Telephone Encounter (Signed)
Per Dr. Stephanie Coup new patient referral for recent hospital admission for PE on Eliquis.    Added to wait List only wants [place]

## 2017-05-21 ENCOUNTER — Ambulatory Visit
Admission: RE | Admit: 2017-05-21 | Discharge: 2017-05-21 | Disposition: A | Discharge status: Home / Self Care | Payer: Medicare Other | Source: Ambulatory Visit | Attending: Nurse Practitioner | Admitting: Nurse Practitioner

## 2017-05-21 DIAGNOSIS — Z1231 Encounter for screening mammogram for malignant neoplasm of breast: Secondary | ICD-10-CM | POA: Diagnosis not present

## 2017-05-23 NOTE — Telephone Encounter (Signed)
S/w pt's daughter, Felicidad, (ok per [facility]) and offered Sept 24 appt w/Dr. Fletcher Anon. Daughter is appreciative but declines as she has a scheduled procedure Monday and daughter provides transportation to all pt's appts. Daughter will be out of town for a month and the soonest she can bring pt is November.  Will keep November appt.

## 2017-05-23 NOTE — Telephone Encounter (Signed)
Left message on machine for patient to contact the office regarding sooner appt. Dr. Fletcher Anon has 9/24, 1pm opening.

## 2017-07-21 ENCOUNTER — Inpatient Hospital Stay: Payer: Medicare Other | Attending: Internal Medicine

## 2017-07-21 ENCOUNTER — Other Ambulatory Visit: Payer: Self-pay

## 2017-07-21 ENCOUNTER — Encounter: Payer: Self-pay | Admitting: Internal Medicine

## 2017-07-21 ENCOUNTER — Inpatient Hospital Stay (HOSPITAL_BASED_OUTPATIENT_CLINIC_OR_DEPARTMENT_OTHER): Payer: Medicare Other | Admitting: Internal Medicine

## 2017-07-21 VITALS — BP 186/94 | HR 92 | Temp 97.8°F | Resp 20 | Ht <= 58 in | Wt 180.0 lb

## 2017-07-21 DIAGNOSIS — Z8052 Family history of malignant neoplasm of bladder: Secondary | ICD-10-CM | POA: Diagnosis not present

## 2017-07-21 DIAGNOSIS — Z79899 Other long term (current) drug therapy: Secondary | ICD-10-CM | POA: Insufficient documentation

## 2017-07-21 DIAGNOSIS — E669 Obesity, unspecified: Secondary | ICD-10-CM | POA: Diagnosis not present

## 2017-07-21 DIAGNOSIS — E119 Type 2 diabetes mellitus without complications: Secondary | ICD-10-CM | POA: Diagnosis not present

## 2017-07-21 DIAGNOSIS — I10 Essential (primary) hypertension: Secondary | ICD-10-CM | POA: Insufficient documentation

## 2017-07-21 DIAGNOSIS — M7989 Other specified soft tissue disorders: Secondary | ICD-10-CM | POA: Diagnosis not present

## 2017-07-21 DIAGNOSIS — K219 Gastro-esophageal reflux disease without esophagitis: Secondary | ICD-10-CM | POA: Diagnosis not present

## 2017-07-21 DIAGNOSIS — R0602 Shortness of breath: Secondary | ICD-10-CM

## 2017-07-21 DIAGNOSIS — Z86711 Personal history of pulmonary embolism: Secondary | ICD-10-CM | POA: Insufficient documentation

## 2017-07-21 DIAGNOSIS — E039 Hypothyroidism, unspecified: Secondary | ICD-10-CM | POA: Insufficient documentation

## 2017-07-21 DIAGNOSIS — Z85850 Personal history of malignant neoplasm of thyroid: Secondary | ICD-10-CM | POA: Insufficient documentation

## 2017-07-21 DIAGNOSIS — I87001 Postthrombotic syndrome without complications of right lower extremity: Secondary | ICD-10-CM

## 2017-07-21 DIAGNOSIS — Z86718 Personal history of other venous thrombosis and embolism: Secondary | ICD-10-CM | POA: Diagnosis not present

## 2017-07-21 DIAGNOSIS — M797 Fibromyalgia: Secondary | ICD-10-CM | POA: Diagnosis not present

## 2017-07-21 DIAGNOSIS — E785 Hyperlipidemia, unspecified: Secondary | ICD-10-CM

## 2017-07-21 DIAGNOSIS — Z7901 Long term (current) use of anticoagulants: Secondary | ICD-10-CM | POA: Diagnosis not present

## 2017-07-21 DIAGNOSIS — I2699 Other pulmonary embolism without acute cor pulmonale: Secondary | ICD-10-CM

## 2017-07-21 DIAGNOSIS — G4730 Sleep apnea, unspecified: Secondary | ICD-10-CM | POA: Diagnosis not present

## 2017-07-21 DIAGNOSIS — R079 Chest pain, unspecified: Secondary | ICD-10-CM

## 2017-07-21 LAB — CBC WITH DIFFERENTIAL/PLATELET
BASOS PCT: 1 %
Basophils Absolute: 0.1 10*3/uL (ref 0–0.1)
EOS PCT: 1 %
Eosinophils Absolute: 0.1 10*3/uL (ref 0–0.7)
HEMATOCRIT: 42.3 % (ref 35.0–47.0)
Hemoglobin: 14.1 g/dL (ref 12.0–16.0)
Lymphocytes Relative: 25 %
Lymphs Abs: 2.2 10*3/uL (ref 1.0–3.6)
MCH: 29.5 pg (ref 26.0–34.0)
MCHC: 33.4 g/dL (ref 32.0–36.0)
MCV: 88.3 fL (ref 80.0–100.0)
MONO ABS: 0.6 10*3/uL (ref 0.2–0.9)
MONOS PCT: 7 %
NEUTROS ABS: 5.7 10*3/uL (ref 1.4–6.5)
Neutrophils Relative %: 66 %
PLATELETS: 224 10*3/uL (ref 150–440)
RBC: 4.79 MIL/uL (ref 3.80–5.20)
RDW: 13.9 % (ref 11.5–14.5)
WBC: 8.7 10*3/uL (ref 3.6–11.0)

## 2017-07-21 LAB — COMPREHENSIVE METABOLIC PANEL
ALBUMIN: 4 g/dL (ref 3.5–5.0)
ALK PHOS: 75 U/L (ref 38–126)
ALT: 23 U/L (ref 14–54)
ANION GAP: 8 (ref 5–15)
AST: 23 U/L (ref 15–41)
BUN: 11 mg/dL (ref 6–20)
CALCIUM: 9.1 mg/dL (ref 8.9–10.3)
CO2: 25 mmol/L (ref 22–32)
Chloride: 108 mmol/L (ref 101–111)
Creatinine, Ser: 0.58 mg/dL (ref 0.44–1.00)
GFR calc Af Amer: >60 mL/min (ref >60)
GFR calc non Af Amer: >60 mL/min (ref >60)
GLUCOSE: 118 mg/dL — AB (ref 65–99)
Potassium: 3.8 mmol/L (ref 3.5–5.1)
SODIUM: 141 mmol/L (ref 135–145)
TOTAL PROTEIN: 8.1 g/dL (ref 6.5–8.1)
Total Bilirubin: 0.4 mg/dL (ref 0.3–1.2)

## 2017-07-21 NOTE — Assessment & Plan Note (Addendum)
Given her # BIL PE-moderate to large clot burden without right heart strain. Patient currently on Eliquis tolerating it well. Patient also status post IVC filter.  Patient is recommended at minimum of one year of anticoagulation.  Patient likely a candidate for long-term/indefinite anticoagulation [given immobility as her biggest risk factor-which is unfortunately not likely to change].  # Long discussion regarding-avoiding falls given the blood thinners.   # We will plan to order hypercoagulable workup could be considered in future- at this time this would not affect the management.  # Right lower extremity postphlebitic syndrome- recommend stockings.  #Sharp intermittent chest pains/reproducible on deep palpation-likely fibromyalgia; however if worse recommend follow-up with PCP  # OSA/sleep apnea-continue CPAP  #Follow-up in 6 months/CBC CMP.

## 2017-07-21 NOTE — Progress Notes (Signed)
Pt c/o bil. Lower extremity edema and intermittent chest discomfort. She denies any shortness of breath. She states that she has an Building control surveyor (which was placed in June) and has a f/u with vascular within the next week.

## 2017-07-21 NOTE — Progress Notes (Signed)
[facility] NOTE  Patient Care Team: Perrin Maltese, MD as PCP - General (Internal Medicine) Perrin Maltese, MD as Referring Physician (Internal Medicine) Robert Bellow, MD (General Surgery)  CHIEF COMPLAINTS/PURPOSE OF CONSULTATION:  Bilateral PE & Right LE DVT   # June MID 2018 Bil PE mod-large/Right LE DVT- [immobility sec to chronic arthritis] s/p IVC filter [Dr.Schneir] on Eliquis.   # DEC 2011- Thyroid cancer s/p sub total thyroidectomy & DM-2 [Dr.Solum; s/p  RAI; [facility] ].   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 OSA- CPAP   No history exists.     HISTORY OF PRESENTING ILLNESS:  Katie Baker [age] y.o.  female recently diagnosed bilateral PE and right lower extremity DVT-currently on Eliquis is here for follow-up.  Patient complains of intermittent "sharp chest pains; with shortness of breath"-for the last many months.  Patient had similar episodes in the past.  Patient states that she has recently unable to fill her pain medications-she has not been able to keep up with her appointments in the pain clinic.   She also complains of chronic swelling in the legs.  She has not been wearing stockings.  Denies any blood in stools or black colored stools.  Denies any falls.  ROS: A complete 10 point review of system is done which is negative except mentioned above in history of present illness  MEDICAL HISTORY:  Past Medical History:  Diagnosis Date  . Deep vein thrombosis (DVT) ([place])   . Diabetes mellitus, type II ([place])   . Dislocation of hip, congenital   . Fibromyalgia   . Hearing loss    left ear  . Hyperlipidemia   . Hypertension   . Hypothyroidism   . Obesity   . Prediabetes   . Pulmonary embolism ([place]) 01/2017  . Reflux   . Sleep apnea   . Spinal stenosis   . Tendonitis   . Thyroid cancer [facility])     SURGICAL HISTORY: Past Surgical History:  Procedure  Laterality Date  . COLONOSCOPY WITH PROPOFOL N/A 02/09/2015   Performed by [name], MD at [facility]  . ESOPHAGOGASTRODUODENOSCOPY (EGD) N/A 02/09/2015   Performed by [name], MD at [facility]  . HIP SURGERY    . IVC Filter Insertion N/A 02/14/2017   Performed by Katha Cabal, MD at [facility]  . THYROID SURGERY      SOCIAL HISTORY: Social History   Socioeconomic History  . Marital status: Divorced    Spouse name: Not on file  . Number of children: Not on file  . Years of education: Not on file  . Highest education level: Not on file  Social Needs  . Financial resource strain: Not on file  . Food insecurity - worry: Not on file  . Food insecurity - inability: Not on file  . Transportation needs - medical: Not on file  . Transportation needs - non-medical: Not on file  Occupational History  . Not on file  Tobacco Use  . Smoking status: Never Smoker  . Smokeless tobacco: Never Used  Substance and Sexual Activity  . Alcohol use: No    Alcohol/week: 0.0 oz  . Drug use: No  . Sexual activity: No  Other Topics Concern  . Not on file  Social History Narrative  . Not on file    FAMILY HISTORY: Family History  Problem Relation Age of Onset  .  Heart disease Mother   . Hypertension Mother   . Alzheimer's disease Mother   . Diabetes Mother   . Stroke Mother   . Anxiety disorder Mother   . Diabetes Father   . Alcohol abuse Father   . Bladder Cancer Sister   . Thyroid disease Brother   . Diabetes Daughter   . Diabetes Maternal Aunt     ALLERGIES:  has No Known Allergies.  MEDICATIONS:  Current Outpatient Medications  Medication Sig Dispense Refill  . apixaban (ELIQUIS) 5 MG TABS tablet Take 1 tablet (5 mg total) by mouth 2 (two) times daily. Start after 6 days. 60 tablet 0  . Blood Glucose Monitoring Suppl (ONE TOUCH ULTRA MINI) W/DEVICE KIT See admin instructions.  0  . dicyclomine (BENTYL) 10 MG capsule Take 10 mg by  mouth 4 (four) times daily -  before meals and at bedtime.    . fluticasone (FLONASE) 50 MCG/ACT nasal spray Place 1 spray into both nostrils daily as needed.  12  . gabapentin (NEURONTIN) 300 MG capsule Take 1 cap every 12 hours  0  . glucose blood test strip 1 each by Other route as needed for other. Use as instructed    . levothyroxine (SYNTHROID, LEVOTHROID) 175 MCG tablet Take 1 tablet by mouth daily.  1  . pantoprazole (PROTONIX) 40 MG tablet Take 40 mg by mouth daily.  5  . PAZEO 0.7 % SOLN Place 1 drop into both eyes 2 (two) times daily as needed.  3  . ranitidine (ZANTAC) 300 MG tablet Take 1 tablet by mouth daily.  1  . RESTASIS 0.05 % ophthalmic emulsion Place 1 drop into both eyes 2 (two) times daily.  3  . rosuvastatin (CRESTOR) 40 MG tablet Take 40 mg by mouth.     . senna-docusate (SENOKOT-S) 8.6-50 MG tablet Take 2 tablets by mouth 2 (two) times daily. 30 tablet 0  . sitaGLIPtin (JANUVIA) 100 MG tablet Take 100 mg by mouth daily.    . Vitamin D, Ergocalciferol, (DRISDOL) 50000 units CAPS capsule Take 1 capsule once a week by mouth.    Marland Kitchen apixaban (ELIQUIS) 5 MG TABS tablet Take 2 tablets (10 mg total) by mouth 2 (two) times daily. 24 tablet 0  . LINZESS 72 MCG capsule Take 1 capsule by mouth daily.  1  . morphine (MS CONTIN) 15 MG 12 hr tablet Take 1 tablet (15 mg total) by mouth every 12 (twelve) hours. (Patient not taking: Reported on 07/21/2017) 30 tablet 0  . oxyCODONE-acetaminophen (PERCOCET/ROXICET) 5-325 MG tablet Take 1 tablet by mouth every 8 (eight) hours as needed for moderate pain or severe pain. (Patient not taking: Reported on 07/21/2017) 20 tablet 0   No current facility-administered medications for this visit.       Marland Kitchen  PHYSICAL EXAMINATION:   Vitals:   07/21/17 1104  BP: (!) 186/94  Pulse: 92  Resp: 20  Temp: 97.8 F (36.6 C)   Filed Weights   07/21/17 1104  Weight: 180 lb (81.6 kg)    GENERAL: Well-nourished well-developed; Alert, no distress  and comfortable.   Accompanied by grandson; wakling with cane.Marland Kitchen  EYES: no pallor or icterus OROPHARYNX: no thrush or ulceration; good dentition  NECK: supple, no masses felt LYMPH:  no palpable lymphadenopathy in the cervical, axillary or inguinal regions LUNGS: clear to auscultation and  No wheeze or crackles HEART/CVS: regular rate & rhythm and no murmurs; No lower extremity edema ABDOMEN: abdomen soft, non-tender and normal bowel  sounds Musculoskeletal:no cyanosis of digits and no clubbing;  tenderness noted at the costovertebral junction on deep palpation. PSYCH: alert & oriented x 3 with fluent speech NEURO: no focal motor/sensory deficits SKIN:  no rashes or significant lesions  LABORATORY DATA:  Baker have reviewed the data as listed Lab Results  Component Value Date   WBC 8.7 07/21/2017   HGB 14.1 07/21/2017   HCT 42.3 07/21/2017   MCV 88.3 07/21/2017   PLT 224 07/21/2017   Recent Labs    02/14/17 0448 02/16/17 0515 07/21/17 1035  NA 140 140 141  K 3.5 3.5 3.8  CL 106 106 108  CO2 _0 GLUCOSE 133* 123* 118*  BUN _1 CREATININE 0.51 0.63 0.58  CALCIUM 8.3* 8.3* 9.1  GFRNONAA >60 >60 >60  GFRAA >60 >60 >60  PROT  --   --  8.1  ALBUMIN  --   --  4.0  AST  --   --  23  ALT  --   --  23  ALKPHOS  --   --  75  BILITOT  --   --  0.4    RADIOGRAPHIC STUDIES: Baker have personally reviewed the radiological images as listed and agreed with the findings in the report. No results found.  ASSESSMENT & PLAN:   Pulmonary embolism, bilateral ([place]) Given her # BIL PE-moderate to large clot burden without right heart strain. Patient currently on Eliquis tolerating it well. Patient also status post IVC filter.  Patient is recommended at minimum of one year of anticoagulation.  Patient likely a candidate for long-term/indefinite anticoagulation [given immobility as her biggest risk factor-which is unfortunately not likely to change].  # Long discussion  regarding-avoiding falls given the blood thinners.   # We will plan to order hypercoagulable workup could be considered in future- at this time this would not affect the management.  # Right lower extremity postphlebitic syndrome- recommend stockings.  #Sharp intermittent chest pains/reproducible on deep palpation-likely fibromyalgia; however if worse recommend follow-up with PCP  # OSA/sleep apnea-continue CPAP  #Follow-up in 6 months/CBC CMP.      Cammie Sickle, MD 07/21/2017 1:36 PM

## 2017-07-28 ENCOUNTER — Encounter: Payer: Self-pay | Admitting: Cardiovascular Disease

## 2017-07-28 ENCOUNTER — Ambulatory Visit (INDEPENDENT_AMBULATORY_CARE_PROVIDER_SITE_OTHER): Payer: Medicare Other | Admitting: Cardiovascular Disease

## 2017-07-28 VITALS — BP 140/80 | HR 82 | Ht <= 58 in | Wt 175.0 lb

## 2017-07-28 DIAGNOSIS — R079 Chest pain, unspecified: Secondary | ICD-10-CM | POA: Diagnosis not present

## 2017-07-28 DIAGNOSIS — I10 Essential (primary) hypertension: Secondary | ICD-10-CM | POA: Diagnosis not present

## 2017-07-28 DIAGNOSIS — R0602 Shortness of breath: Secondary | ICD-10-CM

## 2017-07-28 DIAGNOSIS — I2699 Other pulmonary embolism without acute cor pulmonale: Secondary | ICD-10-CM

## 2017-07-28 NOTE — Patient Instructions (Addendum)
Medication Instructions:  Your physician recommends that you continue on your current medications as directed. Please refer to the Current Medication list given to you today.   Labwork: none  Testing/Procedures: Your physician has requested that you have a lexiscan myoview. For further information please visit [email or web address]. Please follow instruction sheet, as given.  [place]  Your caregiver has ordered a Stress Test with nuclear imaging. The purpose of this test is to evaluate the blood supply to your heart muscle. This procedure is referred to as a "Non-Invasive Stress Test." This is because other than having an IV started in your vein, nothing is inserted or "invades" your body. Cardiac stress tests are done to find areas of poor blood flow to the heart by determining the extent of coronary artery disease (CAD). Some patients exercise on a treadmill, which naturally increases the blood flow to your heart, while others who are  unable to walk on a treadmill due to physical limitations have a pharmacologic/chemical stress agent called Lexiscan . This medicine will mimic walking on a treadmill by temporarily increasing your coronary blood flow.   Please note: these test may take anywhere between 2-4 hours to complete  PLEASE REPORT TO [facility] AT THE FIRST DESK WILL DIRECT YOU WHERE TO GO  Date of Procedure:___Thursday, November 29___  Arrival Time for Procedure:   7:45am Instructions regarding medication:   _xx___ : Hold diabetes medication morning of procedure    PLEASE NOTIFY THE OFFICE AT LEAST 24 HOURS IN ADVANCE IF YOU ARE UNABLE TO KEEP YOUR APPOINTMENT.  [phone] AND  PLEASE NOTIFY NUCLEAR MEDICINE AT [facility] AT LEAST 24 HOURS IN ADVANCE IF YOU ARE UNABLE TO KEEP YOUR APPOINTMENT. [phone]  How to prepare for your Myoview test:  1. Do not eat or drink after midnight 2. No caffeine for 24 hours prior to test 3. No smoking 24  hours prior to test. 4. Your medication may be taken with water.  If your doctor stopped a medication because of this test, do not take that medication. 5. Ladies, please do not wear dresses.  Skirts or pants are appropriate. Please wear a short sleeve shirt. 6. No perfume, cologne or lotion. 7. Wear comfortable walking shoes. No heels!            Follow-Up: Your physician wants you to follow-up in: 6 months with Dr. Fletcher Anon.  You will receive a reminder letter in the mail two months in advance. If you don't receive a letter, please call our office to schedule the follow-up appointment.   Any Other Special Instructions Will Be Listed Below (If Applicable).     If you need a refill on your cardiac medications before your next appointment, please call your pharmacy.  Cardiac Nuclear Scan A cardiac nuclear scan is a test that measures blood flow to the heart when a person is resting and when he or she is exercising. The test looks for problems such as:  Not enough blood reaching a portion of the heart.  The heart muscle not working normally.  You may need this test if:  You have heart disease.  You have had abnormal lab results.  You have had heart surgery or angioplasty.  You have chest pain.  You have shortness of breath.  In this test, a radioactive dye (tracer) is injected into your bloodstream. After the tracer has traveled to your heart, an imaging device is used to measure how much of the tracer is  absorbed by or distributed to various areas of your heart. This procedure is usually done at a hospital and takes 2-4 hours. Tell a health care provider about:  Any allergies you have.  All medicines you are taking, including vitamins, herbs, eye drops, creams, and over-the-counter medicines.  Any problems you or family members have had with the use of anesthetic medicines.  Any blood disorders you have.  Any surgeries you have had.  Any medical conditions you  have.  Whether you are pregnant or may be pregnant. What are the risks? Generally, this is a safe procedure. However, problems may occur, including:  Serious chest pain and heart attack. This is only a risk if the stress portion of the test is done.  Rapid heartbeat.  Sensation of warmth in your chest. This usually passes quickly.  What happens before the procedure?  Ask your health care provider about changing or stopping your regular medicines. This is especially important if you are taking diabetes medicines or blood thinners.  Remove your jewelry on the day of the procedure. What happens during the procedure?  An IV tube will be inserted into one of your veins.  Your health care provider will inject a small amount of radioactive tracer through the tube.  You will wait for 20-40 minutes while the tracer travels through your bloodstream.  Your heart activity will be monitored with an electrocardiogram (ECG).  You will lie down on an exam table.  Images of your heart will be taken for about 15-20 minutes.  You may be asked to exercise on a treadmill or stationary bike. While you exercise, your heart's activity will be monitored with an ECG, and your blood pressure will be checked. If you are unable to exercise, you may be given a medicine to increase blood flow to parts of your heart.  When blood flow to your heart has peaked, a tracer will again be injected through the IV tube.  After 20-40 minutes, you will get back on the exam table and have more images taken of your heart.  When the procedure is over, your IV tube will be removed. The procedure may vary among health care providers and hospitals. Depending on the type of tracer used, scans may need to be repeated 3-4 hours later. What happens after the procedure?  Unless your health care provider tells you otherwise, you may return to your normal schedule, including diet, activities, and medicines.  Unless your health  care provider tells you otherwise, you may increase your fluid intake. This will help flush the contrast dye from your body. Drink enough fluid to keep your urine clear or pale yellow.  It is up to you to get your test results. Ask your health care provider, or the department that is doing the test, when your results will be ready. Summary  A cardiac nuclear scan measures the blood flow to the heart when a person is resting and when he or she is exercising.  You may need this test if you are at risk for heart disease.  Tell your health care provider if you are pregnant.  Unless your health care provider tells you otherwise, increase your fluid intake. This will help flush the contrast dye from your body. Drink enough fluid to keep your urine clear or pale yellow. This information is not intended to replace advice given to you by your health care provider. Make sure you discuss any questions you have with your health care provider. Document Released: 09/13/2004  Document Revised: 08/21/2016 Document Reviewed: 07/28/2013 Elsevier Interactive Patient Education  2017 [employer].

## 2017-07-28 NOTE — Progress Notes (Signed)
Cardiology Office Note   Date:  07/28/2017   ID:  Katie Baker, DOB [birth year], MRN [id number]  PCP:  Perrin Maltese, MD  Cardiologist:   Kathlyn Sacramento, MD   Chief Complaint  Patient presents with  . other    New patient.  per Dr. Nathanial Rancher for PE. Patient c/o chest pain and SOB. Meds reviewed verbally with patient.       History of Present Illness: Katie Baker is a 56 y.o. female who was referred by Dr. Norris Cross for evaluation of chest pain and exertional dyspnea. She has known history of diabetes mellitus, fibromyalgia, hyperlipidemia, hypertension, sleep apnea, obesity, spinal stenosis and thyroid cancer. She had multiple hip surgeries in the past and due to that she is not very mobile. She has family history of premature coronary artery disease in multiple family members.  She is not a smoker. The patient was hospitalized in June with bilateral pulmonary embolism and deep venous thrombosis affecting the right common femoral and superficial femoral veins.  Echocardiogram showed normal LV systolic function, normal RV size and systolic function. The patient underwent an IVC filter placement. She reports having chest pain at the time of diagnosis of pulmonary embolism with no recent episodes.  However, she continued to have dyspnea with minimal activity.  No orthopnea or PND.  She does have mild leg swelling or diagnosis of DVT. She had stress test in the past more than 2 years ago which was reportedly unremarkable.   Past Medical History:  Diagnosis Date  . Deep vein thrombosis (DVT) ([place])   . Diabetes mellitus, type II ([place])   . Dislocation of hip, congenital   . Fibromyalgia   . Hearing loss    left ear  . Hyperlipidemia   . Hypertension   . Hypothyroidism   . Obesity   . Prediabetes   . Pulmonary embolism ([place]) 01/2017  . Reflux   . Sleep apnea   . Spinal stenosis   . Tendonitis   . Thyroid cancer [facility])     Past Surgical History:  Procedure  Laterality Date  . COLONOSCOPY WITH PROPOFOL N/A 02/09/2015   Procedure: COLONOSCOPY WITH PROPOFOL;  Surgeon: [name], MD;  Location: [facility] ENDOSCOPY;  Service: Endoscopy;  Laterality: N/A;  . ESOPHAGOGASTRODUODENOSCOPY N/A 02/09/2015   Procedure: ESOPHAGOGASTRODUODENOSCOPY (EGD);  Surgeon: [name], MD;  Location[facility] ENDOSCOPY;  Service: Endoscopy;  Laterality: N/A;  . HIP SURGERY    . IVC FILTER INSERTION N/A 02/14/2017   Procedure: IVC Filter Insertion;  Surgeon: Katha Cabal, MD;  Location: [facility];  Service: Cardiovascular;  Laterality: N/A;  . THYROID SURGERY       Current Outpatient Medications  Medication Sig Dispense Refill  . allopurinol (ZYLOPRIM) 100 MG tablet Take 100 mg by mouth daily.    Marland Kitchen apixaban (ELIQUIS) 5 MG TABS tablet Take 1 tablet (5 mg total) by mouth 2 (two) times daily. Start after 6 days. 60 tablet 0  . Blood Glucose Monitoring Suppl (ONE TOUCH ULTRA MINI) W/DEVICE KIT See admin instructions.  0  . colchicine 0.6 MG tablet Take 0.6 mg by mouth.    . dapagliflozin propanediol (FARXIGA) 5 MG TABS tablet Take 5 mg by mouth daily.    Marland Kitchen dicyclomine (BENTYL) 10 MG capsule Take 10 mg by mouth 4 (four) times daily -  before meals and at bedtime.    . fluticasone (FLONASE) 50 MCG/ACT nasal spray Place 1 spray into both  nostrils daily as needed.  12  . gabapentin (NEURONTIN) 300 MG capsule Take 1 cap every 12 hours  0  . glucose blood test strip 1 each by Other route as needed for other. Use as instructed    . levothyroxine (SYNTHROID, LEVOTHROID) 175 MCG tablet Take 1 tablet by mouth daily.  1  . LINZESS 72 MCG capsule Take 1 capsule by mouth daily.  1  . lisinopril (PRINIVIL,ZESTRIL) 40 MG tablet Take 40 mg by mouth daily.    . meloxicam (MOBIC) 7.5 MG tablet Take 7.5 mg by mouth daily as needed.    Marland Kitchen morphine (MS CONTIN) 15 MG 12 hr tablet Take 1 tablet (15 mg total) by mouth every 12 (twelve) hours. 30 tablet 0  . pantoprazole  (PROTONIX) 40 MG tablet Take 40 mg by mouth daily.  5  . PAZEO 0.7 % SOLN Place 1 drop into both eyes 2 (two) times daily as needed.  3  . ranitidine (ZANTAC) 300 MG tablet Take 1 tablet by mouth daily.  1  . RESTASIS 0.05 % ophthalmic emulsion Place 1 drop into both eyes 2 (two) times daily.  3  . rosuvastatin (CRESTOR) 40 MG tablet Take 40 mg by mouth.     . sitaGLIPtin (JANUVIA) 100 MG tablet Take 100 mg by mouth daily.    . Vitamin D, Ergocalciferol, (DRISDOL) 50000 units CAPS capsule Take 1 capsule once a week by mouth.     No current facility-administered medications for this visit.     Allergies:   Patient has no known allergies.    Social History:  The patient  reports that  has never smoked. she has never used smokeless tobacco. She reports that she does not drink alcohol or use drugs.   Family History:  The patient's family history includes Alcohol abuse in her father; Alzheimer's disease in her mother; Anxiety disorder in her mother; Bladder Cancer in her sister; Diabetes in her daughter, father, maternal aunt, and mother; Heart disease in her mother; Hypertension in her mother; Stroke in her mother; Thyroid disease in her brother.    ROS:  Please see the history of present illness.   Otherwise, review of systems are positive for none.   All other systems are reviewed and negative.    PHYSICAL EXAM: VS:  BP 140/80 (BP Location: Right Arm, Patient Position: Sitting, Cuff Size: Normal)   Pulse 82   Ht 4' 8"  (1.422 m)   Wt 175 lb (79.4 kg)   LMP  (LMP Unknown)   BMI 39.23 kg/m  , BMI Body mass index is 39.23 kg/m. GEN: Well nourished, well developed, in no acute distress  HEENT: normal  Neck: no JVD, carotid bruits, or masses Cardiac: RRR; no murmurs, rubs, or gallops,no edema  Respiratory:  clear to auscultation bilaterally, normal work of breathing GI: soft, nontender, nondistended, + BS MS: no deformity or atrophy  Skin: warm and dry, no rash Neuro:  Strength and  sensation are intact Psych: euthymic mood, full affect   EKG:  EKG is ordered today. The ekg ordered today demonstrates normal sinus rhythm with nonspecific T wave changes.   Recent Labs: 07/21/2017: ALT 23; BUN 11; Creatinine, Ser 0.58; Hemoglobin 14.1; Platelets 224; Potassium 3.8; Sodium 141    Lipid Panel No results found for: CHOL, TRIG, HDL, CHOLHDL, VLDL, LDLCALC, LDLDIRECT    Wt Readings from Last 3 Encounters:  07/28/17 175 lb (79.4 kg)  07/21/17 180 lb (81.6 kg)  04/21/17 178 lb (80.7 kg)  PAD Screen 07/28/2017  Previous PAD dx? No  Previous surgical procedure? No  Pain with walking? No  Feet/toe relief with dangling? No  Painful, non-healing ulcers? No  Extremities discolored? Yes      ASSESSMENT AND PLAN:  1.  Exertional dyspnea with atypical chest pain: She had chest pain in the setting of pulmonary embolism which has been treated with anticoagulation.  At the present time, she continues to have dyspnea with minimal activities given her multiple risk factors for coronary artery disease, I think we have to exclude underlying structural disease.  Due to that, I recommend evaluation with a pharmacologic nuclear stress test. She is not able to exercise on a treadmill due to multiple hip surgeries in the past.  He needs treatment of risk factors.  2.  Previous pulmonary embolism and DVT: Clot burden was overall large and given her limited mobility, I favor indefinite long-term anticoagulation. If she requires invasive procedures in the future, Eliquis can be held 2 days before these planned procedures.  However, it is preferred not to interrupt her anticoagulation at least until December.  3.  Essential hypertension: Blood pressure is well controlled on current medications.    Disposition:   FU with me in 6 months  Signed,  Kathlyn Sacramento, MD  07/28/2017 2:08 PM    [facility]

## 2017-07-31 ENCOUNTER — Encounter
Admission: RE | Admit: 2017-07-31 | Discharge: 2017-07-31 | Disposition: A | Discharge status: Home / Self Care | Payer: Medicare Other | Source: Ambulatory Visit | Attending: Cardiovascular Disease | Admitting: Cardiovascular Disease

## 2017-07-31 DIAGNOSIS — R079 Chest pain, unspecified: Secondary | ICD-10-CM | POA: Insufficient documentation

## 2017-07-31 DIAGNOSIS — R0602 Shortness of breath: Secondary | ICD-10-CM | POA: Diagnosis present

## 2017-07-31 LAB — NM MYOCAR MULTI W/SPECT W/WALL MOTION / EF
CHL CUP RESTING HR STRESS: 62 {beats}/min
CHL CUP STRESS STAGE 1 GRADE: 0 %
CHL CUP STRESS STAGE 1 HR: 64 {beats}/min
CHL CUP STRESS STAGE 2 SPEED: 0 mph
CHL CUP STRESS STAGE 3 GRADE: 0 %
CHL CUP STRESS STAGE 3 HR: 111 {beats}/min
CHL CUP STRESS STAGE 4 SPEED: 0 mph
CHL CUP STRESS STAGE 5 DBP: 58 mmHg
CHL CUP STRESS STAGE 5 GRADE: 0 %
CHL CUP STRESS STAGE 5 HR: 106 {beats}/min
CHL CUP STRESS STAGE 5 SBP: 128 mmHg
CHL CUP STRESS STAGE 5 SPEED: 0 mph
CSEPPMHR: 68 %
Estimated workload: 1 METS
LV dias vol: 65 mL (ref 46–106)
LVSYSVOL: 24 mL
Peak HR: 111 {beats}/min
Percent HR: 76 %
SDS: 1
SRS: 1
SSS: 1
Stage 1 Speed: 0 mph
Stage 2 Grade: 0 %
Stage 2 HR: 64 {beats}/min
Stage 3 Speed: 0 mph
Stage 4 Grade: 0 %
Stage 4 HR: 123 {beats}/min
TID: 1.02

## 2017-07-31 MED ORDER — TECHNETIUM TC 99M TETROFOSMIN IV KIT
32.5100 | PACK | Freq: Once | INTRAVENOUS | Status: AC | PRN
  Administered 2017-07-31: 32.51 via INTRAVENOUS

## 2017-07-31 MED ORDER — TECHNETIUM TC 99M TETROFOSMIN IV KIT
13.0000 | PACK | Freq: Once | INTRAVENOUS | Status: AC | PRN
  Administered 2017-07-31: 13.141 via INTRAVENOUS

## 2017-07-31 MED ORDER — REGADENOSON 0.4 MG/5ML IV SOLN
0.4000 mg | Freq: Once | INTRAVENOUS | Status: AC
  Administered 2017-07-31: 0.4 mg via INTRAVENOUS

## 2017-08-18 ENCOUNTER — Ambulatory Visit (INDEPENDENT_AMBULATORY_CARE_PROVIDER_SITE_OTHER): Payer: Medicare Other

## 2017-08-18 ENCOUNTER — Encounter (INDEPENDENT_AMBULATORY_CARE_PROVIDER_SITE_OTHER): Payer: Self-pay | Admitting: Vascular Surgery

## 2017-08-18 ENCOUNTER — Encounter (INDEPENDENT_AMBULATORY_CARE_PROVIDER_SITE_OTHER): Payer: Self-pay

## 2017-08-18 ENCOUNTER — Ambulatory Visit (INDEPENDENT_AMBULATORY_CARE_PROVIDER_SITE_OTHER): Payer: Medicare Other | Admitting: Vascular Surgery

## 2017-08-18 VITALS — BP 125/68 | HR 76 | Resp 16 | Ht <= 58 in | Wt 177.0 lb

## 2017-08-18 DIAGNOSIS — I2602 Saddle embolus of pulmonary artery with acute cor pulmonale: Secondary | ICD-10-CM | POA: Diagnosis not present

## 2017-08-18 DIAGNOSIS — I10 Essential (primary) hypertension: Secondary | ICD-10-CM

## 2017-08-18 DIAGNOSIS — I2699 Other pulmonary embolism without acute cor pulmonale: Secondary | ICD-10-CM | POA: Diagnosis not present

## 2017-08-18 DIAGNOSIS — E782 Mixed hyperlipidemia: Secondary | ICD-10-CM

## 2017-08-18 DIAGNOSIS — E119 Type 2 diabetes mellitus without complications: Secondary | ICD-10-CM | POA: Diagnosis not present

## 2017-08-23 ENCOUNTER — Encounter (INDEPENDENT_AMBULATORY_CARE_PROVIDER_SITE_OTHER): Payer: Self-pay | Admitting: Vascular Surgery

## 2017-08-23 NOTE — Progress Notes (Signed)
MRN : [id number]  Katie Baker is a 56 y.o. (1960/09/11) female who presents with chief complaint of  Chief Complaint  Patient presents with  . Follow-up    5-12mobil ven dvt  .  History of Present Illness:  The patient presents to the office for evaluation of DVT.  [facility].  The initial symptoms were pain and swelling in the lower extremity.  The patient notes the leg continues to be very painful with dependency and swells quite a bite.  Symptoms are much better with elevation.  The patient notes minimal edema in the morning which steadily worsens throughout the day.    The patient has not been using compression therapy at this point.  No SOB or pleuritic chest pains.  No cough or hemoptysis.  No blood per rectum or blood in any sputum.  No excessive bruising per the patient.       Current Meds  Medication Sig  . apixaban (ELIQUIS) 5 MG TABS tablet Take 1 tablet (5 mg total) by mouth 2 (two) times daily. Start after 6 days.  . Blood Glucose Monitoring Suppl (ONE TOUCH ULTRA MINI) W/DEVICE KIT See admin instructions.  . colchicine 0.6 MG tablet Take 0.6 mg by mouth.  . dapagliflozin propanediol (FARXIGA) 5 MG TABS tablet Take 5 mg by mouth daily.  .Marland Kitchendicyclomine (BENTYL) 10 MG capsule Take 10 mg by mouth 4 (four) times daily -  before meals and at bedtime.  . fluticasone (FLONASE) 50 MCG/ACT nasal spray Place 1 spray into both nostrils daily as needed.  . gabapentin (NEURONTIN) 300 MG capsule Take 1 cap every 12 hours  . glucose blood test strip 1 each by Other route as needed for other. Use as instructed  . levothyroxine (SYNTHROID, LEVOTHROID) 175 MCG tablet Take 1 tablet by mouth daily.  .Marland KitchenLINZESS 72 MCG capsule Take 1 capsule by mouth daily.  .Marland Kitchenlisinopril (PRINIVIL,ZESTRIL) 40 MG tablet Take 40 mg by mouth daily.  . meloxicam (MOBIC) 7.5 MG tablet Take 7.5 mg by mouth daily as needed.  .Marland Kitchenmorphine (MS CONTIN) 15 MG 12 hr tablet Take  1 tablet (15 mg total) by mouth every 12 (twelve) hours.  . pantoprazole (PROTONIX) 40 MG tablet Take 40 mg by mouth daily.  .Marland KitchenPAZEO 0.7 % SOLN Place 1 drop into both eyes 2 (two) times daily as needed.  . ranitidine (ZANTAC) 300 MG tablet Take 1 tablet by mouth daily.  . RESTASIS 0.05 % ophthalmic emulsion Place 1 drop into both eyes 2 (two) times daily.  . rosuvastatin (CRESTOR) 40 MG tablet Take 40 mg by mouth.   . sitaGLIPtin (JANUVIA) 100 MG tablet Take 100 mg by mouth daily.  . Vitamin D, Ergocalciferol, (DRISDOL) 50000 units CAPS capsule Take 1 capsule once a week by mouth.    Past Medical History:  Diagnosis Date  . [place] (DVT) ([place]   . Diabetes mellitus, type II ([place]   . Dislocation of hip, congenital   . Fibromyalgia   . Hearing loss    left ear  . Hyperlipidemia   . Hypertension   . Hypothyroidism   . Obesity   . Prediabetes   . [place]monary embolism ([place] 01[place]2017  . Reflux   . Sleep apnea   . Spinal stenosis   . Tendonitis   . Thyroid cancer ([facility]     Past Surgical History:  Procedure Laterality Date  . COLONOSCOPY WITH PROPOFOL N/A  02/09/2015   Procedure: COLONOSCOPY WITH PROPOFOL;  Surgeon: [name], MD;  Location: [facility] ENDOSCOPY;  Service: Endoscopy;  Laterality: N/A;  . ESOPHAGOGASTRODUODENOSCOPY N/A 02/09/2015   Procedure: ESOPHAGOGASTRODUODENOSCOPY (EGD);  Surgeon: [name], MD;  Location[facility] ENDOSCOPY;  Service: Endoscopy;  Laterality: N/A;  . HIP SURGERY    . IVC FILTER INSERTION N/A 02/14/2017   Procedure: IVC Filter Insertion;  Surgeon: Katha Cabal, MD;  Location: [place] CV LAB;  Service: Cardiovascular;  Laterality: N/A;  . THYROID SURGERY      Social History Social History   Tobacco Use  . Smoking status: Never Smoker  . Smokeless tobacco: Never Used  Substance Use Topics  . Alcohol use: No    Alcohol/week: 0.0 oz  . Drug use: No    Family History Family History  Problem Relation Age of  Onset  . Heart disease Mother   . Hypertension Mother   . Alzheimer's disease Mother   . Diabetes Mother   . Stroke Mother   . Anxiety disorder Mother   . Diabetes Father   . Alcohol abuse Father   . Bladder Cancer Sister   . Thyroid disease Brother   . Diabetes Daughter   . Diabetes Maternal Aunt     No Known Allergies   REVIEW OF SYSTEMS (Negative unless checked)  Constitutional: _0 Weight loss  _1 Fever  _2 Chills Cardiac: _3 Chest pain   _4 Chest pressure   _5 Palpitations   _6 Shortness of breath when laying flat   _7 Shortness of breath with exertion. Vascular:  _8 Pain in legs with walking   _9 Pain in legs at rest  _10 History of DVT   _11 Phlebitis   _12 Swelling in legs   _13 Varicose veins   _14 Non-healing ulcers Pulmonary:   _15 Uses home oxygen   _16 Productive cough   _17 Hemoptysis   _18 Wheeze  _19 COPD   _20 Asthma Neurologic:  _21 Dizziness   _22 Seizures   _23 History of stroke   _24 History of TIA  _25 Aphasia   _26 Vissual changes   _27 Weakness or numbness in arm   _28 Weakness or numbness in leg Musculoskeletal:   _29 Joint swelling   _30 Joint pain   _31 Low back pain Hematologic:  _32 Easy bruising  _33 Easy bleeding   _34 Hypercoagulable state   _35 Anemic Gastrointestinal:  _36 Diarrhea   _37 Vomiting  _38 Gastroesophageal reflux/heartburn   _39 Difficulty swallowing. Genitourinary:  _40 Chronic kidney disease   _41 Difficult urination  _42 Frequent urination   _43 Blood in urine Skin:  _44 Rashes   _45 Ulcers  Psychological:  _46 History of anxiety   _47  History of major depression.  Physical Examination  Vitals:   08/18/17 1422  BP: 125/68  Pulse: 76  Resp: 16  Weight: 80.3 kg (177 lb)  Height: _48  (1.422 m)   Body mass index is 39.68 kg/m. Gen: WD/WN, NAD Head: [place]/AT, No temporalis wasting.  Ear/Nose/Throat: Hearing grossly intact, nares w/o erythema or drainage Eyes: PER, EOMI, sclera nonicteric.  Neck: Supple, no large masses.   Pulmonary:  Good air movement, no audible wheezing bilaterally, no use of accessory  muscles.  Cardiac: RRR, no JVD Vascular: scattered varicosities present bilaterally.  Mild venous stasis changes to the legs bilaterally.  2+ soft pitting edema Vessel Right Left  Radial Palpable Palpable  PT Palpable Palpable  DP Palpable Palpable  Gastrointestinal: Non-distended. No guarding/no peritoneal signs.  Musculoskeletal: M/S 5/5 throughout.  No deformity or atrophy.  Neurologic: CN 2-12 intact. Symmetrical.  Speech is fluent. Motor exam as listed above. Psychiatric: Judgment intact, Mood & affect appropriate for pt's clinical situation. Dermatologic: venous rashes  no ulcers noted.  No changes consistent with cellulitis. Lymph : No lichenification or skin changes of chronic lymphedema.  CBC Lab Results  Component Value Date   WBC 8.7 07/21/2017   HGB 14.1 07/21/2017   HCT 42.3 07/21/2017   MCV 88.3 07/21/2017   PLT 224 07/21/2017    BMET    Component Value Date/Time   NA 141 07/21/2017 1035   NA 141 10/01/2011 0721   K 3.8 07/21/2017 1035   K 3.6 10/01/2011 0721   CL 108 07/21/2017 1035   CL 104 10/01/2011 0721   CO2 25 07/21/2017 1035   CO2 28 10/01/2011 0721   GLUCOSE 118 (H) 07/21/2017 1035   GLUCOSE 111 (H) 10/01/2011 0721   BUN 11 07/21/2017 1035   BUN 10 10/01/2011 0721   CREATININE 0.58 07/21/2017 1035   CREATININE 0.51 (L) 10/01/2011 0721   CALCIUM 9.1 07/21/2017 1035   CALCIUM 9.1 10/01/2011 0721   GFRNONAA >60 07/21/2017 1035   GFRNONAA >60 10/01/2011 0721   GFRAA >60 07/21/2017 1035   GFRAA >60 10/01/2011 0721   CrCl cannot be calculated (Patient's most recent lab result is older than the maximum 21 days allowed.).  COAG Lab Results  Component Value Date   INR 0.96 02/12/2017    Radiology Nm Myocar Multi W/spect W/wall Motion / Ef  Result Date: 07/31/2017 Pharmacological myocardial perfusion imaging study with no significant  Ischemia Small region of mild fixed anteroseptal perfusion defect noted, consistent with breast attenuation  artifact. Normal wall motion, EF estimated at 73% No EKG changes concerning for ischemia at peak stress or in recovery. Low risk scan Signed, Esmond Plants, MD, Ph.D [facility]    Assessment/Plan 1. Pulmonary embolism, bilateral ([place]) The patient will continue anticoagulation for now as there have not been any problems or complications at this point.  IVC filter should be removed IVC filter placement will be done the week for surgery.   Risk and benefits were reviewed the patient.  Indications for the procedure were reviewed.  All questions were answered, the patient agrees to proceed.   I have had a long discussion with the patient regarding DVT and post phlebitic changes such as swelling and why it  causes symptoms such as pain.  The patient will wear graduated compression stockings class 1 (20-30 mmHg) on a daily basis a prescription was given. The patient will  beginning wearing the stockings first thing in the morning and removing them in the evening. The patient is instructed specifically not to sleep in the stockings.  In addition, behavioral modification including elevation during the day and avoidance of prolonged dependency will be initiated.    The patient will follow-up with me in 3 months after the joint replacement surgery to discuss removal (this was also discussed today and the patient agrees with the plan to have the filter removed).    2. Essential hypertension Continue antihypertensive medications as already ordered, these medications have been reviewed and there are no changes at this time.   3. Type 2 diabetes mellitus without complication, without long-term current use of insulin (HCC) Continue hypoglycemic medications as already ordered, these medications have been reviewed and there are no changes at this time.  Hgb A1C to be monitored as already arranged by primary service   4. Mixed hyperlipidemia Continue statin as ordered and reviewed, no changes at this  time     Hortencia Pilar, MD  08/23/2017 2:13 PM

## 2017-08-29 ENCOUNTER — Other Ambulatory Visit (INDEPENDENT_AMBULATORY_CARE_PROVIDER_SITE_OTHER): Payer: Self-pay | Admitting: Vascular Surgery

## 2017-09-16 ENCOUNTER — Encounter: Admission: RE | Payer: Self-pay | Source: Ambulatory Visit

## 2017-09-16 ENCOUNTER — Ambulatory Visit: Admission: RE | Admit: 2017-09-16 | Payer: Medicare Other | Source: Ambulatory Visit | Admitting: Vascular Surgery

## 2017-09-16 SURGERY — IVC FILTER REMOVAL
Anesthesia: Moderate Sedation

## 2017-10-02 ENCOUNTER — Other Ambulatory Visit: Payer: Self-pay | Admitting: Physician Assistant

## 2017-10-02 DIAGNOSIS — M4307 Spondylolysis, lumbosacral region: Secondary | ICD-10-CM

## 2017-10-02 DIAGNOSIS — M545 Low back pain, unspecified: Secondary | ICD-10-CM

## 2018-01-19 ENCOUNTER — Inpatient Hospital Stay: Payer: Medicare Other | Admitting: Internal Medicine

## 2018-01-19 ENCOUNTER — Inpatient Hospital Stay: Payer: Medicare Other

## 2018-01-19 NOTE — Assessment & Plan Note (Deleted)
Given her # BIL PE-moderate to large clot burden without right heart strain. Patient currently on Eliquis tolerating it well. Patient also status post IVC filter.  Patient is recommended at minimum of one year of anticoagulation.  Patient likely a candidate for long-term/indefinite anticoagulation [given immobility as her biggest risk factor-which is unfortunately not likely to change].  # Long discussion regarding-avoiding falls given the blood thinners.   # We will plan to order hypercoagulable workup could be considered in future- at this time this would not affect the management.  # Right lower extremity postphlebitic syndrome- recommend stockings.  #Sharp intermittent chest pains/reproducible on deep palpation-likely fibromyalgia; however if worse recommend follow-up with PCP  # OSA/sleep apnea-continue CPAP  #Follow-up in 6 months/CBC CMP.

## 2018-01-19 NOTE — Progress Notes (Deleted)
[place] NOTE  Patient Care Team: Perrin Maltese, MD as PCP - General (Internal Medicine) Perrin Maltese, MD as Referring Physician (Internal Medicine) Robert Bellow, MD (General Surgery)  CHIEF COMPLAINTS/PURPOSE OF CONSULTATION:  Bilateral PE & Right LE DVT   # June MID 2018 Bil PE mod-large/Right LE DVT- [immobility sec to chronic arthritis] s/p IVC filter [Dr.Schneir] on Eliquis.   # DEC 2011- Thyroid cancer s/p sub total thyroidectomy & DM-2 [Dr.Solum; s/p  RAI; [facility] ].   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 OSA- CPAP   No history exists.     HISTORY OF PRESENTING ILLNESS:  Katie Baker [age] y.o.  female recently diagnosed bilateral PE and right lower extremity DVT-currently on Eliquis is here for follow-up.  Patient complains of intermittent "sharp chest pains; with shortness of breath"-for the last many months.  Patient had similar episodes in the past.  Patient states that she has recently unable to fill her pain medications-she has not been able to keep up with her appointments in the pain clinic.   She also complains of chronic swelling in the legs.  She has not been wearing stockings.  Denies any blood in stools or black colored stools.  Denies any falls.  ROS: A complete 10 point review of system is done which is negative except mentioned above in history of present illness  MEDICAL HISTORY:  Past Medical History:  Diagnosis Date  . Deep vein thrombosis (DVT) ([place])   . Diabetes mellitus, type II ([place])   . Dislocation of hip, congenital   . Fibromyalgia   . Hearing loss    left ear  . Hyperlipidemia   . Hypertension   . Hypothyroidism   . Obesity   . Prediabetes   . Pulmonary embolism ([place]) 01/2017  . Reflux   . Sleep apnea   . Spinal stenosis   . Tendonitis   . Thyroid cancer [facility])     SURGICAL HISTORY: Past Surgical History:  Procedure  Laterality Date  . COLONOSCOPY WITH PROPOFOL N/A 02/09/2015   Procedure: COLONOSCOPY WITH PROPOFOL;  Surgeon: [name], MD;  Location: [facility] ENDOSCOPY;  Service: Endoscopy;  Laterality: N/A;  . ESOPHAGOGASTRODUODENOSCOPY N/A 02/09/2015   Procedure: ESOPHAGOGASTRODUODENOSCOPY (EGD);  Surgeon: [name], MD;  Location: [facility] ENDOSCOPY;  Service: Endoscopy;  Laterality: N/A;  . HIP SURGERY    . IVC FILTER INSERTION N/A 02/14/2017   Procedure: IVC Filter Insertion;  Surgeon: Katha Cabal, MD;  Location: [place] CV LAB;  Service: Cardiovascular;  Laterality: N/A;  . THYROID SURGERY      SOCIAL HISTORY: Social History   Socioeconomic History  . Marital status: Divorced    Spouse name: Not on file  . Number of children: Not on file  . Years of education: Not on file  . Highest education level: Not on file  Occupational History  . Not on file  Social Needs  . Financial resource strain: Not on file  . Food insecurity:    Worry: Not on file    Inability: Not on file  . Transportation needs:    Medical: Not on file    Non-medical: Not on file  Tobacco Use  . Smoking status: Never Smoker  . Smokeless tobacco: Never Used  Substance and Sexual Activity  . Alcohol use: No    Alcohol/week: 0.0 oz  . Drug use: No  . Sexual activity: Never  Lifestyle  . Physical activity:    Days per week: Not on file    Minutes per session: Not on file  . Stress: Not on file  Relationships  . Social connections:    Talks on phone: Not on file    Gets together: Not on file    Attends religious service: Not on file    Active member of club or organization: Not on file    Attends meetings of clubs or organizations: Not on file    Relationship status: Not on file  . Intimate partner violence:    Fear of current or ex partner: Not on file    Emotionally abused: Not on file    Physically abused: Not on file    Forced sexual activity: Not on file  Other Topics Concern  . Not  on file  Social History Narrative  . Not on file    FAMILY HISTORY: Family History  Problem Relation Age of Onset  . Heart disease Mother   . Hypertension Mother   . Alzheimer's disease Mother   . Diabetes Mother   . Stroke Mother   . Anxiety disorder Mother   . Diabetes Father   . Alcohol abuse Father   . Bladder Cancer Sister   . Thyroid disease Brother   . Diabetes Daughter   . Diabetes Maternal Aunt     ALLERGIES:  has No Known Allergies.  MEDICATIONS:  Current Outpatient Medications  Medication Sig Dispense Refill  . allopurinol (ZYLOPRIM) 100 MG tablet Take 100 mg by mouth daily.    Marland Kitchen apixaban (ELIQUIS) 5 MG TABS tablet Take 1 tablet (5 mg total) by mouth 2 (two) times daily. Start after 6 days. 60 tablet 0  . Blood Glucose Monitoring Suppl (ONE TOUCH ULTRA MINI) W/DEVICE KIT See admin instructions.  0  . colchicine 0.6 MG tablet Take 0.6 mg by mouth.    . dapagliflozin propanediol (FARXIGA) 5 MG TABS tablet Take 5 mg by mouth daily.    Marland Kitchen dicyclomine (BENTYL) 10 MG capsule Take 10 mg by mouth 4 (four) times daily -  before meals and at bedtime.    . fluticasone (FLONASE) 50 MCG/ACT nasal spray Place 1 spray into both nostrils daily as needed.  12  . gabapentin (NEURONTIN) 300 MG capsule Take 1 cap every 12 hours  0  . glucose blood test strip 1 each by Other route as needed for other. Use as instructed    . levothyroxine (SYNTHROID, LEVOTHROID) 175 MCG tablet Take 1 tablet by mouth daily.  1  . LINZESS 72 MCG capsule Take 1 capsule by mouth daily.  1  . lisinopril (PRINIVIL,ZESTRIL) 40 MG tablet Take 40 mg by mouth daily.    . meloxicam (MOBIC) 7.5 MG tablet Take 7.5 mg by mouth daily as needed.    Marland Kitchen morphine (MS CONTIN) 15 MG 12 hr tablet Take 1 tablet (15 mg total) by mouth every 12 (twelve) hours. 30 tablet 0  . pantoprazole (PROTONIX) 40 MG tablet Take 40 mg by mouth daily.  5  . PAZEO 0.7 % SOLN Place 1 drop into both eyes 2 (two) times daily as needed.  3  .  ranitidine (ZANTAC) 300 MG tablet Take 1 tablet by mouth daily.  1  . RESTASIS 0.05 % ophthalmic emulsion Place 1 drop into both eyes 2 (two) times daily.  3  . rosuvastatin (CRESTOR) 40 MG tablet Take 40 mg by mouth.     . sitaGLIPtin (JANUVIA) 100 MG  tablet Take 100 mg by mouth daily.    . Vitamin D, Ergocalciferol, (DRISDOL) 50000 units CAPS capsule Take 1 capsule once a week by mouth.     No current facility-administered medications for this visit.       Marland Kitchen  PHYSICAL EXAMINATION:   There were no vitals filed for this visit. There were no vitals filed for this visit.  GENERAL: Well-nourished well-developed; Alert, no distress and comfortable.   Accompanied by grandson; wakling with cane.Marland Kitchen  EYES: no pallor or icterus OROPHARYNX: no thrush or ulceration; good dentition  NECK: supple, no masses felt LYMPH:  no palpable lymphadenopathy in the cervical, axillary or inguinal regions LUNGS: clear to auscultation and  No wheeze or crackles HEART/CVS: regular rate & rhythm and no murmurs; No lower extremity edema ABDOMEN: abdomen soft, non-tender and normal bowel sounds Musculoskeletal:no cyanosis of digits and no clubbing;  tenderness noted at the costovertebral junction on deep palpation. PSYCH: alert & oriented x 3 with fluent speech NEURO: no focal motor/sensory deficits SKIN:  no rashes or significant lesions  LABORATORY DATA:  Baker have reviewed the data as listed Lab Results  Component Value Date   WBC 8.7 07/21/2017   HGB 14.1 07/21/2017   HCT 42.3 07/21/2017   MCV 88.3 07/21/2017   PLT 224 07/21/2017   Recent Labs    02/14/17 0448 02/16/17 0515 07/21/17 1035  NA 140 140 141  K 3.5 3.5 3.8  CL 106 106 108  CO2 27 29 25   GLUCOSE 133* 123* 118*  BUN 8 10 11   CREATININE 0.51 0.63 0.58  CALCIUM 8.3* 8.3* 9.1  GFRNONAA >60 >60 >60  GFRAA >60 >60 >60  PROT  --   --  8.1  ALBUMIN  --   --  4.0  AST  --   --  23  ALT  --   --  23  ALKPHOS  --   --  75  BILITOT  --    --  0.4    RADIOGRAPHIC STUDIES: Baker have personally reviewed the radiological images as listed and agreed with the findings in the report. No results found.  ASSESSMENT & PLAN:   No problem-specific Assessment & Plan notes found for this encounter.     Cammie Sickle, MD 01/19/2018 8:52 AM

## 2018-02-23 ENCOUNTER — Inpatient Hospital Stay (HOSPITAL_BASED_OUTPATIENT_CLINIC_OR_DEPARTMENT_OTHER): Payer: Medicare Other | Admitting: Internal Medicine

## 2018-02-23 ENCOUNTER — Inpatient Hospital Stay: Payer: Medicare Other | Attending: Internal Medicine

## 2018-02-23 VITALS — BP 129/80 | HR 75 | Temp 97.9°F | Resp 22 | Ht <= 58 in | Wt 178.0 lb

## 2018-02-23 DIAGNOSIS — Z85850 Personal history of malignant neoplasm of thyroid: Secondary | ICD-10-CM | POA: Diagnosis not present

## 2018-02-23 DIAGNOSIS — I10 Essential (primary) hypertension: Secondary | ICD-10-CM | POA: Insufficient documentation

## 2018-02-23 DIAGNOSIS — I2699 Other pulmonary embolism without acute cor pulmonale: Secondary | ICD-10-CM

## 2018-02-23 DIAGNOSIS — K219 Gastro-esophageal reflux disease without esophagitis: Secondary | ICD-10-CM

## 2018-02-23 DIAGNOSIS — Z7901 Long term (current) use of anticoagulants: Secondary | ICD-10-CM

## 2018-02-23 DIAGNOSIS — E785 Hyperlipidemia, unspecified: Secondary | ICD-10-CM | POA: Diagnosis not present

## 2018-02-23 DIAGNOSIS — G4730 Sleep apnea, unspecified: Secondary | ICD-10-CM | POA: Insufficient documentation

## 2018-02-23 DIAGNOSIS — M797 Fibromyalgia: Secondary | ICD-10-CM | POA: Insufficient documentation

## 2018-02-23 DIAGNOSIS — M940 Chondrocostal junction syndrome [Tietze]: Secondary | ICD-10-CM | POA: Insufficient documentation

## 2018-02-23 DIAGNOSIS — E669 Obesity, unspecified: Secondary | ICD-10-CM | POA: Diagnosis not present

## 2018-02-23 DIAGNOSIS — Z86718 Personal history of other venous thrombosis and embolism: Secondary | ICD-10-CM

## 2018-02-23 DIAGNOSIS — M7989 Other specified soft tissue disorders: Secondary | ICD-10-CM | POA: Diagnosis not present

## 2018-02-23 DIAGNOSIS — E119 Type 2 diabetes mellitus without complications: Secondary | ICD-10-CM | POA: Diagnosis not present

## 2018-02-23 DIAGNOSIS — E039 Hypothyroidism, unspecified: Secondary | ICD-10-CM | POA: Insufficient documentation

## 2018-02-23 DIAGNOSIS — Z79899 Other long term (current) drug therapy: Secondary | ICD-10-CM | POA: Insufficient documentation

## 2018-02-23 DIAGNOSIS — Z95828 Presence of other vascular implants and grafts: Secondary | ICD-10-CM

## 2018-02-23 LAB — CBC WITH DIFFERENTIAL/PLATELET
BASOS ABS: 0.1 10*3/uL (ref 0–0.1)
BASOS PCT: 1 %
Eosinophils Absolute: 0.2 10*3/uL (ref 0–0.7)
Eosinophils Relative: 2 %
HEMATOCRIT: 42.1 % (ref 35.0–47.0)
Hemoglobin: 14.2 g/dL (ref 12.0–16.0)
LYMPHS PCT: 32 %
Lymphs Abs: 2.5 10*3/uL (ref 1.0–3.6)
MCH: 29.5 pg (ref 26.0–34.0)
MCHC: 33.7 g/dL (ref 32.0–36.0)
MCV: 87.4 fL (ref 80.0–100.0)
Monocytes Absolute: 0.6 10*3/uL (ref 0.2–0.9)
Monocytes Relative: 8 %
NEUTROS ABS: 4.5 10*3/uL (ref 1.4–6.5)
NEUTROS PCT: 57 %
Platelets: 250 10*3/uL (ref 150–440)
RBC: 4.81 MIL/uL (ref 3.80–5.20)
RDW: 13.4 % (ref 11.5–14.5)
WBC: 7.8 10*3/uL (ref 3.6–11.0)

## 2018-02-23 LAB — COMPREHENSIVE METABOLIC PANEL
ALK PHOS: 73 U/L (ref 38–126)
ALT: 25 U/L (ref 14–54)
ANION GAP: 9 (ref 5–15)
AST: 22 U/L (ref 15–41)
Albumin: 4 g/dL (ref 3.5–5.0)
BILIRUBIN TOTAL: 0.4 mg/dL (ref 0.3–1.2)
BUN: 13 mg/dL (ref 6–20)
CALCIUM: 9.1 mg/dL (ref 8.9–10.3)
CO2: 23 mmol/L (ref 22–32)
Chloride: 108 mmol/L (ref 101–111)
Creatinine, Ser: 0.58 mg/dL (ref 0.44–1.00)
Glucose, Bld: 147 mg/dL — ABNORMAL HIGH (ref 65–99)
POTASSIUM: 4 mmol/L (ref 3.5–5.1)
Sodium: 140 mmol/L (ref 135–145)
TOTAL PROTEIN: 7.9 g/dL (ref 6.5–8.1)

## 2018-02-23 NOTE — Progress Notes (Signed)
[facility] NOTE  Patient Care Team: Perrin Maltese, MD as PCP - General (Internal Medicine) Perrin Maltese, MD as Referring Physician (Internal Medicine) Robert Bellow, MD (General Surgery)  CHIEF COMPLAINTS/PURPOSE OF CONSULTATION:  Bilateral PE & Right LE DVT   # June MID 2018 Bil PE mod-large/Right LE DVT- [immobility sec to chronic arthritis] s/p IVC filter [Dr.Schneir] on Eliquis.   # DEC 2011- Thyroid cancer s/p sub total thyroidectomy & DM-2 [Dr.Solum; s/p  RAI; [facility] ].   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 OSA- CPAP   No history exists.     HISTORY OF PRESENTING ILLNESS:  Katie Baker [age] y.o.  female recently diagnosed bilateral PE and right lower extremity DVT-currently on Eliquis is here for follow-up.  Patient continues to complain of chronic fatigue; also complains of chronic multiple joint pains.  She continues not to be very active.  She also complains of chronic swelling in the legs.  She has not been wearing stockings.  Denies any blood in stools or black colored stools.  Denies any falls.  ROS: A complete 10 point review of system is done which is negative except mentioned above in history of present illness  MEDICAL HISTORY:  Past Medical History:  Diagnosis Date  . Deep vein thrombosis (DVT) ([place])   . Diabetes mellitus, type II ([place])   . Dislocation of hip, congenital   . Fibromyalgia   . Hearing loss    left ear  . Hyperlipidemia   . Hypertension   . Hypothyroidism   . Obesity   . Prediabetes   . Pulmonary embolism ([place]) 01/2017  . Reflux   . Sleep apnea   . Spinal stenosis   . Tendonitis   . Thyroid cancer [facility])     SURGICAL HISTORY: Past Surgical History:  Procedure Laterality Date  . COLONOSCOPY WITH PROPOFOL N/A 02/09/2015   Procedure: COLONOSCOPY WITH PROPOFOL;  Surgeon: [name], MD;  Location: [facility] ENDOSCOPY;  Service:  Endoscopy;  Laterality: N/A;  . ESOPHAGOGASTRODUODENOSCOPY N/A 02/09/2015   Procedure: ESOPHAGOGASTRODUODENOSCOPY (EGD);  Surgeon: [name], MD;  Location: [facility] ENDOSCOPY;  Service: Endoscopy;  Laterality: N/A;  . HIP SURGERY    . IVC FILTER INSERTION N/A 02/14/2017   Procedure: IVC Filter Insertion;  Surgeon: Katha Cabal, MD;  Location: [place] CV LAB;  Service: Cardiovascular;  Laterality: N/A;  . THYROID SURGERY      SOCIAL HISTORY: Social History   Socioeconomic History  . Marital status: Divorced    Spouse name: Not on file  . Number of children: Not on file  . Years of education: Not on file  . Highest education level: Not on file  Occupational History  . Not on file  Social Needs  . Financial resource strain: Not on file  . Food insecurity:    Worry: Not on file    Inability: Not on file  . Transportation needs:    Medical: Not on file    Non-medical: Not on file  Tobacco Use  . Smoking status: Never Smoker  . Smokeless tobacco: Never Used  Substance and Sexual Activity  . Alcohol use: No    Alcohol/week: 0.0 oz  . Drug use: No  . Sexual activity: Never  Lifestyle  . Physical activity:    Days per week: Not on file    Minutes per session: Not on file  . Stress: Not on  file  Relationships  . Social connections:    Talks on phone: Not on file    Gets together: Not on file    Attends religious service: Not on file    Active member of club or organization: Not on file    Attends meetings of clubs or organizations: Not on file    Relationship status: Not on file  . Intimate partner violence:    Fear of current or ex partner: Not on file    Emotionally abused: Not on file    Physically abused: Not on file    Forced sexual activity: Not on file  Other Topics Concern  . Not on file  Social History Narrative  . Not on file    FAMILY HISTORY: Family History  Problem Relation Age of Onset  . Heart disease Mother   . Hypertension Mother    . Alzheimer's disease Mother   . Diabetes Mother   . Stroke Mother   . Anxiety disorder Mother   . Diabetes Father   . Alcohol abuse Father   . Bladder Cancer Sister   . Thyroid disease Brother   . Diabetes Daughter   . Diabetes Maternal Aunt     ALLERGIES:  has No Known Allergies.  MEDICATIONS:  Current Outpatient Medications  Medication Sig Dispense Refill  . apixaban (ELIQUIS) 5 MG TABS tablet Take 1 tablet (5 mg total) by mouth 2 (two) times daily. Start after 6 days. 60 tablet 0  . Biotin 10 MG TABS Take 1 tablet by mouth daily.    . Blood Glucose Monitoring Suppl (ONE TOUCH ULTRA MINI) W/DEVICE KIT See admin instructions.  0  . dapagliflozin propanediol (FARXIGA) 5 MG TABS tablet Take 5 mg by mouth daily.    Marland Kitchen dicyclomine (BENTYL) 10 MG capsule Take 10 mg by mouth 4 (four) times daily -  before meals and at bedtime.    . gabapentin (NEURONTIN) 300 MG capsule Take 1 cap every 12 hours  0  . glimepiride (AMARYL) 2 MG tablet Take 1 tablet by mouth daily.    Marland Kitchen glucose blood test strip 1 each by Other route as needed for other. Use as instructed    . levothyroxine (SYNTHROID, LEVOTHROID) 175 MCG tablet Take 1 tablet by mouth daily.  1  . lisinopril (PRINIVIL,ZESTRIL) 40 MG tablet Take 40 mg by mouth daily.    Marland Kitchen morphine (MS CONTIN) 15 MG 12 hr tablet Take 1 tablet (15 mg total) by mouth every 12 (twelve) hours. 30 tablet 0  . pantoprazole (PROTONIX) 40 MG tablet Take 40 mg by mouth daily.  5  . ranitidine (ZANTAC) 300 MG tablet Take 1 tablet by mouth daily.  1  . rosuvastatin (CRESTOR) 40 MG tablet Take 40 mg by mouth.     . sitaGLIPtin (JANUVIA) 100 MG tablet Take 100 mg by mouth daily.    . Vitamin D, Ergocalciferol, (DRISDOL) 50000 units CAPS capsule Take 1 capsule once a week by mouth.     No current facility-administered medications for this visit.       Marland Kitchen  PHYSICAL EXAMINATION:   Vitals:   02/23/18 1133  BP: 129/80  Pulse: 75  Resp: (!) 22  Temp: 97.9 F (36.6  C)   Filed Weights   02/23/18 1133  Weight: 178 lb (80.7 kg)    GENERAL: Well-nourished well-developed; Alert, no distress and comfortable.   Accompanied by granddaughter.; wakling with cane.Marland Kitchen  EYES: no pallor or icterus OROPHARYNX: no thrush or ulceration; good  dentition  NECK: supple, no masses felt LYMPH:  no palpable lymphadenopathy in the cervical, axillary or inguinal regions LUNGS: clear to auscultation and  No wheeze or crackles HEART/CVS: regular rate & rhythm and no murmurs; mild swelling of the legs noted. ABDOMEN: abdomen soft, non-tender and normal bowel sounds Musculoskeletal:no cyanosis of digits and no clubbing;  tenderness noted at the costovertebral junction on deep palpation. PSYCH: alert & oriented x 3 with fluent speech NEURO: no focal motor/sensory deficits SKIN:  no rashes or significant lesions  LABORATORY DATA:  Baker have reviewed the data as listed Lab Results  Component Value Date   WBC 7.8 02/23/2018   HGB 14.2 02/23/2018   HCT 42.1 02/23/2018   MCV 87.4 02/23/2018   PLT 250 02/23/2018   Recent Labs    07/21/17 1035 02/23/18 1105  NA 141 140  K 3.8 4.0  CL 108 108  CO2 25 23  GLUCOSE 118* 147*  BUN 11 13  CREATININE 0.58 0.58  CALCIUM 9.1 9.1  GFRNONAA >60 >60  GFRAA >60 >60  PROT 8.1 7.9  ALBUMIN 4.0 4.0  AST 23 22  ALT 23 25  ALKPHOS 75 73  BILITOT 0.4 0.4    RADIOGRAPHIC STUDIES: Baker have personally reviewed the radiological images as listed and agreed with the findings in the report. No results found.  ASSESSMENT & PLAN:   Pulmonary embolism, bilateral ([place])  # BIL PE-moderate to large clot burden without right heart strain. Patient currently on Eliquis tolerating it well.  Stable.  Recommend lifelong anticoagulation because patient's limited mobility.  Recommend explantation of the IVC filter.  #  Right lower extremity postphlebitic syndrome-worsened.  Recommend stockings.  #Sharp chest pains musculoskeletal  costochondritis/fibromyalgia-worsen.  Follow-up with PCP/rheumatology.   #History of thyroid cancer-stable.  On Synthroid follows up with endocrinology.  # refert o Dr.Schenier- to take out IVC filter;   # follow up in 6 months/labs      Cammie Sickle, MD 03/01/2018 5:25 PM

## 2018-02-23 NOTE — Assessment & Plan Note (Addendum)
#   BIL PE-moderate to large clot burden without right heart strain. Patient currently on Eliquis tolerating it well.  Stable.  Recommend lifelong anticoagulation because patient's limited mobility.  Recommend explantation of the IVC filter.  #  Right lower extremity postphlebitic syndrome-worsened.  Recommend stockings.  #Sharp chest pains musculoskeletal costochondritis/fibromyalgia-worsen.  Follow-up with PCP/rheumatology.   #History of thyroid cancer-stable.  On Synthroid follows up with endocrinology.  # refert o Dr.Schenier- to take out IVC filter;   # follow up in 6 months/labs

## 2018-03-09 ENCOUNTER — Encounter (INDEPENDENT_AMBULATORY_CARE_PROVIDER_SITE_OTHER): Payer: Self-pay | Admitting: Vascular Surgery

## 2018-03-09 ENCOUNTER — Ambulatory Visit (INDEPENDENT_AMBULATORY_CARE_PROVIDER_SITE_OTHER): Payer: Medicare Other | Admitting: Vascular Surgery

## 2018-03-09 VITALS — BP 136/77 | HR 82 | Resp 16 | Ht <= 58 in | Wt 180.0 lb

## 2018-03-09 DIAGNOSIS — E119 Type 2 diabetes mellitus without complications: Secondary | ICD-10-CM

## 2018-03-09 DIAGNOSIS — I890 Lymphedema, not elsewhere classified: Secondary | ICD-10-CM

## 2018-03-09 DIAGNOSIS — I10 Essential (primary) hypertension: Secondary | ICD-10-CM

## 2018-03-09 DIAGNOSIS — E782 Mixed hyperlipidemia: Secondary | ICD-10-CM

## 2018-03-09 DIAGNOSIS — I2699 Other pulmonary embolism without acute cor pulmonale: Secondary | ICD-10-CM | POA: Diagnosis not present

## 2018-03-09 DIAGNOSIS — I87009 Postthrombotic syndrome without complications of unspecified extremity: Secondary | ICD-10-CM

## 2018-03-09 NOTE — Progress Notes (Signed)
MRN : [id number]  Katie Baker is a 57 y.o. (13-May-1961) female who presents with chief complaint of  Chief Complaint  Patient presents with  . Follow-up    discuss ivc removal  .  History of Present Illness:    The patient presents to the office for evaluation of DVT.  DVT was identified at [facility] by Duplex ultrasound.  The initial symptoms were pain and swelling in the lower extremity.  The patient notes the leg continues to be very painful with dependency and swells quite a bite.  Symptoms are much better with elevation.  The patient notes minimal edema in the morning which steadily worsens throughout the day.    The patient has not been using compression therapy at this point.  No SOB or pleuritic chest pains.  No cough or hemoptysis.  No blood per rectum or blood in any sputum.  No excessive bruising per the patient.   PROCEDURE : 1.   Ultrasound guidance for vascular access to the right common femoral vein vein 2.   Catheter placement into the inferior vena cava 3.   Inferior venacavogram 4.   Placement of a [place] IVC filter    Current Meds  Medication Sig  . apixaban (ELIQUIS) 5 MG TABS tablet Take 1 tablet (5 mg total) by mouth 2 (two) times daily. Start after 6 days.  . Biotin 10 MG TABS Take 1 tablet by mouth daily.  . Blood Glucose Monitoring Suppl (ONE TOUCH ULTRA MINI) W/DEVICE KIT See admin instructions.  . dapagliflozin propanediol (FARXIGA) 5 MG TABS tablet Take 5 mg by mouth daily.  Marland Kitchen dicyclomine (BENTYL) 10 MG capsule Take 10 mg by mouth 4 (four) times daily -  before meals and at bedtime.  . gabapentin (NEURONTIN) 300 MG capsule Take 1 cap every 12 hours  . glimepiride (AMARYL) 2 MG tablet Take 1 tablet by mouth daily.  Marland Kitchen glucose blood test strip 1 each by Other route as needed for other. Use as instructed  . levothyroxine (SYNTHROID, LEVOTHROID) 175 MCG tablet Take 1 tablet by mouth daily.  Marland Kitchen lisinopril (PRINIVIL,ZESTRIL) 40 MG tablet Take 40 mg by  mouth daily.  Marland Kitchen morphine (MS CONTIN) 15 MG 12 hr tablet Take 1 tablet (15 mg total) by mouth every 12 (twelve) hours.  . pantoprazole (PROTONIX) 40 MG tablet Take 40 mg by mouth daily.  . ranitidine (ZANTAC) 300 MG tablet Take 1 tablet by mouth daily.  . rosuvastatin (CRESTOR) 40 MG tablet Take 40 mg by mouth.   . sitaGLIPtin (JANUVIA) 100 MG tablet Take 100 mg by mouth daily.  . Vitamin D, Ergocalciferol, (DRISDOL) 50000 units CAPS capsule Take 1 capsule once a week by mouth.    Past Medical History:  Diagnosis Date  . Deep vein thrombosis (DVT) ([place])   . Diabetes mellitus, type II ([place])   . Dislocation of hip, congenital   . Fibromyalgia   . Hearing loss    left ear  . Hyperlipidemia   . Hypertension   . Hypothyroidism   . Obesity   . Prediabetes   . Pulmonary embolism ([place]) 01/2017  . Reflux   . Sleep apnea   . Spinal stenosis   . Tendonitis   . Thyroid cancer [facility])     Past Surgical History:  Procedure Laterality Date  . COLONOSCOPY WITH PROPOFOL N/A 02/09/2015   Procedure: COLONOSCOPY WITH PROPOFOL;  Surgeon: [name], MD;  Location: [facility] ENDOSCOPY;  Service: Endoscopy;  Laterality: N/A;  .  ESOPHAGOGASTRODUODENOSCOPY N/A 02/09/2015   Procedure: ESOPHAGOGASTRODUODENOSCOPY (EGD);  Surgeon: [name], MD;  Location: [facility] ENDOSCOPY;  Service: Endoscopy;  Laterality: N/A;  . HIP SURGERY    . IVC FILTER INSERTION N/A 02/14/2017   Procedure: IVC Filter Insertion;  Surgeon: Katha Cabal, MD;  Location: [place] CV LAB;  Service: Cardiovascular;  Laterality: N/A;  . THYROID SURGERY      Social History Social History   Tobacco Use  . Smoking status: Never Smoker  . Smokeless tobacco: Never Used  Substance Use Topics  . Alcohol use: No    Alcohol/week: 0.0 oz  . Drug use: No    Family History Family History  Problem Relation Age of Onset  . Heart disease Mother   . Hypertension Mother   . Alzheimer's disease Mother   . Diabetes Mother     . Stroke Mother   . Anxiety disorder Mother   . Diabetes Father   . Alcohol abuse Father   . Bladder Cancer Sister   . Thyroid disease Brother   . Diabetes Daughter   . Diabetes Maternal Aunt     No Known Allergies   REVIEW OF SYSTEMS (Negative unless checked)  Constitutional: [] Weight loss  [] Fever  [] Chills Cardiac: [] Chest pain   [] Chest pressure   [] Palpitations   [] Shortness of breath when laying flat   [] Shortness of breath with exertion. Vascular:  [] Pain in legs with walking   [x] Pain in legs at rest  [x] History of DVT   [] Phlebitis   [x] Swelling in legs   [] Varicose veins   [] Non-healing ulcers Pulmonary:   [] Uses home oxygen   [] Productive cough   [] Hemoptysis   [] Wheeze  [] COPD   [] Asthma Neurologic:  [] Dizziness   [] Seizures   [] History of stroke   [] History of TIA  [] Aphasia   [] Vissual changes   [] Weakness or numbness in arm   [] Weakness or numbness in leg Musculoskeletal:   [] Joint swelling   [] Joint pain   [] Low back pain Hematologic:  [] Easy bruising  [] Easy bleeding   [] Hypercoagulable state   [] Anemic Gastrointestinal:  [] Diarrhea   [] Vomiting  [] Gastroesophageal reflux/heartburn   [] Difficulty swallowing. Genitourinary:  [] Chronic kidney disease   [] Difficult urination  [] Frequent urination   [] Blood in urine Skin:  [] Rashes   [] Ulcers  Psychological:  [] History of anxiety   []  History of major depression.  Physical Examination  Vitals:   03/09/18 1515  BP: 136/77  Pulse: 82  Resp: 16  Weight: 180 lb (81.6 kg)  Height: 4' 8"  (1.422 m)   Body mass index is 40.36 kg/m. Gen: WD/WN, NAD Head: [place]/AT, No temporalis wasting.  Ear/Nose/Throat: Hearing grossly intact, nares w/o erythema or drainage Eyes: PER, EOMI, sclera nonicteric.  Neck: Supple, no large masses.   Pulmonary:  Good air movement, no audible wheezing bilaterally, no use of accessory muscles.  Cardiac: RRR, no JVD Vascular: scattered varicosities present bilaterally.  Mild venous stasis  changes to the legs bilaterally.  3-4+ soft pitting edema Vessel Right Left  Radial Palpable Palpable  PT Palpable Palpable  DP Palpable Palpable  Gastrointestinal: Non-distended. No guarding/no peritoneal signs.  Musculoskeletal: M/S 5/5 throughout.  No deformity or atrophy.  Neurologic: CN 2-12 intact. Symmetrical.  Speech is fluent. Motor exam as listed above. Psychiatric: Judgment intact, Mood & affect appropriate for pt's clinical situation. Dermatologic: No rashes or ulcers noted.  No changes consistent with cellulitis. Lymph : No lichenification or skin changes of chronic lymphedema.  CBC Lab Results  Component Value Date   WBC 7.8 02/23/2018   HGB 14.2 02/23/2018   HCT 42.1 02/23/2018   MCV 87.4 02/23/2018   PLT 250 02/23/2018    BMET    Component Value Date/Time   NA 140 02/23/2018 1105   NA 141 10/01/2011 0721   K 4.0 02/23/2018 1105   K 3.6 10/01/2011 0721   CL 108 02/23/2018 1105   CL 104 10/01/2011 0721   CO2 23 02/23/2018 1105   CO2 28 10/01/2011 0721   GLUCOSE 147 (H) 02/23/2018 1105   GLUCOSE 111 (H) 10/01/2011 0721   BUN 13 02/23/2018 1105   BUN 10 10/01/2011 0721   CREATININE 0.58 02/23/2018 1105   CREATININE 0.51 (L) 10/01/2011 0721   CALCIUM 9.1 02/23/2018 1105   CALCIUM 9.1 10/01/2011 0721   GFRNONAA >60 02/23/2018 1105   GFRNONAA >60 10/01/2011 0721   GFRAA >60 02/23/2018 1105   GFRAA >60 10/01/2011 0721   Estimated Creatinine Clearance: 65.8 mL/min (by C-G formula based on SCr of 0.58 mg/dL).  COAG Lab Results  Component Value Date   INR 0.96 02/12/2017    Radiology No results found.    Assessment/Plan 1. Post-phlebitic syndrome The patient will continue anticoagulation for now as there have not been any problems or complications at this point.  IVC filter will be removed.  Risk and benefits were reviewed the patient.  Indications for the procedure were reviewed.  All questions were answered, the patient agrees to proceed.   I  have had a long discussion with the patient regarding DVT and post phlebitic changes such as swelling and why it  causes symptoms such as pain.  The patient will wear graduated compression stockings class 1 (20-30 mmHg) on a daily basis a prescription was given. The patient will  beginning wearing the stockings first thing in the morning and removing them in the evening. The patient is instructed specifically not to sleep in the stockings.  In addition, behavioral modification including elevation during the day and avoidance of prolonged dependency will be initiated.    The patient will follow-up with me after removal (this was also discussed today and the patient agrees with the plan to have the filter removed).    A total of 30 minutes was spent with this patient and greater than 50% was spent in counseling and coordination of care with the patient.  Discussion included the treatment options for vascular disease including indications for surgery and intervention.  Also discussed is the appropriate timing of treatment.  In addition medical therapy was discussed.  2. Lymphedema Recommend:  No surgery or intervention at this point in time.    I have reviewed my previous discussion with the patient regarding swelling and why it causes symptoms.  Patient will continue wearing graduated compression stockings class 1 (20-30 mmHg) on a daily basis. The patient will  beginning wearing the stockings first thing in the morning and removing them in the evening. The patient is instructed specifically not to sleep in the stockings.    In addition, behavioral modification including several periods of elevation of the lower extremities during the day will be continued.  This was reviewed with the patient during the initial visit.  The patient will also continue routine exercise, especially walking on a daily basis as was discussed during the initial visit.    Despite conservative treatments including graduated  compression therapy class 1 and behavioral modification including exercise and elevation the patient  has not obtained adequate control  of the lymphedema.  The patient still has stage 3 lymphedema and therefore, I believe that a lymph pump should be added to improve the control of the patient's lymphedema.  Additionally, a lymph pump is warranted because it will reduce the risk of cellulitis and ulceration in the future.  Patient should follow-up in six months    3. Pulmonary embolism, bilateral (HCC) Continue anticoagulation  4. Essential hypertension Continue antihypertensive medications as already ordered, these medications have been reviewed and there are no changes at this time.   5. Type 2 diabetes mellitus without complication, without long-term current use of insulin (HCC) Continue hypoglycemic medications as already ordered, these medications have been reviewed and there are no changes at this time.  Hgb A1C to be monitored as already arranged by primary service   6. Mixed hyperlipidemia Continue statin as ordered and reviewed, no changes at this time    Hortencia Pilar, MD  03/09/2018 3:38 PM

## 2018-03-11 ENCOUNTER — Encounter (INDEPENDENT_AMBULATORY_CARE_PROVIDER_SITE_OTHER): Payer: Self-pay

## 2018-03-11 ENCOUNTER — Telehealth (INDEPENDENT_AMBULATORY_CARE_PROVIDER_SITE_OTHER): Payer: Self-pay

## 2018-03-11 NOTE — Telephone Encounter (Signed)
I have left two messages for the patient to return my call to schedule her IVC filter removal. I have scheduled the removal and mailed the information to the patient.

## 2018-03-14 ENCOUNTER — Encounter (INDEPENDENT_AMBULATORY_CARE_PROVIDER_SITE_OTHER): Payer: Self-pay | Admitting: Vascular Surgery

## 2018-03-14 DIAGNOSIS — I87009 Postthrombotic syndrome without complications of unspecified extremity: Secondary | ICD-10-CM | POA: Insufficient documentation

## 2018-03-14 DIAGNOSIS — I890 Lymphedema, not elsewhere classified: Secondary | ICD-10-CM | POA: Insufficient documentation

## 2018-03-16 ENCOUNTER — Other Ambulatory Visit (INDEPENDENT_AMBULATORY_CARE_PROVIDER_SITE_OTHER): Payer: Self-pay | Admitting: Vascular Surgery

## 2018-03-23 MED ORDER — CEFAZOLIN SODIUM-DEXTROSE 2-4 GM/100ML-% IV SOLN
2.0000 g | Freq: Once | INTRAVENOUS | Status: AC
  Administered 2018-03-24: 2 g via INTRAVENOUS

## 2018-03-24 ENCOUNTER — Ambulatory Visit
Admission: RE | Admit: 2018-03-24 | Discharge: 2018-03-24 | Disposition: A | Discharge status: Home / Self Care | Payer: Medicare Other | Source: Ambulatory Visit | Attending: Vascular Surgery | Admitting: Vascular Surgery

## 2018-03-24 ENCOUNTER — Encounter: Payer: Self-pay | Admitting: *Deleted

## 2018-03-24 ENCOUNTER — Encounter
Admission: RE | Disposition: A | Discharge status: Home / Self Care | Payer: Self-pay | Source: Ambulatory Visit | Attending: Vascular Surgery

## 2018-03-24 ENCOUNTER — Ambulatory Visit: Payer: Medicare Other

## 2018-03-24 DIAGNOSIS — E039 Hypothyroidism, unspecified: Secondary | ICD-10-CM | POA: Diagnosis not present

## 2018-03-24 DIAGNOSIS — I87009 Postthrombotic syndrome without complications of unspecified extremity: Secondary | ICD-10-CM | POA: Insufficient documentation

## 2018-03-24 DIAGNOSIS — G4730 Sleep apnea, unspecified: Secondary | ICD-10-CM | POA: Insufficient documentation

## 2018-03-24 DIAGNOSIS — Z833 Family history of diabetes mellitus: Secondary | ICD-10-CM | POA: Insufficient documentation

## 2018-03-24 DIAGNOSIS — I2699 Other pulmonary embolism without acute cor pulmonale: Secondary | ICD-10-CM | POA: Insufficient documentation

## 2018-03-24 DIAGNOSIS — M797 Fibromyalgia: Secondary | ICD-10-CM | POA: Diagnosis not present

## 2018-03-24 DIAGNOSIS — I10 Essential (primary) hypertension: Secondary | ICD-10-CM | POA: Insufficient documentation

## 2018-03-24 DIAGNOSIS — E119 Type 2 diabetes mellitus without complications: Secondary | ICD-10-CM | POA: Diagnosis not present

## 2018-03-24 DIAGNOSIS — K219 Gastro-esophageal reflux disease without esophagitis: Secondary | ICD-10-CM | POA: Insufficient documentation

## 2018-03-24 DIAGNOSIS — E782 Mixed hyperlipidemia: Secondary | ICD-10-CM | POA: Diagnosis not present

## 2018-03-24 DIAGNOSIS — Z9889 Other specified postprocedural states: Secondary | ICD-10-CM | POA: Diagnosis not present

## 2018-03-24 DIAGNOSIS — H9192 Unspecified hearing loss, left ear: Secondary | ICD-10-CM | POA: Insufficient documentation

## 2018-03-24 DIAGNOSIS — Z86718 Personal history of other venous thrombosis and embolism: Secondary | ICD-10-CM | POA: Diagnosis not present

## 2018-03-24 DIAGNOSIS — Z85850 Personal history of malignant neoplasm of thyroid: Secondary | ICD-10-CM | POA: Diagnosis not present

## 2018-03-24 DIAGNOSIS — Z8349 Family history of other endocrine, nutritional and metabolic diseases: Secondary | ICD-10-CM | POA: Diagnosis not present

## 2018-03-24 DIAGNOSIS — Z8249 Family history of ischemic heart disease and other diseases of the circulatory system: Secondary | ICD-10-CM | POA: Diagnosis not present

## 2018-03-24 DIAGNOSIS — Z6841 Body Mass Index (BMI) 40.0 and over, adult: Secondary | ICD-10-CM | POA: Insufficient documentation

## 2018-03-24 DIAGNOSIS — Z452 Encounter for adjustment and management of vascular access device: Secondary | ICD-10-CM | POA: Diagnosis present

## 2018-03-24 DIAGNOSIS — Z823 Family history of stroke: Secondary | ICD-10-CM | POA: Diagnosis not present

## 2018-03-24 DIAGNOSIS — E669 Obesity, unspecified: Secondary | ICD-10-CM | POA: Diagnosis not present

## 2018-03-24 DIAGNOSIS — I82409 Acute embolism and thrombosis of unspecified deep veins of unspecified lower extremity: Secondary | ICD-10-CM

## 2018-03-24 DIAGNOSIS — Z8052 Family history of malignant neoplasm of bladder: Secondary | ICD-10-CM | POA: Insufficient documentation

## 2018-03-24 DIAGNOSIS — M79603 Pain in arm, unspecified: Secondary | ICD-10-CM

## 2018-03-24 HISTORY — PX: IVC FILTER REMOVAL: CATH118246

## 2018-03-24 LAB — GLUCOSE, CAPILLARY: Glucose-Capillary: 101 mg/dL — ABNORMAL HIGH (ref 70–99)

## 2018-03-24 SURGERY — IVC FILTER REMOVAL
Anesthesia: Moderate Sedation

## 2018-03-24 MED ORDER — LIDOCAINE HCL (PF) 1 % IJ SOLN
INTRAMUSCULAR | Status: AC
  Filled 2018-03-24: qty 30

## 2018-03-24 MED ORDER — DIPHENHYDRAMINE HCL 50 MG/ML IJ SOLN
INTRAMUSCULAR | Status: DC | PRN
  Administered 2018-03-24: 25 mg via INTRAVENOUS

## 2018-03-24 MED ORDER — MIDAZOLAM HCL 2 MG/2ML IJ SOLN
INTRAMUSCULAR | Status: DC | PRN
  Administered 2018-03-24: 2 mg via INTRAVENOUS
  Administered 2018-03-24: 1 mg via INTRAVENOUS

## 2018-03-24 MED ORDER — HEPARIN (PORCINE) IN NACL 1000-0.9 UT/500ML-% IV SOLN
INTRAVENOUS | Status: AC
  Filled 2018-03-24: qty 500

## 2018-03-24 MED ORDER — SODIUM CHLORIDE 0.9 % IV SOLN
INTRAVENOUS | Status: DC
  Administered 2018-03-24: 12:00:00 via INTRAVENOUS

## 2018-03-24 MED ORDER — IOPAMIDOL (ISOVUE-300) INJECTION 61%
INTRAVENOUS | Status: DC | PRN
  Administered 2018-03-24: 15 mL via INTRAVENOUS

## 2018-03-24 MED ORDER — FENTANYL CITRATE (PF) 100 MCG/2ML IJ SOLN
INTRAMUSCULAR | Status: AC
  Filled 2018-03-24: qty 2

## 2018-03-24 MED ORDER — CEFAZOLIN SODIUM-DEXTROSE 2-4 GM/100ML-% IV SOLN
INTRAVENOUS | Status: AC
  Administered 2018-03-24: 2 g via INTRAVENOUS
  Filled 2018-03-24: qty 100

## 2018-03-24 MED ORDER — DIPHENHYDRAMINE HCL 50 MG/ML IJ SOLN
INTRAMUSCULAR | Status: AC
  Filled 2018-03-24: qty 1

## 2018-03-24 MED ORDER — FENTANYL CITRATE (PF) 100 MCG/2ML IJ SOLN
INTRAMUSCULAR | Status: DC | PRN
  Administered 2018-03-24 (×2): 50 ug via INTRAVENOUS

## 2018-03-24 MED ORDER — MIDAZOLAM HCL 5 MG/5ML IJ SOLN
INTRAMUSCULAR | Status: AC
  Filled 2018-03-24: qty 5

## 2018-03-24 SURGICAL SUPPLY — 6 items
GLIDECATH NONTAPER ANGL 5FR (CATHETERS) ×3 IMPLANT
NEEDLE ENTRY 21GA 7CM ECHOTIP (NEEDLE) ×3 IMPLANT
PACK ANGIOGRAPHY (CUSTOM PROCEDURE TRAY) ×3 IMPLANT
SET INTRO CAPELLA COAXIAL (SET/KITS/TRAYS/PACK) ×3 IMPLANT
SET VENACAVA FILTER RETRIEVAL (MISCELLANEOUS) ×3 IMPLANT
WIRE J 3MM .035X145CM (WIRE) ×3 IMPLANT

## 2018-03-24 NOTE — Op Note (Signed)
  OPERATIVE NOTE   PRE-OPERATIVE DIAGNOSIS: DVT with PE  POST-OPERATIVE DIAGNOSIS: Same  PROCEDURE: 1. Retrieval of IVC Filter 2. Inferior Vena Cavagram  SURGEON: Katha Cabal, M.D.  ANESTHESIA:  Conscious sedation was administered under my direct supervision by the interventional radiology RN. IV Versed plus fentanyl were utilized. Continuous ECG, pulse oximetry and blood pressure was monitored throughout the entire procedure. Conscious sedation was for a total of 25 minutes.  ESTIMATED BLOOD LOSS: Minimal cc  FINDING(S):inferior vena cava is widely patent filter is in place in good position. Filter is removed without incident  SPECIMEN(S):  IVC filter intact  INDICATIONS:   [name] is a 57 y.o. female who presents with DVT and PE. The patient has now tolerated anticoagulation for several months. Therefore, the IVC filter is recommended to be removed. The risks and benefits were reviewed with the patient all questions were answered and they agreed to proceed with IVC filter retrieval. Oral anticoagulation will be continued.  DESCRIPTION: After obtaining full informed written consent, the patient was brought back to the Special Procedure Suite and placed in the supine position.  The patient received IV antibiotics prior to induction.  After obtaining adequate sedation, the patient was prepped and draped in the standard fashion and appropriate time out is called.     Ultrasound was placed in a sterile sleeve.The right neck was then imaged with ultrasound.   Jugular vein was identified it is echolucent and homogeneous indicating patency. 1% lidocaine is then infiltrated under ultrasound visualization and subsequently a Seldinger needle is inserted under real-time ultrasound guidance.  J-wire is then advanced into the inferior vena cava under fluoroscopic guidance. With the tip of the sheath at the confluence of the iliac veins inferior vena caval imaging is performed.  After  review of the image the sheath is repositioned to above the filter and the snares introduced. Snares opened and the hook is secured without difficulty. The filter is then collapsed within the sheath and removed without difficulty.  Sheath is removed by pressures held the patient tolerated the procedure well and there were no immediate complications.  Interpretation: inferior vena cava is widely patent filter is in place in good position. Filter is removed without incident.     COMPLICATIONS: None  CONDITION: Carlynn Purl, M.D. [facility] Office: [phone]   03/24/2018, 1:19 PM

## 2018-03-24 NOTE — H&P (Signed)
[facility] History & Physical Update  The patient was interviewed and re-examined.  The patient's previous History and Physical has been reviewed and is unchanged.  There is no change in the plan of care. We plan to proceed with the scheduled procedure.  Hortencia Pilar, MD  03/24/2018, 12:05 PM

## 2018-03-24 NOTE — Progress Notes (Signed)
Patient to ultrasound, plan for discharge after ultrasound.

## 2018-04-13 ENCOUNTER — Ambulatory Visit (INDEPENDENT_AMBULATORY_CARE_PROVIDER_SITE_OTHER): Payer: Medicare Other | Admitting: Vascular Surgery

## 2018-04-13 ENCOUNTER — Encounter (INDEPENDENT_AMBULATORY_CARE_PROVIDER_SITE_OTHER): Payer: Self-pay | Admitting: Vascular Surgery

## 2018-04-13 VITALS — BP 131/74 | HR 79 | Resp 16 | Ht <= 58 in | Wt 179.8 lb

## 2018-04-13 DIAGNOSIS — I10 Essential (primary) hypertension: Secondary | ICD-10-CM

## 2018-04-13 DIAGNOSIS — I890 Lymphedema, not elsewhere classified: Secondary | ICD-10-CM

## 2018-04-13 DIAGNOSIS — E119 Type 2 diabetes mellitus without complications: Secondary | ICD-10-CM

## 2018-04-13 DIAGNOSIS — I87009 Postthrombotic syndrome without complications of unspecified extremity: Secondary | ICD-10-CM

## 2018-04-13 NOTE — Progress Notes (Signed)
MRN : [id number]  Katie Baker is a 57 y.o. (11/20/1960) female who presents with chief complaint of  Chief Complaint  Patient presents with  . Follow-up    [facility] 2week   .  History of Present Illness:   She is s/p: 1. Retrieval of IVC Filter 2. Inferior Vena Cavagram  The patient is concerned about her leg swelling.  The swelling has increased and is associated with pain. There have not been any interval development of a ulcerations or wounds.  Since the previous visit the patient has not been wearing graduated compression stockings. The patient has been using compression routinely morning until night.  The patient also states elevation during the day and exercise is being done too.   Current Meds  Medication Sig  . acetaminophen (TYLENOL) 500 MG tablet Take 1,000 mg by mouth every 6 (six) hours as needed for moderate pain or headache.  Marland Kitchen apixaban (ELIQUIS) 5 MG TABS tablet Take 1 tablet (5 mg total) by mouth 2 (two) times daily. Start after 6 days. (Patient taking differently: Take 5 mg by mouth 2 (two) times daily. )  . Biotin 10 MG TABS Take 10 mg by mouth daily.   . dapagliflozin propanediol (FARXIGA) 5 MG TABS tablet Take 5 mg by mouth daily.  . diazepam (VALIUM) 5 MG tablet Take 5 mg by mouth at bedtime.  . dicyclomine (BENTYL) 10 MG capsule Take 10 mg by mouth every 6 (six) hours.   . gabapentin (NEURONTIN) 300 MG capsule Take 300 mg by mouth 2 (two) times daily.  Marland Kitchen glimepiride (AMARYL) 2 MG tablet Take 2 mg by mouth daily with breakfast.   . levothyroxine (SYNTHROID, LEVOTHROID) 175 MCG tablet Take 175 mcg by mouth daily before breakfast.   . lidocaine (LMX) 4 % cream Apply 1 application topically daily as needed (for pain).  Marland Kitchen lisinopril (PRINIVIL,ZESTRIL) 40 MG tablet Take 40 mg by mouth daily.  Marland Kitchen morphine (MS CONTIN) 15 MG 12 hr tablet Take 1 tablet (15 mg total) by mouth every 12 (twelve) hours.  . pantoprazole (PROTONIX) 40 MG tablet Take 40 mg by mouth 2 (two)  times daily.   . ranitidine (ZANTAC) 300 MG tablet Take 300 mg by mouth daily with supper.   . rosuvastatin (CRESTOR) 40 MG tablet Take 40 mg by mouth daily.   . sitaGLIPtin (JANUVIA) 100 MG tablet Take 100 mg by mouth daily.  . Vitamin D, Ergocalciferol, (DRISDOL) 50000 units CAPS capsule Take 50,000 Units by mouth every Wednesday.     Past Medical History:  Diagnosis Date  . Deep vein thrombosis (DVT) ([place])   . Diabetes mellitus, type II ([place])   . Dislocation of hip, congenital   . Fibromyalgia   . Hearing loss    left ear  . Hyperlipidemia   . Hypertension   . Hypothyroidism   . Obesity   . Prediabetes   . Pulmonary embolism ([place]) 01/2017  . Reflux   . Sleep apnea   . Spinal stenosis   . Tendonitis   . Thyroid cancer [facility])     Past Surgical History:  Procedure Laterality Date  . COLONOSCOPY WITH PROPOFOL N/A 02/09/2015   Procedure: COLONOSCOPY WITH PROPOFOL;  Surgeon: [name], MD;  Location: [facility] ENDOSCOPY;  Service: Endoscopy;  Laterality: N/A;  . ESOPHAGOGASTRODUODENOSCOPY N/A 02/09/2015   Procedure: ESOPHAGOGASTRODUODENOSCOPY (EGD);  Surgeon: [name], MD;  Location: [facility] ENDOSCOPY;  Service: Endoscopy;  Laterality: N/A;  . HIP SURGERY    .  IVC FILTER INSERTION N/A 02/14/2017   Procedure: IVC Filter Insertion;  Surgeon: Katha Cabal, MD;  Location: [place] CV LAB;  Service: Cardiovascular;  Laterality: N/A;  . IVC FILTER REMOVAL N/A 03/24/2018   Procedure: IVC FILTER REMOVAL;  Surgeon: Katha Cabal, MD;  Location: [place] CV LAB;  Service: Cardiovascular;  Laterality: N/A;  . LIMB SPARING RESECTION HIP W/ SADDLE JOINT REPLACEMENT Bilateral   . THYROID SURGERY    . TOTAL HIP REVISION      Social History Social History   Tobacco Use  . Smoking status: Never Smoker  . Smokeless tobacco: Never Used  Substance Use Topics  . Alcohol use: No    Alcohol/week: 0.0 standard drinks  . Drug use: No    Family History Family  History  Problem Relation Age of Onset  . Heart disease Mother   . Hypertension Mother   . Alzheimer's disease Mother   . Diabetes Mother   . Stroke Mother   . Anxiety disorder Mother   . Diabetes Father   . Alcohol abuse Father   . Bladder Cancer Sister   . Thyroid disease Brother   . Diabetes Daughter   . Diabetes Maternal Aunt     No Known Allergies   REVIEW OF SYSTEMS (Negative unless checked)  Constitutional: [] Weight loss  [] Fever  [] Chills Cardiac: [] Chest pain   [] Chest pressure   [] Palpitations   [] Shortness of breath when laying flat   [] Shortness of breath with exertion. Vascular:  [] Pain in legs with walking   [] Pain in legs at rest  [x] History of DVT   [] Phlebitis   [x] Swelling in legs   [] Varicose veins   [] Non-healing ulcers Pulmonary:   [] Uses home oxygen   [] Productive cough   [] Hemoptysis   [] Wheeze  [] COPD   [] Asthma Neurologic:  [] Dizziness   [] Seizures   [] History of stroke   [] History of TIA  [] Aphasia   [] Vissual changes   [] Weakness or numbness in arm   [] Weakness or numbness in leg Musculoskeletal:   [] Joint swelling   [x] Joint pain   [] Low back pain Hematologic:  [] Easy bruising  [] Easy bleeding   [] Hypercoagulable state   [] Anemic Gastrointestinal:  [] Diarrhea   [] Vomiting  [] Gastroesophageal reflux/heartburn   [] Difficulty swallowing. Genitourinary:  [] Chronic kidney disease   [] Difficult urination  [] Frequent urination   [] Blood in urine Skin:  [x] Rashes   [] Ulcers  Psychological:  [] History of anxiety   []  History of major depression.  Physical Examination  Vitals:   04/13/18 1054  BP: 131/74  Pulse: 79  Resp: 16  Weight: 179 lb 12.8 oz (81.6 kg)  Height: 4\' 8"  (1.422 m)   Body mass index is 40.31 kg/m. Gen: WD/WN, NAD Head: [place]/AT, No temporalis wasting.  Ear/Nose/Throat: Hearing grossly intact, nares w/o erythema or drainage Eyes: PER, EOMI, sclera nonicteric.  Neck: Supple, no large masses.   Pulmonary:  Good air movement, no audible  wheezing bilaterally, no use of accessory muscles.  Cardiac: RRR, no JVD Vascular: scattered varicosities present bilaterally.  Mild venous stasis changes to the legs bilaterally.  3+ soft pitting edema Vessel Right Left  Radial Palpable Palpable  PT Palpable Palpable  DP Palpable Palpable  Gastrointestinal: Non-distended. No guarding/no peritoneal signs.  Musculoskeletal: M/S 5/5 throughout.  No deformity or atrophy.  Neurologic: CN 2-12 intact. Symmetrical.  Speech is fluent. Motor exam as listed above. Psychiatric: Judgment intact, Mood & affect appropriate for pt's clinical situation. Dermatologic: Venous rashes no ulcers noted.  No changes consistent with cellulitis. Lymph : No lichenification or skin changes of chronic lymphedema.  CBC Lab Results  Component Value Date   WBC 7.8 02/23/2018   HGB 14.2 02/23/2018   HCT 42.1 02/23/2018   MCV 87.4 02/23/2018   PLT 250 02/23/2018    BMET    Component Value Date/Time   NA 140 02/23/2018 1105   NA 141 10/01/2011 0721   K 4.0 02/23/2018 1105   K 3.6 10/01/2011 0721   CL 108 02/23/2018 1105   CL 104 10/01/2011 0721   CO2 23 02/23/2018 1105   CO2 28 10/01/2011 0721   GLUCOSE 147 (H) 02/23/2018 1105   GLUCOSE 111 (H) 10/01/2011 0721   BUN 13 02/23/2018 1105   BUN 10 10/01/2011 0721   CREATININE 0.58 02/23/2018 1105   CREATININE 0.51 (L) 10/01/2011 0721   CALCIUM 9.1 02/23/2018 1105   CALCIUM 9.1 10/01/2011 0721   GFRNONAA >60 02/23/2018 1105   GFRNONAA >60 10/01/2011 0721   GFRAA >60 02/23/2018 1105   GFRAA >60 10/01/2011 0721   CrCl cannot be calculated (Patient's most recent lab result is older than the maximum 21 days allowed.).  COAG Lab Results  Component Value Date   INR 0.96 02/12/2017    Radiology US Venous Img Upper Bilat  Result Date: 03/24/2018 CLINICAL DATA:  History of DVT and pulmonary embolism and status post IVC filter removal earlier today. Complaint of bilateral arm pain with edema. EXAM:  BILATERAL UPPER EXTREMITY VENOUS DOPPLER ULTRASOUND TECHNIQUE: Gray-scale sonography with graded compression, as well as color Doppler and duplex ultrasound were performed to evaluate the bilateral upper extremity deep venous systems from the level of the subclavian vein and including the jugular, axillary, basilic, radial, ulnar and upper cephalic vein. Spectral Doppler was utilized to evaluate flow at rest and with distal augmentation maneuvers. COMPARISON:  None. FINDINGS: RIGHT UPPER EXTREMITY Internal Jugular Vein: No evidence of thrombus. Normal compressibility, respiratory phasicity and response to augmentation. Subclavian Vein: No evidence of thrombus. Normal compressibility, respiratory phasicity and response to augmentation. Axillary Vein: No evidence of thrombus. Normal compressibility, respiratory phasicity and response to augmentation. Cephalic Vein: No evidence of thrombus. Normal compressibility, respiratory phasicity and response to augmentation. Basilic Vein: No evidence of thrombus. Normal compressibility, respiratory phasicity and response to augmentation. Brachial Veins: No evidence of thrombus. Normal compressibility, respiratory phasicity and response to augmentation. Radial Veins: No evidence of thrombus. Normal compressibility, respiratory phasicity and response to augmentation. Ulnar Veins: No evidence of thrombus. Normal compressibility, respiratory phasicity and response to augmentation. Venous Reflux:  None. Other Findings: No evidence of superficial thrombophlebitis or abnormal fluid collection. LEFT UPPER EXTREMITY Internal Jugular Vein: No evidence of thrombus. Normal compressibility, respiratory phasicity and response to augmentation. Subclavian Vein: No evidence of thrombus. Normal compressibility, respiratory phasicity and response to augmentation. Axillary Vein: No evidence of thrombus. Normal compressibility, respiratory phasicity and response to augmentation. Cephalic Vein: No  evidence of thrombus. Normal compressibility, respiratory phasicity and response to augmentation. Basilic Vein: No evidence of thrombus. Normal compressibility, respiratory phasicity and response to augmentation. Brachial Veins: No evidence of thrombus. Normal compressibility, respiratory phasicity and response to augmentation. Radial Veins: No evidence of thrombus. Normal compressibility, respiratory phasicity and response to augmentation. Ulnar Veins: No evidence of thrombus. Normal compressibility, respiratory phasicity and response to augmentation. Venous Reflux:  None. Other Findings: No evidence of superficial thrombophlebitis or abnormal fluid collection. IMPRESSION: No evidence of deep venous thrombosis or superficial thrombophlebitis within either upper extremity. Electronically Signed  By: Aletta Edouard M.D.   On: 03/24/2018 15:58     Assessment/Plan 1. Lymphedema Recommend:  No surgery or intervention at this point in time.    I have reviewed my previous discussion with the patient regarding swelling and why it causes symptoms.  Patient will continue wearing graduated compression stockings class 1 (20-30 mmHg) on a daily basis. The patient will  beginning wearing the stockings first thing in the morning and removing them in the evening. The patient is instructed specifically not to sleep in the stockings.    In addition, behavioral modification including several periods of elevation of the lower extremities during the day will be continued.  This was reviewed with the patient during the initial visit.  The patient will also continue routine exercise, especially walking on a daily basis as was discussed during the initial visit.    Despite conservative treatments including graduated compression therapy class 1 and behavioral modification including exercise and elevation the patient  has not obtained adequate control of the lymphedema.  The patient still has stage 3 lymphedema and  therefore, I believe that a lymph pump should be added to improve the control of the patient's lymphedema.  Additionally, a lymph pump is warranted because it will reduce the risk of cellulitis and ulceration in the future.  Patient should follow-up in six months    2. Post-phlebitic syndrome Recommend:  No surgery or intervention at this point in time.    I have reviewed my previous discussion with the patient regarding swelling and why it causes symptoms.  Patient will continue wearing graduated compression stockings class 1 (20-30 mmHg) on a daily basis. The patient will  beginning wearing the stockings first thing in the morning and removing them in the evening. The patient is instructed specifically not to sleep in the stockings.    In addition, behavioral modification including several periods of elevation of the lower extremities during the day will be continued.  This was reviewed with the patient during the initial visit.  The patient will also continue routine exercise, especially walking on a daily basis as was discussed during the initial visit.    Despite conservative treatments including graduated compression therapy class 1 and behavioral modification including exercise and elevation the patient  has not obtained adequate control of the lymphedema.  The patient still has stage 3 lymphedema and therefore, I believe that a lymph pump should be added to improve the control of the patient's lymphedema.  Additionally, a lymph pump is warranted because it will reduce the risk of cellulitis and ulceration in the future.  Patient should follow-up in six months    3. Essential hypertension Continue antihypertensive medications as already ordered, these medications have been reviewed and there are no changes at this time.   4. Type 2 diabetes mellitus without complication, without long-term current use of insulin (HCC) Continue hypoglycemic medications as already ordered, these  medications have been reviewed and there are no changes at this time.  Hgb A1C to be monitored as already arranged by primary service     Hortencia Pilar, MD  04/13/2018 11:02 AM

## 2018-05-21 ENCOUNTER — Telehealth (INDEPENDENT_AMBULATORY_CARE_PROVIDER_SITE_OTHER): Payer: Self-pay

## 2018-05-21 NOTE — Telephone Encounter (Signed)
Patient called and stated that she is having leg pain and that she would like to request an appointment to rule out DVT, since she has previously had one.   Will need a DVT study to rule this out.  Please get with Einar Pheasant to see when he will have the time to get her in.

## 2018-05-22 ENCOUNTER — Emergency Department
Admission: EM | Admit: 2018-05-22 | Discharge: 2018-05-23 | Disposition: A | Discharge status: Home / Self Care | Payer: Medicare Other | Attending: Emergency Medicine | Admitting: Emergency Medicine

## 2018-05-22 ENCOUNTER — Other Ambulatory Visit: Payer: Self-pay

## 2018-05-22 ENCOUNTER — Emergency Department: Payer: Medicare Other

## 2018-05-22 ENCOUNTER — Other Ambulatory Visit (INDEPENDENT_AMBULATORY_CARE_PROVIDER_SITE_OTHER): Payer: Self-pay | Admitting: Vascular Surgery

## 2018-05-22 DIAGNOSIS — R0602 Shortness of breath: Secondary | ICD-10-CM | POA: Diagnosis not present

## 2018-05-22 DIAGNOSIS — R079 Chest pain, unspecified: Secondary | ICD-10-CM | POA: Diagnosis not present

## 2018-05-22 DIAGNOSIS — Z7901 Long term (current) use of anticoagulants: Secondary | ICD-10-CM | POA: Insufficient documentation

## 2018-05-22 DIAGNOSIS — M1711 Unilateral primary osteoarthritis, right knee: Secondary | ICD-10-CM | POA: Insufficient documentation

## 2018-05-22 DIAGNOSIS — Z79899 Other long term (current) drug therapy: Secondary | ICD-10-CM | POA: Insufficient documentation

## 2018-05-22 DIAGNOSIS — Z7984 Long term (current) use of oral hypoglycemic drugs: Secondary | ICD-10-CM | POA: Diagnosis not present

## 2018-05-22 DIAGNOSIS — E119 Type 2 diabetes mellitus without complications: Secondary | ICD-10-CM | POA: Insufficient documentation

## 2018-05-22 DIAGNOSIS — E039 Hypothyroidism, unspecified: Secondary | ICD-10-CM | POA: Insufficient documentation

## 2018-05-22 DIAGNOSIS — I82511 Chronic embolism and thrombosis of right femoral vein: Secondary | ICD-10-CM

## 2018-05-22 DIAGNOSIS — Z86711 Personal history of pulmonary embolism: Secondary | ICD-10-CM | POA: Insufficient documentation

## 2018-05-22 DIAGNOSIS — M25561 Pain in right knee: Secondary | ICD-10-CM | POA: Diagnosis present

## 2018-05-22 NOTE — ED Triage Notes (Signed)
Pt states has chest pain and right leg pain. Pt states history of pe and dvt. Pt states did have a venous filter for dvt that was removed. Pt states she does feel shob. Pt looks uncomfortable.

## 2018-05-22 NOTE — ED Provider Notes (Signed)
[facility] Emergency Department Provider Note  ____________________________________________   First MD Initiated Contact with Patient 05/22/18 2357     (approximate)  I have reviewed the triage vital signs and the nursing notes.   HISTORY  Chief Complaint Leg Pain and Chest Pain    HPI Katie Baker is a 57 y.o. female who self presents to the emergency department with several days of increasing shortness of breath and pleuritic chest pain.  Pain is sharp worse with taking a deep breath improved when not.  Nonexertional.  Is not ripping or tearing does not go straight to her back.  She is also had increasing pain in her right lower extremity particularly her knee.  She is concerned because she has had previous DVTs and pulmonary embolism.  She is anticoagulated with Eliquis and has not missed any doses aside from 3 days in July when she had her IVC filter removed.  No recent surgery travel or immobilization.  She can lie completely flat.  The pain in her knee is severe throbbing aching worse with movement improved with rest.    Past Medical History:  Diagnosis Date  . Deep vein thrombosis (DVT) ([place])   . Diabetes mellitus, type II ([place])   . Dislocation of hip, congenital   . Fibromyalgia   . Hearing loss    left ear  . Hyperlipidemia   . Hypertension   . Hypothyroidism   . Obesity   . Prediabetes   . Pulmonary embolism ([place]) 01/2017  . Reflux   . Sleep apnea   . Spinal stenosis   . Tendonitis   . Thyroid cancer [facility])     Patient Active Problem List   Diagnosis Date Noted  . Post-phlebitic syndrome 03/14/2018  . Lymphedema 03/14/2018  . Diabetes ([place]) 03/19/2017  . Pulmonary embolism, bilateral ([place]) 02/12/2017  . Gout 10/26/2015  . [facility] (congenital dislocation of the hip) 06/19/2015  . HLD (hyperlipidemia) 06/19/2015  . BP (high blood pressure) 06/19/2015  . Adiposity 06/19/2015  . Borderline diabetes mellitus 06/19/2015  . Obstructive  apnea 06/19/2015  . H/O malignant neoplasm of thyroid 07/15/2014  . Hypothyroidism, postop 07/15/2014  . Degeneration of intervertebral disc of lumbar region 03/18/2013  . Fibromyalgia 03/18/2013  . LBP (low back pain) 03/18/2013    Past Surgical History:  Procedure Laterality Date  . COLONOSCOPY WITH PROPOFOL N/A 02/09/2015   Procedure: COLONOSCOPY WITH PROPOFOL;  Surgeon: [name], MD;  Location: [facility] ENDOSCOPY;  Service: Endoscopy;  Laterality: N/A;  . ESOPHAGOGASTRODUODENOSCOPY N/A 02/09/2015   Procedure: ESOPHAGOGASTRODUODENOSCOPY (EGD);  Surgeon: [name], MD;  Location: [facility] ENDOSCOPY;  Service: Endoscopy;  Laterality: N/A;  . HIP SURGERY    . IVC FILTER INSERTION N/A 02/14/2017   Procedure: IVC Filter Insertion;  Surgeon: Katha Cabal, MD;  Location: [place] CV LAB;  Service: Cardiovascular;  Laterality: N/A;  . IVC FILTER REMOVAL N/A 03/24/2018   Procedure: IVC FILTER REMOVAL;  Surgeon: Katha Cabal, MD;  Location: [place] CV LAB;  Service: Cardiovascular;  Laterality: N/A;  . LIMB SPARING RESECTION HIP W/ SADDLE JOINT REPLACEMENT Bilateral   . THYROID SURGERY    . TOTAL HIP REVISION      Prior to Admission medications   Medication Sig Start Date End Date Taking? Authorizing Provider  acetaminophen (TYLENOL) 500 MG tablet Take 1,000 mg by mouth every 6 (six) hours as needed for moderate pain or headache.    [provider]  apixaban Arne [place])  5 MG TABS tablet Take 1 tablet (5 mg total) by mouth 2 (two) times daily. Start after 6 days. Patient taking differently: Take 5 mg by mouth 2 (two) times daily.  02/22/17 07/21/37  Vaughan Basta, MD  Biotin 10 MG TABS Take 10 mg by mouth daily.     [provider]  dapagliflozin propanediol (FARXIGA) 5 MG TABS tablet Take 5 mg by mouth daily. 06/19/17 06/19/18  [provider]  diazepam (VALIUM) 5 MG tablet Take 5 mg by mouth at bedtime.    [provider]  dicyclomine (BENTYL) 10 MG capsule Take 10 mg by mouth every 6 (six) hours.     [provider]  gabapentin (NEURONTIN) 300 MG capsule Take 300 mg by mouth 2 (two) times daily.    [provider]  glimepiride (AMARYL) 2 MG tablet Take 2 mg by mouth daily with breakfast.  12/18/17 12/18/18  [provider]  HYDROcodone-acetaminophen (NORCO) 5-325 MG tablet Take 1 tablet by mouth every 6 (six) hours as needed for up to 15 doses for severe pain. 05/23/18   Darel Hong, MD  levothyroxine (SYNTHROID, LEVOTHROID) 175 MCG tablet Take 175 mcg by mouth daily before breakfast.  12/02/16   [provider]  lidocaine (LMX) 4 % cream Apply 1 application topically daily as needed (for pain).    [provider]  lisinopril (PRINIVIL,ZESTRIL) 40 MG tablet Take 40 mg by mouth daily.    [provider]  morphine (MS CONTIN) 15 MG 12 hr tablet Take 1 tablet (15 mg total) by mouth every 12 (twelve) hours. 02/17/17   Vaughan Basta, MD  pantoprazole (PROTONIX) 40 MG tablet Take 40 mg by mouth 2 (two) times daily.     [provider]  ranitidine (ZANTAC) 300 MG tablet Take 300 mg by mouth daily with supper.  12/23/16   [provider]  rosuvastatin (CRESTOR) 40 MG tablet Take 40 mg by mouth daily.     [provider]  sitaGLIPtin (JANUVIA) 100 MG tablet Take 100 mg by mouth daily. 08/30/16   [provider]  Vitamin D, Ergocalciferol, (DRISDOL) 50000 units CAPS capsule Take 50,000 Units by mouth every Wednesday.     [provider]    Allergies Patient has no known allergies.  Family History  Problem Relation Age of Onset  . Heart disease Mother   . Hypertension Mother   . Alzheimer's disease Mother   . Diabetes Mother   . Stroke Mother   . Anxiety disorder Mother   . Diabetes Father   . Alcohol abuse Father   . Bladder Cancer Sister   . Thyroid disease Brother   . Diabetes Daughter   . Diabetes  Maternal Aunt     Social History Social History   Tobacco Use  . Smoking status: Never Smoker  . Smokeless tobacco: Never Used  Substance Use Topics  . Alcohol use: No    Alcohol/week: 0.0 standard drinks  . Drug use: No    Review of Systems Constitutional: No fever/chills Eyes: No visual changes. ENT: No sore throat. Cardiovascular: Positive for chest pain. Respiratory: Positive for shortness of breath. Gastrointestinal: No abdominal pain.  No nausea, no vomiting.  No diarrhea.  No constipation. Genitourinary: Negative for dysuria. Musculoskeletal: Positive for right knee pain Skin: Negative for rash. Neurological: Negative for headaches, focal weakness or numbness.   ____________________________________________   PHYSICAL EXAM:  VITAL SIGNS: ED Triage Vitals  Enc Vitals Group     BP  05/22/18 2337 (!) 142/70     Pulse Rate 05/22/18 2337 (!) 105     Resp 05/22/18 2337 16     Temp --      Temp Source 05/22/18 2337 Oral     SpO2 05/22/18 2337 99 %     Weight 05/22/18 2340 170 lb (77.1 kg)     Height 05/22/18 2340 4\' 8"  (1.422 m)     Head Circumference --      Peak Flow --      Pain Score 05/22/18 2340 10     Pain Loc --      Pain Edu? --      Excl. in [place]? --     Constitutional: Alert and oriented x4 appears obviously uncomfortable grimacing holding her right knee Eyes: PERRL EOMI. Head: Atraumatic. Nose: No congestion/rhinnorhea. Mouth/Throat: No trismus Neck: No stridor.   Cardiovascular: Tachycardic rate, regular rhythm. Grossly normal heart sounds.  Good peripheral circulation. Respiratory: Increased respiratory effort.  No retractions. Lungs CTAB and moving good air Gastrointestinal: Obese soft nontender Musculoskeletal: Bilateral lower extremities are equal in size of the right knee has a small effusion.  Extensor mechanism intact knees are grossly stable Neurologic:  Normal speech and language. No gross focal neurologic deficits are  appreciated. Skin:  Skin is warm, dry and intact. No rash noted. Psychiatric: Mood and affect are normal. Speech and behavior are normal.    ____________________________________________   DIFFERENTIAL includes but not limited to  Coronary embolism, Eliquis failure, knee effusion, DVT ____________________________________________   LABS (all labs ordered are listed, but only abnormal results are displayed)  Labs Reviewed  CBC WITH DIFFERENTIAL/PLATELET - Abnormal; Notable for the following components:      Result Value   WBC 13.6 (*)    Neutro Abs 11.4 (*)    All other components within normal limits  COMPREHENSIVE METABOLIC PANEL - Abnormal; Notable for the following components:   Glucose, Bld 168 (*)    Total Protein 8.5 (*)    All other components within normal limits  TROPONIN I  BRAIN NATRIURETIC PEPTIDE    Lab work reviewed by me with elevated white count which is nonspecific and could be secondary to pain Troponin and beta natruretic peptide's are negative which go against significant PE __________________________________________  EKG  ED ECG REPORT I, Darel Hong, the attending physician, personally viewed and interpreted this ECG.  Date: 05/22/2018 EKG Time:  Rate: 107 Rhythm: Sinus tachycardia QRS Axis: normal Intervals: normal ST/T Wave abnormalities: normal Narrative Interpretation: no evidence of acute ischemia  ____________________________________________  RADIOLOGY  X-ray of the right knee reviewed by me with significant osteoarthritis and a small effusion CT angiogram of the chest reviewed by me with no evidence of pulmonary embolism ____________________________________________   PROCEDURES  Procedure(s) performed: Yes  .Joint Aspiration/Arthrocentesis Date/Time: 05/23/2018 3:11 AM Performed by: Darel Hong, MD Authorized by: Darel Hong, MD   Consent:    Consent obtained:  Verbal   Consent given by:  Patient   Risks  discussed:  Bleeding, pain, incomplete drainage and infection   Alternatives discussed:  Alternative treatment Location:    Location:  Knee   Knee:  R knee Anesthesia (see MAR for exact dosages):    Anesthesia method:  None Procedure details:    Preparation: Patient was prepped and draped in usual sterile fashion     Needle gauge: 21.   Ultrasound guidance: no     Approach:  Lateral   Aspirate amount:  Trace  Aspirate characteristics:  Blood-tinged   Steroid injected: yes     Specimen collected: no   Post-procedure details:    Dressing:  Adhesive bandage Comments:     In full sterile technique used a 21-gauge needle and aspirated a flash of joint fluid from the patient's right knee via superior lateral approach.  I then infused 1 cc of Kenalog with 7 cc of 0.5% bupivacaine without epinephrine with significant improvement in her symptoms.    Critical Care performed: no  ____________________________________________   INITIAL IMPRESSION / ASSESSMENT AND PLAN / ED COURSE  Pertinent labs & imaging results that were available during my care of the patient were reviewed by me and considered in my medical decision making (see chart for details).   As part of my medical decision making, I reviewed the following data within the [place] History obtained from family if available, nursing notes, old chart and ekg, as well as notes from prior ED visits. The patient is saturating 99% on room air although with slightly elevated respiratory rate and appears uncomfortable.  She seems to have a new right knee effusion.  Her IVC filter is been out and she has not missed a single dose of Eliquis since July when she was instructed to stop her Eliquis for 3 days around the procedure.  She is quite heavy and Eliquis has been known to have failures particularly in people who are overweight so I will give her 6 mg of IV morphine now for the pain however she will require a new CT angiogram  of her chest to evaluate for new pulmonary embolism and Eliquis failure.     ----------------------------------------- 3:46 AM on 05/23/2018 -----------------------------------------  Fortunately the patient's CT scan is negative.  Her complaint now is primarily knee pain.  We discussed symptomatic treatment versus bupivacaine and Kenalog injection now and she agreed with arthrocentesis and injection.  I was not able to get much fluid out from her knee however I was able to infuse Kenalog and bupivacaine with significant improvement in her pain.  I will refer her to orthopedic surgery as a follow-up.  Strict return precautions have been given. ____________________________________________   FINAL CLINICAL IMPRESSION(S) / ED DIAGNOSES  Final diagnoses:  Osteoarthritis of right knee, unspecified osteoarthritis type  Chest pain, unspecified type      NEW MEDICATIONS STARTED DURING THIS VISIT:  New Prescriptions   HYDROCODONE-ACETAMINOPHEN (NORCO) 5-325 MG TABLET    Take 1 tablet by mouth every 6 (six) hours as needed for up to 15 doses for severe pain.     Note:  This document was prepared using Dragon voice recognition software and may include unintentional dictation errors.     Darel Hong, MD 05/23/18 [phone]

## 2018-05-23 ENCOUNTER — Emergency Department: Payer: Medicare Other

## 2018-05-23 ENCOUNTER — Encounter: Payer: Self-pay | Admitting: Radiology

## 2018-05-23 DIAGNOSIS — M1711 Unilateral primary osteoarthritis, right knee: Secondary | ICD-10-CM | POA: Diagnosis not present

## 2018-05-23 LAB — CBC WITH DIFFERENTIAL/PLATELET
Basophils Absolute: 0.1 10*3/uL (ref 0–0.1)
Basophils Relative: 1 %
EOS PCT: 0 %
Eosinophils Absolute: 0 10*3/uL (ref 0–0.7)
HCT: 42.7 % (ref 35.0–47.0)
Hemoglobin: 14.4 g/dL (ref 12.0–16.0)
LYMPHS PCT: 10 %
Lymphs Abs: 1.3 10*3/uL (ref 1.0–3.6)
MCH: 28.9 pg (ref 26.0–34.0)
MCHC: 33.6 g/dL (ref 32.0–36.0)
MCV: 86 fL (ref 80.0–100.0)
MONO ABS: 0.7 10*3/uL (ref 0.2–0.9)
Monocytes Relative: 5 %
NEUTROS ABS: 11.4 10*3/uL — AB (ref 1.4–6.5)
Neutrophils Relative %: 84 %
PLATELETS: 243 10*3/uL (ref 150–440)
RBC: 4.96 MIL/uL (ref 3.80–5.20)
RDW: 13.6 % (ref 11.5–14.5)
WBC: 13.6 10*3/uL — ABNORMAL HIGH (ref 3.6–11.0)

## 2018-05-23 LAB — COMPREHENSIVE METABOLIC PANEL
ALT: 22 U/L (ref 0–44)
ANION GAP: 14 (ref 5–15)
AST: 21 U/L (ref 15–41)
Albumin: 4.2 g/dL (ref 3.5–5.0)
Alkaline Phosphatase: 75 U/L (ref 38–126)
BUN: 9 mg/dL (ref 6–20)
CALCIUM: 9.3 mg/dL (ref 8.9–10.3)
CHLORIDE: 104 mmol/L (ref 98–111)
CO2: 22 mmol/L (ref 22–32)
CREATININE: 0.55 mg/dL (ref 0.44–1.00)
Glucose, Bld: 168 mg/dL — ABNORMAL HIGH (ref 70–99)
Potassium: 4.1 mmol/L (ref 3.5–5.1)
SODIUM: 140 mmol/L (ref 135–145)
Total Bilirubin: 0.7 mg/dL (ref 0.3–1.2)
Total Protein: 8.5 g/dL — ABNORMAL HIGH (ref 6.5–8.1)

## 2018-05-23 LAB — BRAIN NATRIURETIC PEPTIDE: B Natriuretic Peptide: 20 pg/mL (ref 0.0–100.0)

## 2018-05-23 LAB — TROPONIN I

## 2018-05-23 MED ORDER — MORPHINE SULFATE (PF) 4 MG/ML IV SOLN
6.0000 mg | Freq: Once | INTRAVENOUS | Status: AC
  Administered 2018-05-23: 6 mg via INTRAVENOUS
  Filled 2018-05-23: qty 2

## 2018-05-23 MED ORDER — IOHEXOL 350 MG/ML SOLN
75.0000 mL | Freq: Once | INTRAVENOUS | Status: AC | PRN
  Administered 2018-05-23: 75 mL via INTRAVENOUS

## 2018-05-23 MED ORDER — HYDROCODONE-ACETAMINOPHEN 5-325 MG PO TABS
1.0000 | ORAL_TABLET | Freq: Once | ORAL | Status: AC
  Administered 2018-05-23: 1 via ORAL
  Filled 2018-05-23: qty 1

## 2018-05-23 MED ORDER — BUPIVACAINE HCL (PF) 0.5 % IJ SOLN
30.0000 mL | Freq: Once | INTRAMUSCULAR | Status: AC
  Administered 2018-05-23: 30 mL
  Filled 2018-05-23: qty 30

## 2018-05-23 MED ORDER — HYDROCODONE-ACETAMINOPHEN 5-325 MG PO TABS: 1.0000 | ORAL_TABLET | Freq: Four times a day (QID) | ORAL | 0 refills | Status: DC | PRN

## 2018-05-23 MED ORDER — ONDANSETRON HCL 4 MG/2ML IJ SOLN
4.0000 mg | Freq: Once | INTRAMUSCULAR | Status: AC
  Administered 2018-05-23: 4 mg via INTRAVENOUS
  Filled 2018-05-23: qty 2

## 2018-05-23 MED ORDER — KETOROLAC TROMETHAMINE 30 MG/ML IJ SOLN
10.0000 mg | Freq: Once | INTRAMUSCULAR | Status: AC
  Administered 2018-05-23: 9.9 mg via INTRAVENOUS
  Filled 2018-05-23: qty 1

## 2018-05-23 MED ORDER — TRIAMCINOLONE ACETONIDE 40 MG/ML IJ SUSP
40.0000 mg | Freq: Once | INTRAMUSCULAR | Status: AC
  Administered 2018-05-23: 40 mg via INTRA_ARTICULAR
  Filled 2018-05-23 (×2): qty 1

## 2018-05-23 NOTE — Discharge Instructions (Signed)
Please take your pain medication as needed for severe symptoms and make an appointment to follow-up with the orthopedic surgeon for reevaluation.  Return to the emergency department sooner for any concerns.  It was a pleasure to take care of you today, and thank you for coming to our emergency department.  If you have any questions or concerns before leaving please ask the nurse to grab me and I'm more than happy to go through your aftercare instructions again.  If you were prescribed any opioid pain medication today such as Norco, Vicodin, Percocet, morphine, hydrocodone, or oxycodone please make sure you do not drive when you are taking this medication as it can alter your ability to drive safely.  If you have any concerns once you are home that you are not improving or are in fact getting worse before you can make it to your follow-up appointment, please do not hesitate to call 911 and come back for further evaluation.  Darel Hong, MD  Results for orders placed or performed during the hospital encounter of 05/22/18  CBC with Differential  Result Value Ref Range   WBC 13.6 (H) 3.6 - 11.0 K/uL   RBC 4.96 3.80 - 5.20 MIL/uL   Hemoglobin 14.4 12.0 - 16.0 g/dL   HCT 42.7 35.0 - 47.0 %   MCV 86.0 80.0 - 100.0 fL   MCH 28.9 26.0 - 34.0 pg   MCHC 33.6 32.0 - 36.0 g/dL   RDW 13.6 11.5 - 14.5 %   Platelets 243 150 - 440 K/uL   Neutrophils Relative % 84 %   Neutro Abs 11.4 (H) 1.4 - 6.5 K/uL   Lymphocytes Relative 10 %   Lymphs Abs 1.3 1.0 - 3.6 K/uL   Monocytes Relative 5 %   Monocytes Absolute 0.7 0.2 - 0.9 K/uL   Eosinophils Relative 0 %   Eosinophils Absolute 0.0 0 - 0.7 K/uL   Basophils Relative 1 %   Basophils Absolute 0.1 0 - 0.1 K/uL  Comprehensive metabolic panel  Result Value Ref Range   Sodium 140 135 - 145 mmol/L   Potassium 4.1 3.5 - 5.1 mmol/L   Chloride 104 98 - 111 mmol/L   CO2 22 22 - 32 mmol/L   Glucose, Bld 168 (H) 70 - 99 mg/dL   BUN 9 6 - 20 mg/dL   Creatinine, Ser 0.55 0.44 - 1.00 mg/dL   Calcium 9.3 8.9 - 10.3 mg/dL   Total Protein 8.5 (H) 6.5 - 8.1 g/dL   Albumin 4.2 3.5 - 5.0 g/dL   AST 21 15 - 41 U/L   ALT 22 0 - 44 U/L   Alkaline Phosphatase 75 38 - 126 U/L   Total Bilirubin 0.7 0.3 - 1.2 mg/dL   GFR calc non Af Amer >60 >60 mL/min   GFR calc Af Amer >60 >60 mL/min   Anion gap 14 5 - 15  Troponin I  Result Value Ref Range   Troponin I <0.03 <0.03 ng/mL  Brain natriuretic peptide  Result Value Ref Range   B Natriuretic Peptide 20.0 0.0 - 100.0 pg/mL   Dg Chest 1 View  Result Date: 05/23/2018 CLINICAL DATA:  57 year old female with shortness of breath. EXAM: CHEST  1 VIEW COMPARISON:  Chest radiograph dated 02/12/2017 and CT dated 02/12/2017 FINDINGS: The heart size and mediastinal contours are within normal limits. Both lungs are clear. The visualized skeletal structures are unremarkable. IMPRESSION: No active disease. Electronically Signed   By: Laren Everts.D.  On: 05/23/2018 00:40   Ct Angio Chest Pe W/cm &/or Wo Cm  Result Date: 05/23/2018 CLINICAL DATA:  Dyspnea, chest and right leg pain. History of pulmonary emboli and DVT. Patient had an IVC filter in the past, since removed. dyspnea. EXAM: CT ANGIOGRAPHY CHEST WITH CONTRAST TECHNIQUE: Multidetector CT imaging of the chest was performed using the standard protocol during bolus administration of intravenous contrast. Multiplanar CT image reconstructions and MIPs were obtained to evaluate the vascular anatomy. CONTRAST:  67mL OMNIPAQUE IOHEXOL 350 MG/ML SOLN COMPARISON:  Same day CXR, chest CT 02/12/2017 FINDINGS: Cardiovascular: The study is of quality for the evaluation of pulmonary embolism. There are no filling defects in the central, lobar, segmental or subsegmental pulmonary artery branches to suggest acute pulmonary embolism. Great vessels are normal in course and caliber. Stable mild cardiomegaly. No significant pericardial fluid/thickening. No aortic aneurysm or  dissection. Minimal aortic atherosclerosis. Mediastinum/Nodes: No discrete thyroid nodules. Unremarkable esophagus. Small hiatal hernia. No pathologically enlarged axillary, mediastinal or hilar lymph nodes. Lungs/Pleura: No pneumothorax. No pleural effusion. Minimal dependent atelectasis bilaterally. Upper abdomen: Unremarkable. Musculoskeletal: No aggressive appearing focal osseous lesions. No fracture. Mild degenerative change along the dorsal spine. Review of the MIP images confirms the above findings. IMPRESSION: No acute pulmonary embolus.  No active pulmonary disease. Aortic Atherosclerosis (ICD10-I70.0). Electronically Signed   By: Ashley Royalty M.D.   On: 05/23/2018 01:46   Dg Knee Complete 4 Views Right  Result Date: 05/23/2018 CLINICAL DATA:  57 y/o  F; right leg pain. EXAM: RIGHT KNEE - COMPLETE 4+ VIEW COMPARISON:  None. FINDINGS: Partially visualized plate fixation of the right femoral shaft. No acute fracture or dislocation identified. Mild-to-moderate osteoarthrosis of the knee joint with loss of femorotibial compartment joint spaces, lateral compartment chondrocalcinosis, and small periarticular osteophytes. Small suprapatellar joint effusion. IMPRESSION: Mild to moderate osteoarthrosis of the knee with lateral femorotibial compartment chondrocalcinosis and small suprapatellar joint effusion. Electronically Signed   By: Kristine Garbe M.D.   On: 05/23/2018 00:36

## 2018-05-23 NOTE — ED Notes (Signed)
Pt very short of breath and has a history of blood clots in lungs and legs. Pt had a filter that was removed. Pt has been taking eloquist.  Right knee is also very swollen

## 2018-05-23 NOTE — ED Notes (Signed)
Call to pharmacy to send kenalog

## 2018-05-25 ENCOUNTER — Ambulatory Visit (INDEPENDENT_AMBULATORY_CARE_PROVIDER_SITE_OTHER): Payer: Medicare Other | Admitting: Vascular Surgery

## 2018-05-25 ENCOUNTER — Encounter (INDEPENDENT_AMBULATORY_CARE_PROVIDER_SITE_OTHER): Payer: Medicare Other

## 2018-06-02 ENCOUNTER — Ambulatory Visit (INDEPENDENT_AMBULATORY_CARE_PROVIDER_SITE_OTHER): Payer: Medicare Other

## 2018-06-02 ENCOUNTER — Encounter: Payer: Self-pay | Admitting: Podiatry

## 2018-06-02 ENCOUNTER — Ambulatory Visit (INDEPENDENT_AMBULATORY_CARE_PROVIDER_SITE_OTHER): Payer: Medicare Other | Admitting: Podiatry

## 2018-06-02 DIAGNOSIS — M65871 Other synovitis and tenosynovitis, right ankle and foot: Secondary | ICD-10-CM

## 2018-06-02 DIAGNOSIS — E0843 Diabetes mellitus due to underlying condition with diabetic autonomic (poly)neuropathy: Secondary | ICD-10-CM

## 2018-06-02 DIAGNOSIS — E119 Type 2 diabetes mellitus without complications: Secondary | ICD-10-CM

## 2018-06-02 DIAGNOSIS — M65872 Other synovitis and tenosynovitis, left ankle and foot: Secondary | ICD-10-CM

## 2018-06-02 DIAGNOSIS — M2170 Unequal limb length (acquired), unspecified site: Secondary | ICD-10-CM

## 2018-06-02 DIAGNOSIS — M659 Synovitis and tenosynovitis, unspecified: Secondary | ICD-10-CM

## 2018-06-05 NOTE — Progress Notes (Signed)
   Subjective:  57 year old female with PMHx of T2DM presenting today with a chief complaint of intermittent sharp, stabbing pain to the bilateral ankles that began several years ago. She reports associated numbness and tingling in the feet bilaterally. Walking and standing increases the pain. She has been taking Gabapentin for her symptoms and also takes Morphine daily. Patient is here for further evaluation and treatment.   Past Medical History:  Diagnosis Date  . Deep vein thrombosis (DVT) ([place])   . Diabetes mellitus, type II ([place])   . Dislocation of hip, congenital   . Fibromyalgia   . Hearing loss    left ear  . Hyperlipidemia   . Hypertension   . Hypothyroidism   . Obesity   . Prediabetes   . Pulmonary embolism ([place]) 01/2017  . Reflux   . Sleep apnea   . Spinal stenosis   . Tendonitis   . Thyroid cancer ([place])      Objective / Physical Exam:  General:  The patient is alert and oriented x3 in no acute distress. Dermatology:  Skin is warm, dry and supple bilateral lower extremities. Negative for open lesions or macerations. Vascular:  Palpable pedal pulses bilaterally. No edema or erythema noted. Capillary refill within normal limits. Neurological:  Epicritic and protective threshold diminished bilaterally.  Musculoskeletal Exam:  Pain on palpation to the anterior lateral medial aspects of the patient's bilateral ankles. Mild edema noted. Range of motion within normal limits to all pedal and ankle joints bilateral. Muscle strength 5/5 in all groups bilateral.   Radiographic Exam:  Normal osseous mineralization. Joint spaces preserved. No fracture/dislocation/boney destruction.    Assessment: 1. Ankle synovitis bilateral 2. T2DM with polyneuropathy 3. Limb length discrepancy, left shorter than right    Plan of Care:  1. Patient was evaluated. 2. injection of 0.5 mL Celestone Soluspan injected in the patient's bilateral ankle joints. 3. Plantar fascial braces  dispensed bilaterally.  4. Prescription for DM shoes and insoles provided to patient to take to [employer] [employer].  5. Continue pain management at the pain center.  6. Return to clinic as needed.    Edrick Kins, DPM [facility]  Dr. Edrick Kins, [address]                                        [place], [place] [address]                Office [phone]  Fax [phone]

## 2018-07-27 ENCOUNTER — Emergency Department: Payer: Medicare Other

## 2018-07-27 ENCOUNTER — Emergency Department
Admission: EM | Admit: 2018-07-27 | Discharge: 2018-07-27 | Disposition: A | Discharge status: Home / Self Care | Payer: Medicare Other | Attending: Emergency Medicine | Admitting: Emergency Medicine

## 2018-07-27 ENCOUNTER — Other Ambulatory Visit: Payer: Self-pay

## 2018-07-27 DIAGNOSIS — M79604 Pain in right leg: Secondary | ICD-10-CM | POA: Diagnosis not present

## 2018-07-27 DIAGNOSIS — I10 Essential (primary) hypertension: Secondary | ICD-10-CM | POA: Insufficient documentation

## 2018-07-27 DIAGNOSIS — Z7984 Long term (current) use of oral hypoglycemic drugs: Secondary | ICD-10-CM | POA: Insufficient documentation

## 2018-07-27 DIAGNOSIS — E039 Hypothyroidism, unspecified: Secondary | ICD-10-CM | POA: Diagnosis not present

## 2018-07-27 DIAGNOSIS — E119 Type 2 diabetes mellitus without complications: Secondary | ICD-10-CM | POA: Diagnosis not present

## 2018-07-27 DIAGNOSIS — M109 Gout, unspecified: Secondary | ICD-10-CM | POA: Diagnosis not present

## 2018-07-27 DIAGNOSIS — Z79899 Other long term (current) drug therapy: Secondary | ICD-10-CM | POA: Insufficient documentation

## 2018-07-27 DIAGNOSIS — M25561 Pain in right knee: Secondary | ICD-10-CM | POA: Diagnosis present

## 2018-07-27 LAB — COMPREHENSIVE METABOLIC PANEL
ALT: 21 U/L (ref 0–44)
AST: 17 U/L (ref 15–41)
Albumin: 4.3 g/dL (ref 3.5–5.0)
Alkaline Phosphatase: 61 U/L (ref 38–126)
Anion gap: 10 (ref 5–15)
BUN: 7 mg/dL (ref 6–20)
CHLORIDE: 103 mmol/L (ref 98–111)
CO2: 29 mmol/L (ref 22–32)
CREATININE: 0.51 mg/dL (ref 0.44–1.00)
Calcium: 9.3 mg/dL (ref 8.9–10.3)
Glucose, Bld: 96 mg/dL (ref 70–99)
Potassium: 3.7 mmol/L (ref 3.5–5.1)
Sodium: 142 mmol/L (ref 135–145)
Total Bilirubin: 0.6 mg/dL (ref 0.3–1.2)
Total Protein: 8.1 g/dL (ref 6.5–8.1)

## 2018-07-27 LAB — CBC
HEMATOCRIT: 45.2 % (ref 36.0–46.0)
Hemoglobin: 14.5 g/dL (ref 12.0–15.0)
MCH: 28.4 pg (ref 26.0–34.0)
MCHC: 32.1 g/dL (ref 30.0–36.0)
MCV: 88.6 fL (ref 80.0–100.0)
NRBC: 0 % (ref 0.0–0.2)
Platelets: 215 10*3/uL (ref 150–400)
RBC: 5.1 MIL/uL (ref 3.87–5.11)
RDW: 13.5 % (ref 11.5–15.5)
WBC: 11.8 10*3/uL — AB (ref 4.0–10.5)

## 2018-07-27 LAB — FIBRIN DERIVATIVES D-DIMER (ARMC ONLY): Fibrin derivatives D-dimer (ARMC): 2148.34 ng/mL (FEU) — ABNORMAL HIGH (ref 0.00–499.00)

## 2018-07-27 LAB — SYNOVIAL FLUID, CRYSTAL

## 2018-07-27 MED ORDER — ONDANSETRON HCL 4 MG/2ML IJ SOLN
4.0000 mg | Freq: Once | INTRAMUSCULAR | Status: AC
  Administered 2018-07-27: 4 mg via INTRAVENOUS

## 2018-07-27 MED ORDER — ACETAMINOPHEN 500 MG PO TABS
1000.0000 mg | ORAL_TABLET | Freq: Once | ORAL | Status: AC
  Administered 2018-07-27: 1000 mg via ORAL

## 2018-07-27 MED ORDER — LIDOCAINE-EPINEPHRINE 2 %-1:100000 IJ SOLN
INTRAMUSCULAR | Status: AC
  Administered 2018-07-27: 20:00:00
  Filled 2018-07-27: qty 1

## 2018-07-27 MED ORDER — MORPHINE SULFATE (PF) 4 MG/ML IV SOLN
4.0000 mg | Freq: Once | INTRAVENOUS | Status: AC
  Administered 2018-07-27: 4 mg via INTRAVENOUS

## 2018-07-27 MED ORDER — MORPHINE SULFATE (PF) 4 MG/ML IV SOLN
4.0000 mg | Freq: Once | INTRAVENOUS | Status: AC
  Administered 2018-07-27: 4 mg via INTRAVENOUS
  Filled 2018-07-27: qty 1

## 2018-07-27 MED ORDER — MORPHINE SULFATE (PF) 4 MG/ML IV SOLN
INTRAVENOUS | Status: AC
  Administered 2018-07-27: 4 mg via INTRAVENOUS
  Filled 2018-07-27: qty 1

## 2018-07-27 MED ORDER — ACETAMINOPHEN 500 MG PO TABS
ORAL_TABLET | ORAL | Status: AC
  Filled 2018-07-27: qty 2

## 2018-07-27 MED ORDER — ONDANSETRON HCL 4 MG/2ML IJ SOLN
INTRAMUSCULAR | Status: AC
  Administered 2018-07-27: 4 mg via INTRAVENOUS
  Filled 2018-07-27: qty 2

## 2018-07-27 MED ORDER — TRAMADOL HCL 50 MG PO TABS: 50.0000 mg | ORAL_TABLET | Freq: Four times a day (QID) | ORAL | 0 refills | Status: DC | PRN

## 2018-07-27 MED ORDER — ACETAMINOPHEN 500 MG PO TABS
500.0000 mg | ORAL_TABLET | Freq: Once | ORAL | Status: AC
  Administered 2018-07-27: 500 mg via ORAL
  Filled 2018-07-27: qty 1

## 2018-07-27 MED ORDER — KETOROLAC TROMETHAMINE 30 MG/ML IJ SOLN
30.0000 mg | Freq: Once | INTRAMUSCULAR | Status: AC
  Administered 2018-07-27: 30 mg via INTRAVENOUS
  Filled 2018-07-27: qty 1

## 2018-07-27 NOTE — ED Provider Notes (Addendum)
[facility] Emergency Department Provider Note   ____________________________________________    I have reviewed the triage vital signs and the nursing notes.   HISTORY  Chief Complaint Leg Pain     HPI Katie Baker is a 57 y.o. female who presents with complaints of right knee pain.  Patient reports symptoms started about 4 days ago which she attributed the pain to her fibromyalgia.  However the right knee began to feel worse, both behind the knee and on top of the knee and she started to have swelling yesterday.  She saw her rheumatologist today who she sees for gout, rheumatologist attempted to drain fluid from the unsuccessfully.  Sent to the emergency department for further evaluation for possible blood clot as the patient does have a history of DVT.  She denies calf pain to me.  No thigh pain.  No pleurisy or shortness of breath.   Past Medical History:  Diagnosis Date  . Deep vein thrombosis (DVT) ([place])   . Diabetes mellitus, type II ([place])   . Dislocation of hip, congenital   . Fibromyalgia   . Hearing loss    left ear  . Hyperlipidemia   . Hypertension   . Hypothyroidism   . Obesity   . Prediabetes   . Pulmonary embolism ([place]) 01/2017  . Reflux   . Sleep apnea   . Spinal stenosis   . Tendonitis   . Thyroid cancer [facility])     Patient Active Problem List   Diagnosis Date Noted  . Post-phlebitic syndrome 03/14/2018  . Lymphedema 03/14/2018  . Diabetes ([place]) 03/19/2017  . Pulmonary embolism, bilateral ([place]) 02/12/2017  . Pleuritic chest pain 02/12/2017  . Swelling of right lower extremity 02/12/2017  . Acute pain of right shoulder 08/29/2016  . Chronic gouty arthropathy without tophi 12/05/2015  . Gout 10/26/2015  . Cervical disc disease 09/05/2015  . Chronic tension-type headache, intractable 09/05/2015  . [facility] (congenital dislocation of the hip) 06/19/2015  . HLD (hyperlipidemia) 06/19/2015  . BP (high blood pressure)  06/19/2015  . Adiposity 06/19/2015  . Borderline diabetes mellitus 06/19/2015  . Obstructive apnea 06/19/2015  . H/O malignant neoplasm of thyroid 07/15/2014  . Hypothyroidism, postop 07/15/2014  . Degeneration of intervertebral disc of lumbar region 03/18/2013  . Fibromyalgia 03/18/2013  . LBP (low back pain) 03/18/2013    Past Surgical History:  Procedure Laterality Date  . COLONOSCOPY WITH PROPOFOL N/A 02/09/2015   Procedure: COLONOSCOPY WITH PROPOFOL;  Surgeon: [name], MD;  Location: [facility] ENDOSCOPY;  Service: Endoscopy;  Laterality: N/A;  . ESOPHAGOGASTRODUODENOSCOPY N/A 02/09/2015   Procedure: ESOPHAGOGASTRODUODENOSCOPY (EGD);  Surgeon: [name], MD;  Location[facility] ENDOSCOPY;  Service: Endoscopy;  Laterality: N/A;  . HIP SURGERY    . IVC FILTER INSERTION N/A 02/14/2017   Procedure: IVC Filter Insertion;  Surgeon: Katha Cabal, MD;  Location: [place] CV LAB;  Service: Cardiovascular;  Laterality: N/A;  . IVC FILTER REMOVAL N/A 03/24/2018   Procedure: IVC FILTER REMOVAL;  Surgeon: Katha Cabal, MD;  Location: [place] CV LAB;  Service: Cardiovascular;  Laterality: N/A;  . LIMB SPARING RESECTION HIP W/ SADDLE JOINT REPLACEMENT Bilateral   . THYROID SURGERY    . TOTAL HIP REVISION      Prior to Admission medications   Medication Sig Start Date End Date Taking? Authorizing Provider  acetaminophen (TYLENOL) 500 MG tablet Take 1,000 mg by mouth every 6 (six) hours as needed for moderate pain or  headache.    [provider]  apixaban (ELIQUIS) 5 MG TABS tablet Take 1 tablet (5 mg total) by mouth 2 (two) times daily. Start after 6 days. Patient taking differently: Take 5 mg by mouth 2 (two) times daily.  02/22/17 07/21/37  Vaughan Basta, MD  Biotin 10 MG TABS Take 10 mg by mouth daily.     [provider]  diazepam (VALIUM) 5 MG tablet Take 5 mg by mouth at bedtime.    [provider]  dicyclomine (BENTYL) 10  MG capsule Take 10 mg by mouth every 6 (six) hours.     [provider]  gabapentin (NEURONTIN) 300 MG capsule Take 300 mg by mouth 2 (two) times daily.    [provider]  glimepiride (AMARYL) 2 MG tablet Take 2 mg by mouth daily with breakfast.  12/18/17 12/18/18  [provider]  HYDROcodone-acetaminophen (NORCO) 5-325 MG tablet Take 1 tablet by mouth every 6 (six) hours as needed for up to 15 doses for severe pain. 05/23/18   Darel Hong, MD  levothyroxine (SYNTHROID, LEVOTHROID) 175 MCG tablet Take 175 mcg by mouth daily before breakfast.  12/02/16   [provider]  lidocaine (LMX) 4 % cream Apply 1 application topically daily as needed (for pain).    [provider]  linaclotide (LINZESS) 72 MCG capsule TAKE 1 CAPSULE(72 MCG) BY MOUTH EVERY DAY 04/07/17   [provider]  lisinopril (PRINIVIL,ZESTRIL) 40 MG tablet Take 40 mg by mouth daily.    [provider]  Melatonin 5 MG CAPS Take by mouth.    [provider]  morphine (MS CONTIN) 15 MG 12 hr tablet Take 1 tablet (15 mg total) by mouth every 12 (twelve) hours. 02/17/17   Vaughan Basta, MD  pantoprazole (PROTONIX) 40 MG tablet Take 40 mg by mouth 2 (two) times daily.     [provider]  ranitidine (ZANTAC) 300 MG tablet Take 300 mg by mouth daily with supper.  12/23/16   [provider]  rosuvastatin (CRESTOR) 40 MG tablet Take 40 mg by mouth daily.     [provider]  sitaGLIPtin (JANUVIA) 100 MG tablet Take 100 mg by mouth daily. 08/30/16   [provider]  Vitamin D, Ergocalciferol, (DRISDOL) 50000 units CAPS capsule Take 50,000 Units by mouth every Wednesday.     [provider]     Allergies Patient has no known allergies.  Family History  Problem Relation Age of Onset  . Heart disease Mother   . Hypertension Mother   . Alzheimer's disease Mother   . Diabetes Mother   . Stroke Mother   . Anxiety disorder  Mother   . Diabetes Father   . Alcohol abuse Father   . Bladder Cancer Sister   . Thyroid disease Brother   . Diabetes Daughter   . Diabetes Maternal Aunt     Social History Social History   Tobacco Use  . Smoking status: Never Smoker  . Smokeless tobacco: Never Used  Substance Use Topics  . Alcohol use: No    Alcohol/week: 0.0 standard drinks  . Drug use: No    Review of Systems  Constitutional: Denies fevers chills Eyes: No visual changes.  ENT: No sore throat. Cardiovascular: Denies chest pain. Respiratory: Denies shortness of breath.  No pleurisy Gastrointestinal: No abdominal pain. Genitourinary: Negative for dysuria. Musculoskeletal: Right knee pain as above Skin: Negative for rash. Neurological: Negative for headaches    ____________________________________________   PHYSICAL EXAM:  VITAL SIGNS: ED Triage Vitals  Enc Vitals Group     BP 07/27/18 1543 (!) 150/90     Pulse Rate 07/27/18 1543 (!) 103     Resp --      Temp 07/27/18 1543 (!) 101.1 F (38.4 C)     Temp Source 07/27/18 1543 Oral     SpO2 07/27/18 1543 98 %     Weight 07/27/18 1548 83.9 kg (185 lb)     Height 07/27/18 1548 1.422 m (4\' 8" )     Head Circumference --      Peak Flow --      Pain Score 07/27/18 1548 10     Pain Loc --      Pain Edu? --      Excl. in [place]? --     Constitutional: Alert and oriented.    Nose: No congestion/rhinnorhea. Mouth/Throat: Mucous membranes are moist.    Cardiovascular: Normal rate, regular rhythm. Grossly normal heart sounds.  Good peripheral circulation. Respiratory: Normal respiratory effort.  No retractions. Lungs CTAB. Gastrointestinal: Soft and nontender. No distention.  No CVA tenderness. Genitourinary: deferred Musculoskeletal: Right knee: Mild erythema, mild swelling, painful range of motion.  Distally warm and well perfused Neurologic:  Normal speech and language. No gross focal neurologic deficits are appreciated.  Skin:  Skin is warm,  dry and intact.  Psychiatric: Mood and affect are normal. Speech and behavior are normal.  ____________________________________________   LABS (all labs ordered are listed, but only abnormal results are displayed)  Labs Reviewed  CBC - Abnormal; Notable for the following components:      Result Value   WBC 11.8 (*)    All other components within normal limits  FIBRIN DERIVATIVES D-DIMER ([facility] ONLY) - Abnormal; Notable for the following components:   Fibrin derivatives D-dimer [facility]) 4,166.06 (*)    All other components within normal limits  BODY FLUID CULTURE  COMPREHENSIVE METABOLIC PANEL  SYNOVIAL FLUID, CRYSTAL   ____________________________________________  EKG  None ____________________________________________  RADIOLOGY  Ultrasound negative for DVT ____________________________________________   PROCEDURES  Procedure(s) performed: yes  .Joint Aspiration/Arthrocentesis Date/Time: 07/27/2018 8:26 PM Performed by: Lavonia Drafts, MD Authorized by: Lavonia Drafts, MD   Consent:    Consent obtained:  Verbal   Consent given by:  Patient   Risks discussed:  Bleeding, infection, pain and incomplete drainage   Alternatives discussed:  No treatment Location:    Location:  Knee   Knee:  R knee Anesthesia (see MAR for exact dosages):    Anesthesia method:  Local infiltration   Local anesthetic:  Lidocaine 1% WITH epi Procedure details:    Preparation: Patient was prepped and draped in usual sterile fashion     Needle gauge:  20 G   Ultrasound guidance: no     Approach:  Anterior   Aspirate amount:  3   Aspirate characteristics:  Blood-tinged   Steroid injected: no     Specimen collected: yes   Post-procedure details:    Dressing:  Adhesive bandage   Patient tolerance of procedure:  Tolerated well, no immediate complications     Critical Care performed: No ____________________________________________   INITIAL IMPRESSION / ASSESSMENT AND PLAN / ED  COURSE  Pertinent labs & imaging results that were available during my care of the patient were reviewed by me and considered in my medical decision making (see chart for details).  Patient presents with right knee pain, she has mild erythema diffusely to the right knee.  Found to be  febrile in triage.  White count is mildly elevated.  D-dimer sent by triage nurse it is elevated, I am not certain of the significance of this, she does not have a DVT and she has no pleurisy or shortness of breath.  Does have a history of gout.  Differential includes crystal disease, less likely septic joint.  Will attempt to obtain joint fluid  Only about 3 cc of joint fluid collected, blood-tinged.  Sent to the lab for evaluation.  ----------------------------------------- 9:30 PM on 07/27/2018 -----------------------------------------  Calcium pyrophosphate crystals on synovial fluid results.  Pending Gram stain.  Presentation appears consistent with gout flare not causing low-grade fever and not septic joint.  We will give IV Toradol.  Discussed results with patient.  Called lab to ask when Gram stain would be available, they state it could be several more hours.  Discussed this with the patient she does not want to wait.  She is comfortable with going home and she will return if any worsening symptoms.  Did discuss with her the risk that this could be an infected joint although unlikely    ____________________________________________   FINAL CLINICAL IMPRESSION(S) / ED DIAGNOSES  Final diagnoses:  Acute gout of right knee, unspecified cause        Note:  This document was prepared using Dragon voice recognition software and may include unintentional dictation errors.     Lavonia Drafts, MD 07/27/18 2202    Lavonia Drafts, MD 07/27/18 2204

## 2018-07-27 NOTE — ED Triage Notes (Addendum)
Pt comes via POV from home with c/o right calf pain that started about 4 days ago. Pt states pain around knee and in the back of her leg.  Pt states hx of blood clots. Pt also states she is able to put some weight on leg but not much.  Pt denies any recent long trips.

## 2018-07-27 NOTE — ED Notes (Signed)
Patient in [place].  Will be transported to room once the study is complete.

## 2018-07-31 LAB — BODY FLUID CULTURE
CULTURE: NO GROWTH
Gram Stain: NONE SEEN

## 2018-08-17 ENCOUNTER — Encounter: Payer: Self-pay | Admitting: Internal Medicine

## 2018-08-17 ENCOUNTER — Inpatient Hospital Stay (HOSPITAL_BASED_OUTPATIENT_CLINIC_OR_DEPARTMENT_OTHER): Payer: Medicare Other | Admitting: Internal Medicine

## 2018-08-17 ENCOUNTER — Other Ambulatory Visit: Payer: Self-pay

## 2018-08-17 ENCOUNTER — Inpatient Hospital Stay: Payer: Medicare Other | Attending: Internal Medicine

## 2018-08-17 VITALS — BP 146/86 | HR 71 | Resp 16 | Wt 181.0 lb

## 2018-08-17 DIAGNOSIS — Z803 Family history of malignant neoplasm of breast: Secondary | ICD-10-CM | POA: Insufficient documentation

## 2018-08-17 DIAGNOSIS — Z85850 Personal history of malignant neoplasm of thyroid: Secondary | ICD-10-CM | POA: Diagnosis not present

## 2018-08-17 DIAGNOSIS — M940 Chondrocostal junction syndrome [Tietze]: Secondary | ICD-10-CM | POA: Insufficient documentation

## 2018-08-17 DIAGNOSIS — Z86711 Personal history of pulmonary embolism: Secondary | ICD-10-CM | POA: Insufficient documentation

## 2018-08-17 DIAGNOSIS — G4730 Sleep apnea, unspecified: Secondary | ICD-10-CM | POA: Diagnosis not present

## 2018-08-17 DIAGNOSIS — T380X5A Adverse effect of glucocorticoids and synthetic analogues, initial encounter: Secondary | ICD-10-CM | POA: Diagnosis not present

## 2018-08-17 DIAGNOSIS — M797 Fibromyalgia: Secondary | ICD-10-CM

## 2018-08-17 DIAGNOSIS — Z8052 Family history of malignant neoplasm of bladder: Secondary | ICD-10-CM | POA: Diagnosis not present

## 2018-08-17 DIAGNOSIS — I2699 Other pulmonary embolism without acute cor pulmonale: Secondary | ICD-10-CM

## 2018-08-17 DIAGNOSIS — Z86718 Personal history of other venous thrombosis and embolism: Secondary | ICD-10-CM | POA: Diagnosis present

## 2018-08-17 DIAGNOSIS — E119 Type 2 diabetes mellitus without complications: Secondary | ICD-10-CM | POA: Diagnosis not present

## 2018-08-17 DIAGNOSIS — E669 Obesity, unspecified: Secondary | ICD-10-CM

## 2018-08-17 DIAGNOSIS — D72829 Elevated white blood cell count, unspecified: Secondary | ICD-10-CM | POA: Insufficient documentation

## 2018-08-17 DIAGNOSIS — K219 Gastro-esophageal reflux disease without esophagitis: Secondary | ICD-10-CM | POA: Insufficient documentation

## 2018-08-17 DIAGNOSIS — E039 Hypothyroidism, unspecified: Secondary | ICD-10-CM

## 2018-08-17 DIAGNOSIS — I10 Essential (primary) hypertension: Secondary | ICD-10-CM | POA: Diagnosis not present

## 2018-08-17 DIAGNOSIS — Z79899 Other long term (current) drug therapy: Secondary | ICD-10-CM | POA: Diagnosis not present

## 2018-08-17 DIAGNOSIS — E785 Hyperlipidemia, unspecified: Secondary | ICD-10-CM | POA: Insufficient documentation

## 2018-08-17 LAB — CBC WITH DIFFERENTIAL/PLATELET
ABS IMMATURE GRANULOCYTES: 0.08 10*3/uL — AB (ref 0.00–0.07)
BASOS PCT: 0 %
Basophils Absolute: 0.1 10*3/uL (ref 0.0–0.1)
EOS ABS: 0.1 10*3/uL (ref 0.0–0.5)
Eosinophils Relative: 1 %
HCT: 43.7 % (ref 36.0–46.0)
Hemoglobin: 14 g/dL (ref 12.0–15.0)
IMMATURE GRANULOCYTES: 1 %
Lymphocytes Relative: 34 %
Lymphs Abs: 4.6 10*3/uL — ABNORMAL HIGH (ref 0.7–4.0)
MCH: 27.8 pg (ref 26.0–34.0)
MCHC: 32 g/dL (ref 30.0–36.0)
MCV: 86.7 fL (ref 80.0–100.0)
MONOS PCT: 6 %
Monocytes Absolute: 0.8 10*3/uL (ref 0.1–1.0)
NEUTROS ABS: 7.8 10*3/uL — AB (ref 1.7–7.7)
NEUTROS PCT: 58 %
PLATELETS: 285 10*3/uL (ref 150–400)
RBC: 5.04 MIL/uL (ref 3.87–5.11)
RDW: 13.2 % (ref 11.5–15.5)
WBC: 13.4 10*3/uL — AB (ref 4.0–10.5)
nRBC: 0 % (ref 0.0–0.2)

## 2018-08-17 LAB — COMPREHENSIVE METABOLIC PANEL
ALK PHOS: 58 U/L (ref 38–126)
ALT: 27 U/L (ref 0–44)
ANION GAP: 10 (ref 5–15)
AST: 15 U/L (ref 15–41)
Albumin: 3.8 g/dL (ref 3.5–5.0)
BILIRUBIN TOTAL: 0.4 mg/dL (ref 0.3–1.2)
BUN: 14 mg/dL (ref 6–20)
CALCIUM: 9.1 mg/dL (ref 8.9–10.3)
CO2: 26 mmol/L (ref 22–32)
Chloride: 105 mmol/L (ref 98–111)
Creatinine, Ser: 0.59 mg/dL (ref 0.44–1.00)
Glucose, Bld: 129 mg/dL — ABNORMAL HIGH (ref 70–99)
POTASSIUM: 3.7 mmol/L (ref 3.5–5.1)
Sodium: 141 mmol/L (ref 135–145)
TOTAL PROTEIN: 7.3 g/dL (ref 6.5–8.1)

## 2018-08-17 NOTE — Progress Notes (Signed)
[place] NOTE  Patient Care Team: Perrin Maltese, MD as PCP - General (Internal Medicine) Perrin Maltese, MD as Referring Physician (Internal Medicine) Robert Bellow, MD (General Surgery)  CHIEF COMPLAINTS/PURPOSE OF CONSULTATION:  Bilateral PE & Right LE DVT   # June MID 2018 Bil PE mod-large/Right LE DVT- [immobility sec to chronic arthritis] s/p IVC filter [Dr.Schneir] on Eliquis. Sep 2019-  IVC filter explantation.  # DEC 2011- Thyroid cancer s/p sub total thyroidectomy & DM-2 [Dr.Solum; s/p  RAI; [facility] ].   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 OSA- CPAP; Gout of R knee- Nov 2019.    No history exists.     HISTORY OF PRESENTING ILLNESS:  Merla I [age] y.o.  female recently diagnosed bilateral PE and right lower extremity DVT-currently on Eliquis is here for follow-up.  In the interim patient had her IVC filter taken out.  However she has been seen in the hospital for shortness of breath and a CTA-showed no PE.  She also had right lower extremity ultrasound-no DVT.  Noted to have flare of gout right knee; on steroids.  Patient continues to complain of chronic fatigue.  Chronic multiple joint pains.  She also continues to complain of chronic swelling in the right lower extremity.  Not compliant with her stockings.   Review of Systems  Constitutional: Positive for malaise/fatigue. Negative for chills, diaphoresis, fever and weight loss.  HENT: Negative for nosebleeds and sore throat.   Eyes: Negative for double vision.  Respiratory: Negative for cough, hemoptysis, sputum production and wheezing.   Cardiovascular: Positive for leg swelling (Chronic right lower extremity swelling.). Negative for chest pain, palpitations and orthopnea.  Gastrointestinal: Negative for abdominal pain, blood in stool, constipation, diarrhea, heartburn, melena, nausea and vomiting.   Genitourinary: Negative for dysuria, frequency and urgency.  Musculoskeletal: Positive for back pain and joint pain.  Skin: Negative.  Negative for itching and rash.  Neurological: Negative for dizziness, tingling, focal weakness, weakness and headaches.  Endo/Heme/Allergies: Does not bruise/bleed easily.  Psychiatric/Behavioral: Negative for depression. The patient is not nervous/anxious and does not have insomnia.      MEDICAL HISTORY:  Past Medical History:  Diagnosis Date  . Deep vein thrombosis (DVT) ([place])   . Diabetes mellitus, type II ([place])   . Dislocation of hip, congenital   . Fibromyalgia   . Hearing loss    left ear  . Hyperlipidemia   . Hypertension   . Hypothyroidism   . Obesity   . Prediabetes   . Pulmonary embolism ([place]) 01/2017  . Reflux   . Sleep apnea   . Spinal stenosis   . Tendonitis   . Thyroid cancer [facility])     SURGICAL HISTORY: Past Surgical History:  Procedure Laterality Date  . COLONOSCOPY WITH PROPOFOL N/A 02/09/2015   Procedure: COLONOSCOPY WITH PROPOFOL;  Surgeon: [name], MD;  Location: [facility] ENDOSCOPY;  Service: Endoscopy;  Laterality: N/A;  . ESOPHAGOGASTRODUODENOSCOPY N/A 02/09/2015   Procedure: ESOPHAGOGASTRODUODENOSCOPY (EGD);  Surgeon: [name], MD;  Location[facility] ENDOSCOPY;  Service: Endoscopy;  Laterality: N/A;  . HIP SURGERY    . IVC FILTER INSERTION N/A 02/14/2017   Procedure: IVC Filter Insertion;  Surgeon: Katha Cabal, MD;  Location: [place] CV LAB;  Service: Cardiovascular;  Laterality: N/A;  . IVC FILTER REMOVAL N/A 03/24/2018   Procedure: IVC FILTER REMOVAL;  Surgeon: Katha Cabal, MD;  Location:  [place] CV LAB;  Service: Cardiovascular;  Laterality: N/A;  . LIMB SPARING RESECTION HIP W/ SADDLE JOINT REPLACEMENT Bilateral   . THYROID SURGERY    . TOTAL HIP REVISION      SOCIAL HISTORY: Social History   Socioeconomic History  . Marital status: Divorced    Spouse name: Not on file   . Number of children: Not on file  . Years of education: Not on file  . Highest education level: Not on file  Occupational History  . Not on file  Social Needs  . Financial resource strain: Not on file  . Food insecurity:    Worry: Not on file    Inability: Not on file  . Transportation needs:    Medical: Not on file    Non-medical: Not on file  Tobacco Use  . Smoking status: Never Smoker  . Smokeless tobacco: Never Used  Substance and Sexual Activity  . Alcohol use: No    Alcohol/week: 0.0 standard drinks  . Drug use: No  . Sexual activity: Never  Lifestyle  . Physical activity:    Days per week: Not on file    Minutes per session: Not on file  . Stress: Not on file  Relationships  . Social connections:    Talks on phone: Not on file    Gets together: Not on file    Attends religious service: Not on file    Active member of club or organization: Not on file    Attends meetings of clubs or organizations: Not on file    Relationship status: Not on file  . Intimate partner violence:    Fear of current or ex partner: Not on file    Emotionally abused: Not on file    Physically abused: Not on file    Forced sexual activity: Not on file  Other Topics Concern  . Not on file  Social History Narrative  . Not on file    FAMILY HISTORY: Family History  Problem Relation Age of Onset  . Heart disease Mother   . Hypertension Mother   . Alzheimer's disease Mother   . Diabetes Mother   . Stroke Mother   . Anxiety disorder Mother   . Breast cancer Mother        at 2 years  . Diabetes Father   . Alcohol abuse Father   . Bladder Cancer Sister   . Thyroid disease Brother   . Diabetes Daughter   . Diabetes Maternal Aunt     ALLERGIES:  has No Known Allergies.  MEDICATIONS:  Current Outpatient Medications  Medication Sig Dispense Refill  . acetaminophen (TYLENOL) 500 MG tablet Take 1,000 mg by mouth every 6 (six) hours as needed for moderate pain or headache.    Marland Kitchen  apixaban (ELIQUIS) 5 MG TABS tablet Take 1 tablet (5 mg total) by mouth 2 (two) times daily. Start after 6 days. (Patient taking differently: Take 5 mg by mouth 2 (two) times daily. ) 60 tablet 0  . Biotin 10 MG TABS Take 10 mg by mouth daily.     . diazepam (VALIUM) 5 MG tablet Take 5 mg by mouth at bedtime.    . dicyclomine (BENTYL) 10 MG capsule Take 10 mg by mouth every 6 (six) hours.     . gabapentin (NEURONTIN) 300 MG capsule Take 300 mg by mouth 2 (two) times daily.    Marland Kitchen glimepiride (AMARYL) 2 MG tablet Take 2 mg by mouth daily with breakfast.     .  HYDROcodone-acetaminophen (NORCO) 5-325 MG tablet Take 1 tablet by mouth every 6 (six) hours as needed for up to 15 doses for severe pain. 15 tablet 0  . levothyroxine (SYNTHROID, LEVOTHROID) 175 MCG tablet Take 175 mcg by mouth daily before breakfast.   1  . lidocaine (LMX) 4 % cream Apply 1 application topically daily as needed (for pain).    Marland Kitchen linaclotide (LINZESS) 72 MCG capsule TAKE 1 CAPSULE(72 MCG) BY MOUTH EVERY DAY    . lisinopril (PRINIVIL,ZESTRIL) 40 MG tablet Take 40 mg by mouth daily.    . Melatonin 5 MG CAPS Take by mouth.    . morphine (MS CONTIN) 15 MG 12 hr tablet Take 1 tablet (15 mg total) by mouth every 12 (twelve) hours. 30 tablet 0  . pantoprazole (PROTONIX) 40 MG tablet Take 40 mg by mouth 2 (two) times daily.   5  . predniSONE (DELTASONE) 10 MG tablet Take by mouth.    . ranitidine (ZANTAC) 300 MG tablet Take 300 mg by mouth daily with supper.   1  . rosuvastatin (CRESTOR) 40 MG tablet Take 40 mg by mouth daily.     . sitaGLIPtin (JANUVIA) 100 MG tablet Take 100 mg by mouth daily.    . traMADol (ULTRAM) 50 MG tablet Take 1 tablet (50 mg total) by mouth every 6 (six) hours as needed. 20 tablet 0  . Vitamin D, Ergocalciferol, (DRISDOL) 50000 units CAPS capsule Take 50,000 Units by mouth every Wednesday.      No current facility-administered medications for this visit.       Marland Kitchen  PHYSICAL EXAMINATION:   Vitals:    08/17/18 1119  BP: (!) 146/86  Pulse: 71  Resp: 16   Filed Weights   08/17/18 1119  Weight: 181 lb (82.1 kg)    Physical Exam  Constitutional: She is oriented to person, place, and time and well-developed, well-nourished, and in no distress.  Frail-appearing female patient.  She is walks with a cane.  She is alone.  HENT:  Head: Normocephalic and atraumatic.  Mouth/Throat: Oropharynx is clear and moist. No oropharyngeal exudate.  Eyes: Pupils are equal, round, and reactive to light.  Neck: Normal range of motion. Neck supple.  Cardiovascular: Normal rate and regular rhythm.  Pulmonary/Chest: No respiratory distress. She has no wheezes.  Decreased air entry bilaterally.  No wheeze or crackles  Abdominal: Soft. Bowel sounds are normal. She exhibits no distension and no mass. There is no abdominal tenderness. There is no rebound and no guarding.  Musculoskeletal: Normal range of motion.        General: No tenderness or edema.     Comments: Mild swelling of the right lower extremity compared to left.  No tenderness.  Pulses intact.  Neurological: She is alert and oriented to person, place, and time.  Skin: Skin is warm.  Psychiatric: Affect normal.     LABORATORY DATA:  I have reviewed the data as listed Lab Results  Component Value Date   WBC 13.4 (H) 08/17/2018   HGB 14.0 08/17/2018   HCT 43.7 08/17/2018   MCV 86.7 08/17/2018   PLT 285 08/17/2018   Recent Labs    05/22/18 2356 07/27/18 1558 08/17/18 1023  NA 140 142 141  K 4.1 3.7 3.7  CL 104 103 105  CO2 22 29 26   GLUCOSE 168* 96 129*  BUN 9 7 14   CREATININE 0.55 0.51 0.59  CALCIUM 9.3 9.3 9.1  GFRNONAA >60 >60 >60  GFRAA >60 >60 >60  PROT 8.5* 8.1 7.3  ALBUMIN 4.2 4.3 3.8  AST 21 17 15   ALT 22 21 27   ALKPHOS 75 61 58  BILITOT 0.7 0.6 0.4    RADIOGRAPHIC STUDIES: I have personally reviewed the radiological images as listed and agreed with the findings in the report. US Venous Img Lower Unilateral  Right  Result Date: 07/27/2018 CLINICAL DATA:  Right calf pain and edema.  History of prior DVT. EXAM: RIGHT LOWER EXTREMITY VENOUS DOPPLER ULTRASOUND TECHNIQUE: Gray-scale sonography with graded compression, as well as color Doppler and duplex ultrasound were performed to evaluate the lower extremity deep venous systems from the level of the common femoral vein and including the common femoral, femoral, profunda femoral, popliteal and calf veins including the posterior tibial, peroneal and gastrocnemius veins when visible. The superficial great saphenous vein was also interrogated. Spectral Doppler was utilized to evaluate flow at rest and with distal augmentation maneuvers in the common femoral, femoral and popliteal veins. COMPARISON:  02/13/2017 FINDINGS: Contralateral Common Femoral Vein: Respiratory phasicity is normal and symmetric with the symptomatic side. No evidence of thrombus. Normal compressibility. Common Femoral Vein: No evidence of thrombus. Previously identified thrombus has resolved. Normal compressibility, respiratory phasicity and response to augmentation. Saphenofemoral Junction: No evidence of thrombus. Normal compressibility and flow on color Doppler imaging. Profunda Femoral Vein: No evidence of thrombus. Normal compressibility and flow on color Doppler imaging. Femoral Vein: No evidence of thrombus. Previously identified thrombus has resolved. Normal compressibility, respiratory phasicity and response to augmentation. Popliteal Vein: No evidence of thrombus. Normal compressibility, respiratory phasicity and response to augmentation. Calf Veins: No evidence of thrombus. Normal compressibility and flow on color Doppler imaging. Superficial Great Saphenous Vein: No evidence of thrombus. Normal compressibility. Venous Reflux:  None. Other Findings: No evidence of superficial thrombophlebitis or abnormal fluid collection. IMPRESSION: No evidence of right lower extremity deep venous  thrombosis. Previous right common femoral and femoral venous DVT has resolved. Electronically Signed   By: Aletta Edouard M.D.   On: 07/27/2018 16:54    ASSESSMENT & PLAN:   Pulmonary embolism, bilateral (HCC)  # BIL PE-moderate to large clot burden without right heart strain. Patient currently on Eliquis tolerating it well.  Stable.  S/p IVC filter explantation.  Continue indefinite anticoagulation.  #  Right lower extremity postphlebitic syndrome-stable.  Recommend using her compression stockings; lymphedema machine.  #Sharp chest pains musculoskeletal costochondritis/fibromyalgia stable-defer to rheuam/pain management  #History of thyroid cancer-stable.  On Synthroid follows up with endocrinology.; Dr.Solum.   # R LE gout/knee- defer to Rheuma on streoids.  #Mild leukocytosis white count 13 secondary to steroids.  # DISPOSITION:  # follow up in 6 months [patient preference]-cbc/cmp-Dr.B       Cammie Sickle, MD 08/17/2018 1:06 PM

## 2018-08-17 NOTE — Assessment & Plan Note (Addendum)
#   BIL PE-moderate to large clot burden without right heart strain. Patient currently on Eliquis tolerating it well.  Stable.  S/p IVC filter explantation.  Continue indefinite anticoagulation.  #  Right lower extremity postphlebitic syndrome-stable.  Recommend using her compression stockings; lymphedema machine.  #Sharp chest pains musculoskeletal costochondritis/fibromyalgia stable-defer to rheuam/pain management  #History of thyroid cancer-stable.  On Synthroid follows up with endocrinology.; Dr.Solum.   # R LE gout/knee- defer to Rheuma on streoids.  #Mild leukocytosis white count 13 secondary to steroids.  # DISPOSITION:  # follow up in 6 months [patient preference]-cbc/cmp-Dr.B

## 2018-10-19 ENCOUNTER — Encounter (INDEPENDENT_AMBULATORY_CARE_PROVIDER_SITE_OTHER): Payer: Self-pay | Admitting: Vascular Surgery

## 2018-10-19 ENCOUNTER — Other Ambulatory Visit: Payer: Self-pay

## 2018-10-19 ENCOUNTER — Ambulatory Visit (INDEPENDENT_AMBULATORY_CARE_PROVIDER_SITE_OTHER): Payer: Medicare Other

## 2018-10-19 ENCOUNTER — Ambulatory Visit (INDEPENDENT_AMBULATORY_CARE_PROVIDER_SITE_OTHER): Payer: Medicare Other | Admitting: Vascular Surgery

## 2018-10-19 VITALS — BP 142/81 | HR 83 | Resp 12 | Ht <= 58 in | Wt 180.0 lb

## 2018-10-19 DIAGNOSIS — I87009 Postthrombotic syndrome without complications of unspecified extremity: Secondary | ICD-10-CM | POA: Diagnosis not present

## 2018-10-19 DIAGNOSIS — E119 Type 2 diabetes mellitus without complications: Secondary | ICD-10-CM | POA: Diagnosis not present

## 2018-10-19 DIAGNOSIS — I10 Essential (primary) hypertension: Secondary | ICD-10-CM | POA: Diagnosis not present

## 2018-10-19 DIAGNOSIS — I82511 Chronic embolism and thrombosis of right femoral vein: Secondary | ICD-10-CM | POA: Diagnosis not present

## 2018-10-19 DIAGNOSIS — I2699 Other pulmonary embolism without acute cor pulmonale: Secondary | ICD-10-CM | POA: Diagnosis not present

## 2018-10-19 DIAGNOSIS — E782 Mixed hyperlipidemia: Secondary | ICD-10-CM

## 2018-10-19 NOTE — Progress Notes (Signed)
MRN : [id number]  Katie Baker is a 58 y.o. ([birth year]) female who presents with chief complaint of No chief complaint on file. Marland Kitchen  History of Present Illness:    The patient presents to the office for evaluation of DVT.  DVT was identified at [facility] by Duplex ultrasound in June 2018.  The initial symptoms were SOB secondary to her PE's associated with  pain and swelling in the lower extremity.  The IVC filter was removed  03/24/2018.  Symptoms are much better with elevation.  The patient notes minimal edema in the morning which steadily worsens throughout the day.    The patient has been using compression therapy at this point and has been using her Lymph pump twice per day.  She continues to use a hospital bed and elevates her feet at night.  No SOB or pleuritic chest pains.  No cough or hemoptysis.  No blood per rectum or blood in any sputum.  No excessive bruising per the patient.  She is newly diagnosed with a gout.       No outpatient medications have been marked as taking for the 10/19/18 encounter (Appointment) with Delana Meyer, Dolores Lory, MD.    Past Medical History:  Diagnosis Date  . Deep vein thrombosis (DVT) ([place])   . Diabetes mellitus, type II ([place])   . Dislocation of hip, congenital   . Fibromyalgia   . Hearing loss    left ear  . Hyperlipidemia   . Hypertension   . Hypothyroidism   . Obesity   . Prediabetes   . Pulmonary embolism ([place]) 01/2017  . Reflux   . Sleep apnea   . Spinal stenosis   . Tendonitis   . Thyroid cancer [facility])     Past Surgical History:  Procedure Laterality Date  . COLONOSCOPY WITH PROPOFOL N/A 02/09/2015   Procedure: COLONOSCOPY WITH PROPOFOL;  Surgeon: [name], MD;  Location: [facility] ENDOSCOPY;  Service: Endoscopy;  Laterality: N/A;  . ESOPHAGOGASTRODUODENOSCOPY N/A 02/09/2015   Procedure: ESOPHAGOGASTRODUODENOSCOPY (EGD);  Surgeon: [name], MD;  Location: [facility] ENDOSCOPY;  Service: Endoscopy;  Laterality: N/A;    . HIP SURGERY    . IVC FILTER INSERTION N/A 02/14/2017   Procedure: IVC Filter Insertion;  Surgeon: Katha Cabal, MD;  Location: [place] CV LAB;  Service: Cardiovascular;  Laterality: N/A;  . IVC FILTER REMOVAL N/A 03/24/2018   Procedure: IVC FILTER REMOVAL;  Surgeon: Katha Cabal, MD;  Location: [place] CV LAB;  Service: Cardiovascular;  Laterality: N/A;  . LIMB SPARING RESECTION HIP W/ SADDLE JOINT REPLACEMENT Bilateral   . THYROID SURGERY    . TOTAL HIP REVISION      Social History Social History   Tobacco Use  . Smoking status: Never Smoker  . Smokeless tobacco: Never Used  Substance Use Topics  . Alcohol use: No    Alcohol/week: 0.0 standard drinks  . Drug use: No    Family History Family History  Problem Relation Age of Onset  . Heart disease Mother   . Hypertension Mother   . Alzheimer's disease Mother   . Diabetes Mother   . Stroke Mother   . Anxiety disorder Mother   . Breast cancer Mother        at 4 years  . Diabetes Father   . Alcohol abuse Father   . Bladder Cancer Sister   . Thyroid disease Brother   . Diabetes Daughter   . Diabetes Maternal Aunt  No Known Allergies   REVIEW OF SYSTEMS (Negative unless checked)  Constitutional: [] Weight loss  [] Fever  [] Chills Cardiac: [] Chest pain   [] Chest pressure   [] Palpitations   [] Shortness of breath when laying flat   [] Shortness of breath with exertion. Vascular:  [x] Pain in legs with walking   [] Pain in legs at rest  [x] History of DVT   [] Phlebitis   [x] Swelling in legs   [] Varicose veins   [] Non-healing ulcers Pulmonary:   [] Uses home oxygen   [] Productive cough   [] Hemoptysis   [] Wheeze  [] COPD   [] Asthma Neurologic:  [] Dizziness   [] Seizures   [] History of stroke   [] History of TIA  [] Aphasia   [] Vissual changes   [] Weakness or numbness in arm   [] Weakness or numbness in leg Musculoskeletal:   [x] Joint swelling   [x] Joint pain   [] Low back pain Hematologic:  [x] Easy bruising   [] Easy bleeding   [] Hypercoagulable state   [] Anemic Gastrointestinal:  [] Diarrhea   [] Vomiting  [] Gastroesophageal reflux/heartburn   [] Difficulty swallowing. Genitourinary:  [] Chronic kidney disease   [] Difficult urination  [] Frequent urination   [] Blood in urine Skin:  [] Rashes   [] Ulcers  Psychological:  [] History of anxiety   []  History of major depression.  Physical Examination  There were no vitals filed for this visit. There is no height or weight on file to calculate BMI. Gen: WD/WN, NAD Head: [place]/AT, No temporalis wasting.  Ear/Nose/Throat: Hearing grossly intact, nares w/o erythema or drainage Eyes: PER, EOMI, sclera nonicteric.  Neck: Supple, no large masses.   Pulmonary:  Good air movement, no audible wheezing bilaterally, no use of accessory muscles.  Cardiac: RRR, no JVD Vascular:scattered varicosities present bilaterally.  Mild venous stasis changes to the legs bilaterally.  3+ soft pitting edema Vessel Right Left  Radial Palpable Palpable  PT Palpable Palpable  DP Palpable Palpable  Gastrointestinal: Non-distended. No guarding/no peritoneal signs.  Musculoskeletal: M/S 5/5 throughout.  No deformity or atrophy.  Neurologic: CN 2-12 intact. Symmetrical.  Speech is fluent. Motor exam as listed above. Psychiatric: Judgment intact, Mood & affect appropriate for pt's clinical situation. Dermatologic: venous rashes no ulcers noted.  No changes consistent with cellulitis. Lymph : No lichenification or skin changes of chronic lymphedema.  CBC Lab Results  Component Value Date   WBC 13.4 (H) 08/17/2018   HGB 14.0 08/17/2018   HCT 43.7 08/17/2018   MCV 86.7 08/17/2018   PLT 285 08/17/2018    BMET    Component Value Date/Time   NA 141 08/17/2018 1023   NA 141 10/01/2011 0721   K 3.7 08/17/2018 1023   K 3.6 10/01/2011 0721   CL 105 08/17/2018 1023   CL 104 10/01/2011 0721   CO2 26 08/17/2018 1023   CO2 28 10/01/2011 0721   GLUCOSE 129 (H) 08/17/2018 1023    GLUCOSE 111 (H) 10/01/2011 0721   BUN 14 08/17/2018 1023   BUN 10 10/01/2011 0721   CREATININE 0.59 08/17/2018 1023   CREATININE 0.51 (L) 10/01/2011 0721   CALCIUM 9.1 08/17/2018 1023   CALCIUM 9.1 10/01/2011 0721   GFRNONAA >60 08/17/2018 1023   GFRNONAA >60 10/01/2011 0721   GFRAA >60 08/17/2018 1023   GFRAA >60 10/01/2011 0721   CrCl cannot be calculated (Patient's most recent lab result is older than the maximum 21 days allowed.).  COAG Lab Results  Component Value Date   INR 0.96 02/12/2017    Radiology No results found.   Assessment/Plan 1. Pulmonary embolism, bilateral ([place]) Recommend:  No surgery or intervention at this point in time.    I have reviewed my previous discussion with the patient regarding swelling and why it causes symptoms.  Patient will continue wearing graduated compression stockings class 1 (20-30 mmHg) on a daily basis. The patient will  beginning wearing the stockings first thing in the morning and removing them in the evening. The patient is instructed specifically not to sleep in the stockings.    Anticoagulation will continue lifelong.  In addition, behavioral modification including several periods of elevation of the lower extremities during the day will be continued.  This was reviewed with the patient during the initial visit.  The patient will also continue routine exercise, especially walking on a daily basis as was discussed during the initial visit.    Despite conservative treatments including graduated compression therapy class 1 and behavioral modification including exercise and elevation the patient  has not obtained adequate control of the lymphedema.  The patient still has stage 3 lymphedema and therefore, I believe that a lymph pump should be added to improve the control of the patient's lymphedema.  Patient should follow-up in six months    A total of 30 minutes was spent with this patient and greater than 50% was spent in  counseling and coordination of care with the patient.  Discussion included the treatment options for vascular disease including indications for surgery and intervention.  Also discussed is the appropriate timing of treatment.  In addition medical therapy was discussed.  2. Post-phlebitic syndrome  No surgery or intervention at this point in time.    I have reviewed my discussion with the patient regarding lymphedema and why it  causes symptoms.  Patient will continue wearing graduated compression stockings class 1 (20-30 mmHg) on a daily basis a prescription was given. The patient is reminded to put the stockings on first thing in the morning and removing them in the evening. The patient is instructed specifically not to sleep in the stockings.   In addition, behavioral modification throughout the day will be continued.  This will include frequent elevation (such as in a recliner), use of over the counter pain medications as needed and exercise such as walking.  I have reviewed systemic causes for chronic edema such as liver, kidney and cardiac etiologies and there does not appear to be any significant changes in these organ systems over the past year.  The patient is under the impression that these organ systems are all stable and unchanged.    The patient will continue aggressive use of the  lymph pump.  This will continue to improve the edema control and prevent sequela such as ulcers and infections.   The patient will follow-up with me on an annual basis.    3. Essential hypertension Continue antihypertensive medications as already ordered, these medications have been reviewed and there are no changes at this time.   4. Type 2 diabetes mellitus without complication, without long-term current use of insulin (HCC) Continue hypoglycemic medications as already ordered, these medications have been reviewed and there are no changes at this time.  Hgb A1C to be monitored as already arranged by  primary service   5. Mixed hyperlipidemia Continue statin as ordered and reviewed, no changes at this time     Hortencia Pilar, MD  10/19/2018 9:42 AM

## 2018-10-21 DIAGNOSIS — I83893 Varicose veins of bilateral lower extremities with other complications: Secondary | ICD-10-CM | POA: Insufficient documentation

## 2018-10-21 DIAGNOSIS — M1120 Other chondrocalcinosis, unspecified site: Secondary | ICD-10-CM | POA: Insufficient documentation

## 2018-10-21 DIAGNOSIS — Z79899 Other long term (current) drug therapy: Secondary | ICD-10-CM | POA: Insufficient documentation

## 2018-11-04 ENCOUNTER — Other Ambulatory Visit: Payer: Self-pay | Admitting: Student

## 2018-11-04 DIAGNOSIS — K760 Fatty (change of) liver, not elsewhere classified: Secondary | ICD-10-CM

## 2018-11-16 ENCOUNTER — Other Ambulatory Visit (INDEPENDENT_AMBULATORY_CARE_PROVIDER_SITE_OTHER): Payer: Self-pay | Admitting: Vascular Surgery

## 2019-01-06 ENCOUNTER — Telehealth: Payer: Self-pay

## 2019-01-06 ENCOUNTER — Encounter: Payer: Self-pay | Admitting: Cardiovascular Disease

## 2019-01-06 NOTE — Telephone Encounter (Signed)
Spoke with patient to schedule overdue F/U with Dr. Fletcher Anon. Patient states she would like to reschedule however right now she is at [place] with her daughter who is having surgery so she would like Dr. Tyrell Antonio information sent to her so she can call back. Arley Phenix to mail letter.

## 2019-01-14 DIAGNOSIS — E119 Type 2 diabetes mellitus without complications: Secondary | ICD-10-CM | POA: Insufficient documentation

## 2019-01-20 ENCOUNTER — Encounter: Payer: Self-pay | Admitting: Cardiovascular Disease

## 2019-02-16 ENCOUNTER — Inpatient Hospital Stay: Payer: Medicare Other | Admitting: Internal Medicine

## 2019-02-16 ENCOUNTER — Inpatient Hospital Stay: Payer: Medicare Other

## 2019-03-01 ENCOUNTER — Other Ambulatory Visit: Payer: Self-pay

## 2019-03-02 ENCOUNTER — Inpatient Hospital Stay: Payer: Medicare Other | Attending: Internal Medicine

## 2019-03-02 ENCOUNTER — Other Ambulatory Visit: Payer: Self-pay

## 2019-03-02 DIAGNOSIS — I2699 Other pulmonary embolism without acute cor pulmonale: Secondary | ICD-10-CM

## 2019-03-02 LAB — COMPREHENSIVE METABOLIC PANEL
ALT: 34 U/L (ref 0–44)
AST: 36 U/L (ref 15–41)
Albumin: 4.2 g/dL (ref 3.5–5.0)
Alkaline Phosphatase: 67 U/L (ref 38–126)
Anion gap: 10 (ref 5–15)
BUN: 12 mg/dL (ref 6–20)
CO2: 23 mmol/L (ref 22–32)
Calcium: 9 mg/dL (ref 8.9–10.3)
Chloride: 108 mmol/L (ref 98–111)
Creatinine, Ser: 0.56 mg/dL (ref 0.44–1.00)
GFR calc Af Amer: >60 mL/min (ref >60)
GFR calc non Af Amer: >60 mL/min (ref >60)
Glucose, Bld: 182 mg/dL — ABNORMAL HIGH (ref 70–99)
Potassium: 3.6 mmol/L (ref 3.5–5.1)
Sodium: 141 mmol/L (ref 135–145)
Total Bilirubin: 0.5 mg/dL (ref 0.3–1.2)
Total Protein: 7.7 g/dL (ref 6.5–8.1)

## 2019-03-02 LAB — CBC WITH DIFFERENTIAL/PLATELET
Abs Immature Granulocytes: 0.02 10*3/uL (ref 0.00–0.07)
Basophils Absolute: 0.1 10*3/uL (ref 0.0–0.1)
Basophils Relative: 1 %
Eosinophils Absolute: 0.2 10*3/uL (ref 0.0–0.5)
Eosinophils Relative: 2 %
HCT: 41.5 % (ref 36.0–46.0)
Hemoglobin: 13.4 g/dL (ref 12.0–15.0)
Immature Granulocytes: 0 %
Lymphocytes Relative: 30 %
Lymphs Abs: 2.2 10*3/uL (ref 0.7–4.0)
MCH: 28 pg (ref 26.0–34.0)
MCHC: 32.3 g/dL (ref 30.0–36.0)
MCV: 86.8 fL (ref 80.0–100.0)
Monocytes Absolute: 0.5 10*3/uL (ref 0.1–1.0)
Monocytes Relative: 7 %
Neutro Abs: 4.4 10*3/uL (ref 1.7–7.7)
Neutrophils Relative %: 60 %
Platelets: 231 10*3/uL (ref 150–400)
RBC: 4.78 MIL/uL (ref 3.87–5.11)
RDW: 13.7 % (ref 11.5–15.5)
WBC: 7.4 10*3/uL (ref 4.0–10.5)
nRBC: 0 % (ref 0.0–0.2)

## 2019-03-03 ENCOUNTER — Telehealth: Payer: Self-pay | Admitting: Internal Medicine

## 2019-03-04 ENCOUNTER — Inpatient Hospital Stay: Payer: Medicare Other | Attending: Internal Medicine | Admitting: Internal Medicine

## 2019-03-04 DIAGNOSIS — Z7901 Long term (current) use of anticoagulants: Secondary | ICD-10-CM

## 2019-03-04 DIAGNOSIS — I2699 Other pulmonary embolism without acute cor pulmonale: Secondary | ICD-10-CM | POA: Diagnosis not present

## 2019-03-04 NOTE — Assessment & Plan Note (Addendum)
#   BIL PE-moderate to large clot burden without right heart strain. On Eliquis tolerating it well.  Stable.  Continue indefinite anticoagulation.   #  Right lower extremity postphlebitic syndrome-stable continue using her compression stockings; lymphedema machine.  #History of thyroid cancer- stable.   On Synthroid follows up with endocrinology.; Dr.Solum.   # R LE gout/knee- defer to Rheuma on steroids /defer to rheumatology.  A  # defer to pain management- defer PCP.   #Insomnia/facial puffiness/gaining weight-likely secondary to steroids.  Defer to rheumatology PCP for further taper of the steroids.  # DISPOSITION:  # follow up in 6 months- MD [patient preference]-cbc/cmp-Dr.B

## 2019-03-04 NOTE — Progress Notes (Signed)
Patient verified using two identifiers for virtual visit via telephone today.  Patient does not offer any problems related to the PE diagnosis

## 2019-03-04 NOTE — Progress Notes (Signed)
I connected with LUNELL ROBART on 03/04/2019 at  9:45 AM EDT by video enabled telemedicine visit and verified that I am speaking with the correct person using two identifiers.  I discussed the limitations, risks, security and privacy concerns of performing an evaluation and management service by telemedicine and the availability of in-person appointments. I also discussed with the patient that there may be a patient responsible charge related to this service. The patient expressed understanding and agreed to proceed.    Other persons participating in the visit and their role in the encounter: RN/medical reconcilaition  Patient's location: home  Provider's location: office   # June MID 2018 Bil PE mod-large/Right LE DVT- [immobility sec to chronic arthritis/ unprovoked] s/p IVC filter [Dr.Schneir] on Eliquis. Sep 2019-  IVC filter explantation.  # DEC 2011- Thyroid cancer s/p sub total thyroidectomy & DM-2 [Dr.Solum; s/p  RAI; [facility] ].   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 OSA- CPAP; Gout of R knee- Nov 2019.   Oncology History   No history exists.     Chief Complaint: DVT/PE    History of present illness:Katie Baker 58 y.o.  female with history of bilateral PE/DVT currently on indefinite anticoagulation is here for follow-up.  Patient continues to complain of chronic joint pains muscle pain.  She is currently on steroids.  Patient complains of facial swelling and also puffiness.  Also complains of difficulty sleeping at night.  Also complains of intermittent chest tightness which comes and goes every 3 to 4 weeks.  Observation/objective:  Assessment and plan: Pulmonary embolism, bilateral ([place])  # BIL PE-moderate to large clot burden without right heart strain. On Eliquis tolerating it well.  Stable.  Continue indefinite anticoagulation.   #  Right lower extremity postphlebitic syndrome-stable  continue using her compression stockings; lymphedema machine.  #History of thyroid cancer- stable.   On Synthroid follows up with endocrinology.; Dr.Solum.   # R LE gout/knee- defer to Rheuma on steroids /defer to rheumatology.  A  # defer to pain management- defer PCP.   #Insomnia/facial puffiness/gaining weight-likely secondary to steroids.  Defer to rheumatology PCP for further taper of the steroids.  # DISPOSITION:  # follow up in 6 months- MD [patient preference]-cbc/cmp-Dr.B    Follow-up instructions:  I discussed the assessment and treatment plan with the patient.  The patient was provided an opportunity to ask questions and all were answered.  The patient agreed with the plan and demonstrated understanding of instructions.  The patient was advised to call back or seek an in person evaluation if the symptoms worsen or if the condition fails to improve as anticipated.  I provided 12 minutes of face-to-face video visit time during this encounter, and > 50% was spent counseling as documented under my assessment & plan.   Dr. Charlaine Dalton [place] at [facility] 03/16/2019 8:05 PM

## 2019-03-08 ENCOUNTER — Ambulatory Visit: Payer: Medicare Other | Admitting: Internal Medicine

## 2019-04-21 DIAGNOSIS — M159 Polyosteoarthritis, unspecified: Secondary | ICD-10-CM | POA: Insufficient documentation

## 2019-04-26 ENCOUNTER — Encounter (INDEPENDENT_AMBULATORY_CARE_PROVIDER_SITE_OTHER): Payer: Self-pay | Admitting: Vascular Surgery

## 2019-04-26 ENCOUNTER — Ambulatory Visit (INDEPENDENT_AMBULATORY_CARE_PROVIDER_SITE_OTHER): Payer: Medicare Other | Admitting: Vascular Surgery

## 2019-04-26 ENCOUNTER — Other Ambulatory Visit: Payer: Self-pay

## 2019-04-26 VITALS — BP 132/60 | HR 74 | Resp 14 | Ht <= 58 in | Wt 180.0 lb

## 2019-04-26 DIAGNOSIS — Z86711 Personal history of pulmonary embolism: Secondary | ICD-10-CM | POA: Diagnosis not present

## 2019-04-26 DIAGNOSIS — R252 Cramp and spasm: Secondary | ICD-10-CM

## 2019-04-26 DIAGNOSIS — I890 Lymphedema, not elsewhere classified: Secondary | ICD-10-CM

## 2019-04-26 DIAGNOSIS — I10 Essential (primary) hypertension: Secondary | ICD-10-CM

## 2019-04-26 DIAGNOSIS — I87009 Postthrombotic syndrome without complications of unspecified extremity: Secondary | ICD-10-CM | POA: Diagnosis not present

## 2019-04-26 DIAGNOSIS — E782 Mixed hyperlipidemia: Secondary | ICD-10-CM

## 2019-04-26 NOTE — Progress Notes (Signed)
MRN : [id number]  Katie Baker is a 58 y.o. (05/17/1961) female who presents with chief complaint of No chief complaint on file. Marland Kitchen  History of Present Illness:   The patient presents to the office for evaluation of DVT.    DVT was identified at [facility] by Duplex ultrasound in June 2018.  The initial symptoms were SOB secondary to her PE's associated with  pain and swelling in the lower extremity.  The IVC filter was removed  03/24/2018.  Symptoms are much better with elevation.  The patient notes minimal edema in the morning which steadily worsens throughout the day.    The patient has been using compression therapy at this point and has been using her Lymph pump twice per day.  She continues to use a hospital bed and elevates her feet at night.  No SOB or pleuritic chest pains.  No cough or hemoptysis.  No blood per rectum or blood in any sputum.  No excessive bruising per the patient.  As a new complaint this visit she is noting leg cramps.  Sometimes during the day sometimes at night no particular pattern.  Both legs seem to be affected.  She was newly diagnosed with a gout at the time of her last visit wIn this case w continued to be a problem for her causing leg pains.  She is currently taking allopurinol daily.  No outpatient medications have been marked as taking for the 04/26/19 encounter (Appointment) with Delana Meyer, Dolores Lory, MD.    Past Medical History:  Diagnosis Date  . Deep vein thrombosis (DVT) ([place])   . Diabetes mellitus, type II ([place])   . Dislocation of hip, congenital   . Fibromyalgia   . Gout   . Hearing loss    left ear  . Hyperlipidemia   . Hypertension   . Hypothyroidism   . Obesity   . Prediabetes   . Pulmonary embolism ([place]) 01/2017  . Reflux   . Sleep apnea   . Spinal stenosis   . Tendonitis   . Thyroid cancer [facility])     Past Surgical History:  Procedure Laterality Date  . COLONOSCOPY WITH PROPOFOL N/A 02/09/2015   Procedure:  COLONOSCOPY WITH PROPOFOL;  Surgeon: [name], MD;  Location: [facility] ENDOSCOPY;  Service: Endoscopy;  Laterality: N/A;  . ESOPHAGOGASTRODUODENOSCOPY N/A 02/09/2015   Procedure: ESOPHAGOGASTRODUODENOSCOPY (EGD);  Surgeon: [name], MD;  Location: [facility] ENDOSCOPY;  Service: Endoscopy;  Laterality: N/A;  . HIP SURGERY    . IVC FILTER INSERTION N/A 02/14/2017   Procedure: IVC Filter Insertion;  Surgeon: Katha Cabal, MD;  Location: [place] CV LAB;  Service: Cardiovascular;  Laterality: N/A;  . IVC FILTER REMOVAL N/A 03/24/2018   Procedure: IVC FILTER REMOVAL;  Surgeon: Katha Cabal, MD;  Location: [place] CV LAB;  Service: Cardiovascular;  Laterality: N/A;  . LIMB SPARING RESECTION HIP W/ SADDLE JOINT REPLACEMENT Bilateral   . THYROID SURGERY    . TOTAL HIP REVISION      Social History Social History   Tobacco Use  . Smoking status: Never Smoker  . Smokeless tobacco: Never Used  Substance Use Topics  . Alcohol use: No    Alcohol/week: 0.0 standard drinks  . Drug use: No    Family History Family History  Problem Relation Age of Onset  . Heart disease Mother   . Hypertension Mother   . Alzheimer's disease Mother   . Diabetes Mother   . Stroke Mother   .  Anxiety disorder Mother   . Breast cancer Mother        at 77 years  . Diabetes Father   . Alcohol abuse Father   . Bladder Cancer Sister   . Thyroid disease Brother   . Diabetes Daughter   . Diabetes Maternal Aunt     No Known Allergies   REVIEW OF SYSTEMS (Negative unless checked)  Constitutional: [] Weight loss  [] Fever  [] Chills Cardiac: [] Chest pain   [] Chest pressure   [] Palpitations   [] Shortness of breath when laying flat   [] Shortness of breath with exertion. Vascular:  [] Pain in legs with walking   [x] Pain in legs at rest  [x] History of DVT   [] Phlebitis   [x] Swelling in legs   [] Varicose veins   [] Non-healing ulcers Pulmonary:   [] Uses home oxygen   [] Productive cough    [] Hemoptysis   [] Wheeze  [] COPD   [] Asthma Neurologic:  [] Dizziness   [] Seizures   [] History of stroke   [] History of TIA  [] Aphasia   [] Vissual changes   [] Weakness or numbness in arm   [x] Weakness or numbness in leg Musculoskeletal:   [] Joint swelling   [x] Joint pain   [] Low back pain Hematologic:  [] Easy bruising  [] Easy bleeding   [x] Hypercoagulable state   [] Anemic Gastrointestinal:  [] Diarrhea   [] Vomiting  [x] Gastroesophageal reflux/heartburn   [] Difficulty swallowing. Genitourinary:  [] Chronic kidney disease   [] Difficult urination  [] Frequent urination   [] Blood in urine Skin:  [] Rashes   [] Ulcers  Psychological:  [] History of anxiety   []  History of major depression.  Physical Examination  There were no vitals filed for this visit. There is no height or weight on file to calculate BMI. Gen: WD/WN, NAD; seen in a wheelchair Head: [place]/AT, No temporalis wasting.  Ear/Nose/Throat: Hearing grossly intact, nares w/o erythema or drainage Eyes: PER, EOMI, sclera nonicteric.  Neck: Supple, no large masses.   Pulmonary:  Good air movement, no audible wheezing bilaterally, no use of accessory muscles.  Cardiac: RRR, no JVD Vascular: scattered varicosities present bilaterally.  Mild venous stasis changes to the legs bilaterally.  2-3+ soft pitting edema Vessel Right Left  PT Palpable Palpable  DP Palpable Palpable  Gastrointestinal: Non-distended. No guarding/no peritoneal signs.  Musculoskeletal: M/S 5/5 throughout.  No deformity or atrophy.  Neurologic: CN 2-12 intact. Symmetrical.  Speech is fluent. Motor exam as listed above. Psychiatric: Judgment intact, Mood & affect appropriate for pt's clinical situation. Dermatologic: venous rashes no ulcers noted.  No changes consistent with cellulitis. Lymph : No lichenification or skin changes of chronic lymphedema.  CBC Lab Results  Component Value Date   WBC 7.4 03/02/2019   HGB 13.4 03/02/2019   HCT 41.5 03/02/2019   MCV 86.8  03/02/2019   PLT 231 03/02/2019    BMET    Component Value Date/Time   NA 141 03/02/2019 1125   NA 141 10/01/2011 0721   K 3.6 03/02/2019 1125   K 3.6 10/01/2011 0721   CL 108 03/02/2019 1125   CL 104 10/01/2011 0721   CO2 23 03/02/2019 1125   CO2 28 10/01/2011 0721   GLUCOSE 182 (H) 03/02/2019 1125   GLUCOSE 111 (H) 10/01/2011 0721   BUN 12 03/02/2019 1125   BUN 10 10/01/2011 0721   CREATININE 0.56 03/02/2019 1125   CREATININE 0.51 (L) 10/01/2011 0721   CALCIUM 9.0 03/02/2019 1125   CALCIUM 9.1 10/01/2011 0721   GFRNONAA >60 03/02/2019 1125   GFRNONAA >60 10/01/2011 0721   GFRAA >60  03/02/2019 1125   GFRAA >60 10/01/2011 0721   CrCl cannot be calculated (Patient's most recent lab result is older than the maximum 21 days allowed.).  COAG Lab Results  Component Value Date   INR 0.96 02/12/2017    Radiology No results found.    Assessment/Plan 1. History of pulmonary embolus (PE) Recommend:  No surgery or intervention at this point in time.   I have reviewed my previous discussion with the patient regarding swelling and why it causes symptoms. Patient will continue wearing graduated compression stockings class 1 (20-30 mmHg) on a daily basis. The patient will beginning wearing the stockings first thing in the morning and removing them in the evening. The patient is instructed specifically not to sleep in the stockings.   Anticoagulation will continue lifelong.  In addition, behavioral modification including several periods of elevation of the lower extremities during the day will be continued. This was reviewed with the patient during the initial visit.  The patient will also continue routine exercise, especially walking on a daily basis as was discussed during the initial visit.   Despite conservative treatments including graduated compression therapy class 1 and behavioral modification including exercise and elevation the patient has not obtained  adequate control of the lymphedema. The patient still has stage 3 lymphedema and therefore, I believe that a lymph pump should be added to improve the control of the patient's lymphedema.  Patient should follow-up in six months   A total of 30 minutes was spent with this patient and greater than 50% was spent in counseling and coordination of care with the patient.  Discussion included the treatment options for vascular disease including indications for surgery and intervention.  Also discussed is the appropriate timing of treatment.  In addition medical therapy was discussed.  2. Post-phlebitic syndrome Recommend:  No surgery or intervention at this point in time.   I have reviewed my previous discussion with the patient regarding swelling and why it causes symptoms. Patient will continue wearing graduated compression stockings class 1 (20-30 mmHg) on a daily basis. The patient will beginning wearing the stockings first thing in the morning and removing them in the evening. The patient is instructed specifically not to sleep in the stockings.   Anticoagulation will continue lifelong.  In addition, behavioral modification including several periods of elevation of the lower extremities during the day will be continued. This was reviewed with the patient during the initial visit.  The patient will also continue routine exercise, especially walking on a daily basis as was discussed during the initial visit.   Despite conservative treatments including graduated compression therapy class 1 and behavioral modification including exercise and elevation the patient has not obtained adequate control of the lymphedema. The patient still has stage 3 lymphedema and therefore, I believe that a lymph pump should be added to improve the control of the patient's lymphedema.  Patient should follow-up in six months   A total of 30 minutes was spent with this patient and greater than 50% was  spent in counseling and coordination of care with the patient.  Discussion included the treatment options for vascular disease including indications for surgery and intervention.  Also discussed is the appropriate timing of treatment.  In addition medical therapy was discussed. - VAS [place] LOWER EXTREMITY VENOUS (DVT); Future  3. Leg cramps Recommend:  The patient is describing Charley horse type leg cramps. No invasive studies, angiography or surgery at this time.    I have reviewed homeopathic  remedies such as Cider vinegar or mustard; placing a bar of soap at the bottom of the bed. Quinine is also an option Magnesium supplementation at bedtime was also reviewed.  The patient should continue walking and begin a more formal exercise program.  The patient should continue antiplatelet therapy and aggressive treatment of the lipid abnormalities  The patient should continue wearing graduated compression socks 10-15 mmHg strength to control any mild edema.  The patient will follow up with me on a PRN basis.   4. Lymphedema  No surgery or intervention at this point in time.    I have reviewed my previous discussion with the patient regarding swelling and why it  causes symptoms.  The patient is doing well with compression and will continue wearing graduated compression stockings class 1 (20-30 mmHg) on a daily basis a prescription was given. The patient will  continue wearing the stockings first thing in the morning and removing them in the evening. The patient is instructed specifically not to sleep in the stockings.    In addition, behavioral modification including elevation during the day and exercise will be continued.    Patient should follow-up on an annual basis   5. Mixed hyperlipidemia Continue statin as ordered and reviewed, no changes at this time   6. Essential hypertension Continue antihypertensive medications as already ordered, these medications have been reviewed and there  are no changes at this time.     Hortencia Pilar, MD  04/26/2019 8:57 AM

## 2019-06-17 ENCOUNTER — Other Ambulatory Visit (INDEPENDENT_AMBULATORY_CARE_PROVIDER_SITE_OTHER): Payer: Self-pay | Admitting: Vascular Surgery

## 2019-06-17 NOTE — Progress Notes (Signed)
Cardiology Office Note    Date:  06/23/2019   ID:  LACIANA Baker, DOB Dec 26, 1960, MRN [id number]  PCP:  Katie Maltese, MD  Cardiologist:  Katie Sacramento, MD  Electrophysiologist:  None   Chief Complaint: Follow up  History of Present Illness:   Katie Baker is a 58 y.o. female with history of DVT/PE in 01/2017 s/p prior IVC filter s/p removal in 03/2018 on lifelong Eliquis, post-phlebitc syndrome, thyroid cancer with post-surgical hypothyroidism with thyroidectomy in 2011, fibromyalgia, DM, HTN, HLD, family history of premature CAD, lymphedema s/p lymph pumps followed by vascular surgery, obesity, sleep apnea, multiple hip surgeries with a relatively sedentary lifestyle, spinal stenosis, OA, gout, and GERD with chronic esophagitis who presents for follow-up of chest pain.  She was admitted in 01/2017 with bilateral PE and DVT affecting the right common femoral and superficial femoral veins. Echo showed normal LV systolic, normal RV size and systolic function. Following this she underwent IVC filter placement with subsequent removal in 03/2018 and has been maintained on indefinite Eliquis.  She was last seen in the office by Dr. Fletcher Anon in 07/2017 for evaluation of chest pain that began at the time of her PE diagnosis with no recent episodes at that time. She continued to note dyspnea with minimal activity. She reported a remote stress test that was apparently normal per her report. Given her symptoms, she underwent Lexiscan Myoview that showed no significant ischemia, EF 73%, overall a low risk scan.   Patient comes in accompanied by her daughter today.  She states she has not done well since she was last seen.  She continues to state she hurts "all over."  She notes a longstanding history of generalized diffuse myalgias and arthralgias.  She continues to note chest pain that occurs randomly and is not associated with exertion.  This is unchanged when compared to her visit in 07/2017.  This is  chest pain that she developed at the time of her PE diagnosis and has been constant and is unchanged.  There is some associated shortness of breath and generalized fatigue.  She has been under increased stress lately with a cousin that recently passed away 2 weeks ago unexpectedly.  She states this was "because of his heart."  Her lifestyle is significantly limited in the setting of her fibromyalgia.   Labs: 05/2019 - potassium 3.9, BUN 12, SCr 0.5, A1c 8.4, TSH 1.513 02/2019 - HGB 13.4, PLT 231, albumin 4.2, AST/ALT normal    Past Medical History:  Diagnosis Date  . Deep vein thrombosis (DVT) ([place])   . Diabetes mellitus, type II ([place])   . Dislocation of hip, congenital   . Fibromyalgia   . Gout   . Hearing loss    left ear  . Hyperlipidemia   . Hypertension   . Hypothyroidism   . Obesity   . Prediabetes   . Pulmonary embolism ([place]) 01/2017  . Reflux   . Sleep apnea   . Spinal stenosis   . Tendonitis   . Thyroid cancer [facility])     Past Surgical History:  Procedure Laterality Date  . COLONOSCOPY WITH PROPOFOL N/A 02/09/2015   Procedure: COLONOSCOPY WITH PROPOFOL;  Surgeon: Katie Class, MD;  Location: [facility] ENDOSCOPY;  Service: Endoscopy;  Laterality: N/A;  . ESOPHAGOGASTRODUODENOSCOPY N/A 02/09/2015   Procedure: ESOPHAGOGASTRODUODENOSCOPY (EGD);  Surgeon: Katie Class, MD;  Location: [facility] ENDOSCOPY;  Service: Endoscopy;  Laterality: N/A;  . HIP SURGERY    . IVC FILTER INSERTION  N/A 02/14/2017   Procedure: IVC Filter Insertion;  Surgeon: Katie Cabal, MD;  Location: [place] CV LAB;  Service: Cardiovascular;  Laterality: N/A;  . IVC FILTER REMOVAL N/A 03/24/2018   Procedure: IVC FILTER REMOVAL;  Surgeon: Katie Cabal, MD;  Location: [place] CV LAB;  Service: Cardiovascular;  Laterality: N/A;  . LIMB SPARING RESECTION HIP W/ SADDLE JOINT REPLACEMENT Bilateral   . THYROID SURGERY    . TOTAL HIP REVISION      Current Medications: Current Meds   Medication Sig  . acetaminophen (TYLENOL) 500 MG tablet Take 1,000 mg by mouth every 6 (six) hours as needed for moderate pain or headache.  . allopurinol (ZYLOPRIM) 100 MG tablet One tablet daily, 90  days  . Biotin 10 MG TABS Take 10 mg by mouth daily.   . dapagliflozin propanediol (FARXIGA) 5 MG TABS tablet Take 10 mg by mouth daily.   Marland Kitchen dicyclomine (BENTYL) 10 MG capsule Take 20 mg by mouth every 6 (six) hours.   Marland Kitchen ELIQUIS 5 MG TABS tablet TAKE ONE TABLET BY MOUTH TWICE DAILY  . gabapentin (NEURONTIN) 300 MG capsule Take 300 mg by mouth 2 (two) times daily.  Marland Kitchen glimepiride (AMARYL) 2 MG tablet Take 2 mg by mouth daily with breakfast.   . levothyroxine (SYNTHROID, LEVOTHROID) 175 MCG tablet Take 175 mcg by mouth daily before breakfast.   . lisinopril (PRINIVIL,ZESTRIL) 40 MG tablet Take 40 mg by mouth daily.  . Melatonin 5 MG CAPS Take by mouth at bedtime.   Marland Kitchen morphine (MS CONTIN) 15 MG 12 hr tablet Take 1 tablet (15 mg total) by mouth every 12 (twelve) hours.  . pantoprazole (PROTONIX) 40 MG tablet Take 40 mg by mouth 2 (two) times daily.   . RESTASIS 0.05 % ophthalmic emulsion   . rosuvastatin (CRESTOR) 40 MG tablet Take 40 mg by mouth daily.   . Semaglutide (OZEMPIC, 0.25 OR 0.5 MG/DOSE, [place]) Inject into the skin as directed.    Allergies:   Patient has no known allergies.   Social History   Socioeconomic History  . Marital status: Divorced    Spouse name: Not on file  . Number of children: Not on file  . Years of education: Not on file  . Highest education level: Not on file  Occupational History  . Not on file  Social Needs  . Financial resource strain: Not on file  . Food insecurity    Worry: Not on file    Inability: Not on file  . Transportation needs    Medical: Not on file    Non-medical: Not on file  Tobacco Use  . Smoking status: Never Smoker  . Smokeless tobacco: Never Used  Substance and Sexual Activity  . Alcohol use: No    Alcohol/week: 0.0 standard  drinks  . Drug use: No  . Sexual activity: Never  Lifestyle  . Physical activity    Days per week: Not on file    Minutes per session: Not on file  . Stress: Not on file  Relationships  . Social Herbalist on phone: Not on file    Gets together: Not on file    Attends religious service: Not on file    Active member of club or organization: Not on file    Attends meetings of clubs or organizations: Not on file    Relationship status: Not on file  Other Topics Concern  . Not on file  Social History Narrative  .  Not on file     Family History:  The patient's family history includes Alcohol abuse in her father; Alzheimer's disease in her mother; Anxiety disorder in her mother; Bladder Cancer in her sister; Breast cancer in her mother; Diabetes in her daughter, father, maternal aunt, and mother; Heart disease in her mother; Hypertension in her mother; Stroke in her mother; Thyroid disease in her brother.  ROS:   Review of Systems  Constitutional: Positive for malaise/fatigue. Negative for chills, diaphoresis, fever and weight loss.  HENT: Negative for congestion.   Eyes: Negative for discharge and redness.  Respiratory: Positive for shortness of breath. Negative for cough, hemoptysis, sputum production and wheezing.   Cardiovascular: Positive for chest pain and leg swelling. Negative for palpitations, orthopnea, claudication and PND.  Gastrointestinal: Negative for abdominal pain, blood in stool, heartburn, melena, nausea and vomiting.  Genitourinary: Negative for hematuria.  Musculoskeletal: Positive for back pain, joint pain, myalgias and neck pain. Negative for falls.  Skin: Negative for rash.  Neurological: Positive for weakness. Negative for dizziness, tingling, tremors, sensory change, speech change, focal weakness and loss of consciousness.  Endo/Heme/Allergies: Does not bruise/bleed easily.  Psychiatric/Behavioral: Negative for substance abuse. The patient is  nervous/anxious.   All other systems reviewed and are negative.    EKGs/Labs/Other Studies Reviewed:    Studies reviewed were summarized above. The additional studies were reviewed today: As above.   EKG:  EKG is ordered today.  The EKG ordered today demonstrates NSR, 75 bpm, no acute ST-T changes  Recent Labs: 03/02/2019: ALT 34; BUN 12; Creatinine, Ser 0.56; Hemoglobin 13.4; Platelets 231; Potassium 3.6; Sodium 141  Recent Lipid Panel No results found for: CHOL, TRIG, HDL, CHOLHDL, VLDL, LDLCALC, LDLDIRECT  PHYSICAL EXAM:    VS:  BP 140/80 (BP Location: Right Arm, Patient Position: Sitting, Cuff Size: Normal)   Pulse 75   Temp 98.4 F (36.9 C)   Ht 4\' 8"  (1.422 m)   Wt 178 lb 12 oz (81.1 kg)   LMP 07/04/2015 (Approximate) Comment: >2 years ago  SpO2 98%   BMI 40.07 kg/m   BMI: Body mass index is 40.07 kg/m.  Physical Exam  Constitutional: She is oriented to person, place, and time. She appears well-developed and well-nourished.  HENT:  Head: Normocephalic and atraumatic.  Eyes: Right eye exhibits no discharge. Left eye exhibits no discharge.  Neck: Normal range of motion. No JVD present.  Cardiovascular: Normal rate, regular rhythm, S1 normal, S2 normal and normal heart sounds. Exam reveals no distant heart sounds, no friction rub, no midsystolic click and no opening snap.  No murmur heard. Pulses:      Posterior tibial pulses are 2+ on the right side and 2+ on the left side.  Pulmonary/Chest: Effort normal and breath sounds normal. No respiratory distress. She has no decreased breath sounds. She has no wheezes. She has no rales. She exhibits no tenderness.  Abdominal: Soft. She exhibits no distension. There is no abdominal tenderness.  Musculoskeletal:        General: No edema.  Neurological: She is alert and oriented to person, place, and time.  Skin: Skin is warm and dry. No cyanosis. Nails show no clubbing.  Psychiatric: She has a normal mood and affect. Her speech  is normal and behavior is normal. Judgment and thought content normal.    Wt Readings from Last 3 Encounters:  06/23/19 178 lb 12 oz (81.1 kg)  04/26/19 180 lb (81.6 kg)  10/19/18 180 lb (81.6 kg)  ASSESSMENT & PLAN:   1. Chest pain with moderate risk for cardiac etiology/dyspnea: Patient has a longstanding history of constant chest pain that dates back to her diagnosis of PE.  Prior [employer] 2 years ago was unrevealing.  Given persistent symptoms and patient concerned given the recent passing of her cousin unexpectedly we have agreed to proceed with a coronary CTA.  Pending these results would recommend she come in for fasting lipid panel and liver function for further risk stratification.  She remains on Eliquis in place of aspirin given her DVT/PE.  She indicates she has been compliant with Eliquis and denies missing even a single dose.  Obtain echo.  If the above cardiac work-up is unrevealing and if symptoms persist recommend she follow-up with PCP for further evaluation.  Cannot exclude underlying fibromyalgia and anxiety playing a role.  2. History of DVT/PE: Remains on Eliquis indefinitely.  She reports complete compliance.  Follow-up with PCP as directed.  3. Fibromyalgia/anxiety: Cannot exclude this playing a role in her above presentation.  Follow-up with PCP.  4. Hypertension: Blood pressure is mildly elevated today.  No changes were made in the setting of the patient being under increased stress and pain.  Follow-up with PCP.  5. Hyperlipidemia: Patient indicates this is followed by PCP.  No recent lipid panel for review.  If coronary CTA demonstrates significant calcium would recommend the patient come in for fasting lipid and liver function.  Remains on rosuvastatin.  Disposition: F/u with Dr. Fletcher Anon or an APP in 4 weeks.   Medication Adjustments/Labs and Tests Ordered: Current medicines are reviewed at length with the patient today.  Concerns regarding medicines are  outlined above. Medication changes, Labs and Tests ordered today are summarized above and listed in the Patient Instructions accessible in Encounters.   Signed, Christell Faith, PA-C 06/23/2019 11:18 AM     [place] [address] [place] [address] [place], [place] [address] [phone]

## 2019-06-23 ENCOUNTER — Encounter: Payer: Self-pay | Admitting: Physician Assistant

## 2019-06-23 ENCOUNTER — Ambulatory Visit (INDEPENDENT_AMBULATORY_CARE_PROVIDER_SITE_OTHER): Payer: Medicare Other | Admitting: Physician Assistant

## 2019-06-23 ENCOUNTER — Other Ambulatory Visit: Payer: Self-pay

## 2019-06-23 VITALS — BP 140/80 | HR 75 | Temp 98.4°F | Ht <= 58 in | Wt 178.8 lb

## 2019-06-23 DIAGNOSIS — R079 Chest pain, unspecified: Secondary | ICD-10-CM

## 2019-06-23 DIAGNOSIS — M797 Fibromyalgia: Secondary | ICD-10-CM

## 2019-06-23 DIAGNOSIS — R0602 Shortness of breath: Secondary | ICD-10-CM

## 2019-06-23 DIAGNOSIS — I2699 Other pulmonary embolism without acute cor pulmonale: Secondary | ICD-10-CM

## 2019-06-23 DIAGNOSIS — I10 Essential (primary) hypertension: Secondary | ICD-10-CM

## 2019-06-23 DIAGNOSIS — R072 Precordial pain: Secondary | ICD-10-CM

## 2019-06-23 DIAGNOSIS — E782 Mixed hyperlipidemia: Secondary | ICD-10-CM

## 2019-06-23 DIAGNOSIS — F419 Anxiety disorder, unspecified: Secondary | ICD-10-CM

## 2019-06-23 MED ORDER — METOPROLOL TARTRATE 100 MG PO TABS: ORAL_TABLET | ORAL | 0 refills | Status: DC

## 2019-06-23 NOTE — Addendum Note (Signed)
Addended by: Janan Ridge on: 06/23/2019 01:03 PM   Modules accepted: Orders

## 2019-06-23 NOTE — Patient Instructions (Signed)
Medication Instructions:   1. Your physician recommends that you continue on your current medications as directed. Please refer to the Current Medication list given to you today.  *If you need a refill on your cardiac medications before your next appointment, please call your pharmacy*  Lab Work:  1. None Ordered  If you have labs (blood work) drawn today and your tests are completely normal, you will receive your results only by: Marland Kitchen MyChart Message (if you have MyChart) OR . A paper copy in the mail If you have any lab test that is abnormal or we need to change your treatment, we will call you to review the results.  Testing/Procedures:  1. Echocardiogram Please return to [facility] on ______________ at _______________ AM/PM for an Echocardiogram. Your physician has requested that you have an echocardiogram. Echocardiography is a painless test that uses sound waves to create images of your heart. It provides your doctor with information about the size and shape of your heart and how well your heart's chambers and valves are working. This procedure takes approximately one hour. There are no restrictions for this procedure. Please note; depending on visual quality an IV may need to be placed.   2. Your cardiac CT will be scheduled at one of the below locations:   [facility] [address] [place], [place] [address] [phone]   [facility], please arrive at the [facility] main entrance of [facility] 30-45 minutes prior to test start time. Proceed to the [facility] Radiology Department (first floor) to check-in and test prep.  Please follow these instructions carefully (unless otherwise directed):  On the Night Before the Test: . Be sure to Drink plenty of water. . Do not consume any caffeinated/decaffeinated beverages or chocolate 12 hours prior to your test. . Do not take any antihistamines 12 hours prior to your test.  On the  Day of the Test: . Drink plenty of water. Do not drink any water within one hour of the test. . Do not eat any food 4 hours prior to the test. . You may take your regular medications prior to the test.  . Take metoprolol (Lopressor) two hours prior to test. . FEMALES- please wear underwire-free bra if available   *For Clinical Staff only. Please instruct patient the following:*        -Drink plenty of water       -Hold Furosemide/hydrochlorothiazide morning of the test       -Take metoprolol (Lopressor) 2 hours prior to test (if applicable).        After the Test: . Drink plenty of water. . After receiving IV contrast, you may experience a mild flushed feeling. This is normal. . On occasion, you may experience a mild rash up to 24 hours after the test. This is not dangerous. If this occurs, you can take Benadryl 25 mg and increase your fluid intake. . If you experience trouble breathing, this can be serious. If it is severe call 911 IMMEDIATELY. If it is mild, please call our office. . If you take any of these medications: Glipizide/Metformin, Avandament, Glucavance, please do not take 48 hours after completing test unless otherwise instructed.    Please contact the cardiac imaging nurse navigator should you have any questions/concerns Marchia Bond, RN Navigator Cardiac Imaging [facility] [phone] Office      Follow-Up: At [facility], you and your health needs are our priority.  As  part of our continuing mission to provide you with exceptional heart care, we have created designated Provider Care Teams.  These Care Teams include your primary Cardiologist (physician) and Advanced Practice Providers (APPs -  Physician Assistants and Nurse Practitioners) who all work together to provide you with the care you need, when you need it.  Your next appointment:   4 weeks  The format for your next appointment:   In Person  Provider:   Christell Faith,  PA-C

## 2019-06-29 NOTE — Addendum Note (Signed)
Addended by: Lamar Laundry on: 06/29/2019 11:23 AM   Modules accepted: Orders

## 2019-06-30 ENCOUNTER — Telehealth: Payer: Self-pay | Admitting: Physician Assistant

## 2019-06-30 NOTE — Telephone Encounter (Signed)
  Returned call to patient and reported she had not received call about scheduling Cardiac CT. I let her know sometimes it takes a few days to go through precert and she will receive a call from [place].   Pt reports that she would like to go ahead and get it scheduled d/t upcoming appt.   I gave her number for CT scheduling. She will try to call tomorrow.   Advised pt to call for any further questions or concerns.

## 2019-06-30 NOTE — Telephone Encounter (Signed)
Patient daughter states they have not heard anything about scheduling her cardiac CT

## 2019-07-12 ENCOUNTER — Telehealth (HOSPITAL_COMMUNITY): Payer: Self-pay | Admitting: Emergency Medicine

## 2019-07-12 NOTE — Telephone Encounter (Signed)
Reaching out to patient to offer assistance regarding upcoming cardiac imaging study; pt verbalizes understanding of appt date/time, parking situation and where to check in, pre-test NPO status and medications ordered, and verified current allergies; name and call back number provided for further questions should they arise Marchia Bond RN [place] [phone] office [phone] cell  Pt is disabled and will require wheelchair assistance, daughter bringing her to appt

## 2019-07-14 ENCOUNTER — Other Ambulatory Visit: Payer: Self-pay

## 2019-07-14 ENCOUNTER — Ambulatory Visit (HOSPITAL_COMMUNITY)
Admission: RE | Admit: 2019-07-14 | Discharge: 2019-07-14 | Disposition: A | Discharge status: Home / Self Care | Payer: Medicare Other | Source: Ambulatory Visit | Attending: Physician Assistant | Admitting: Physician Assistant

## 2019-07-14 DIAGNOSIS — R072 Precordial pain: Secondary | ICD-10-CM | POA: Insufficient documentation

## 2019-07-14 LAB — POCT I-STAT CREATININE: Creatinine, Ser: 0.5 mg/dL (ref 0.44–1.00)

## 2019-07-14 MED ORDER — NITROGLYCERIN 0.4 MG SL SUBL
0.8000 mg | SUBLINGUAL_TABLET | Freq: Once | SUBLINGUAL | Status: AC
  Administered 2019-07-14: 10:00:00 0.8 mg via SUBLINGUAL

## 2019-07-14 MED ORDER — NITROGLYCERIN 0.4 MG SL SUBL
SUBLINGUAL_TABLET | SUBLINGUAL | Status: AC
  Filled 2019-07-14: qty 2

## 2019-07-14 MED ORDER — IOHEXOL 350 MG/ML SOLN
80.0000 mL | Freq: Once | INTRAVENOUS | Status: AC | PRN
  Administered 2019-07-14: 80 mL via INTRAVENOUS

## 2019-07-15 ENCOUNTER — Telehealth: Payer: Self-pay | Admitting: *Deleted

## 2019-07-15 NOTE — Telephone Encounter (Signed)
-----   Message from Rise Mu, PA-C sent at 07/15/2019  9:15 AM EST ----- Cardiac findings: Calcium score of 0.  Minimal, noncalcified plaque of 1-24% in the LAD.  Continued risk factor modification advised.  She remains on Crestor with HLD followed by PCP.  No further ischemic workup needed at this time.

## 2019-07-15 NOTE — Telephone Encounter (Signed)
Left voicemail message to call back for results.  

## 2019-07-22 ENCOUNTER — Ambulatory Visit (INDEPENDENT_AMBULATORY_CARE_PROVIDER_SITE_OTHER): Payer: Medicare Other

## 2019-07-22 ENCOUNTER — Other Ambulatory Visit
Admission: RE | Admit: 2019-07-22 | Discharge: 2019-07-22 | Disposition: A | Discharge status: Home / Self Care | Payer: Medicare Other | Source: Ambulatory Visit | Attending: Physician Assistant | Admitting: Physician Assistant

## 2019-07-22 ENCOUNTER — Other Ambulatory Visit: Payer: Self-pay

## 2019-07-22 DIAGNOSIS — R079 Chest pain, unspecified: Secondary | ICD-10-CM | POA: Insufficient documentation

## 2019-07-22 LAB — BASIC METABOLIC PANEL
Anion gap: 10 (ref 5–15)
BUN: 15 mg/dL (ref 6–20)
CO2: 25 mmol/L (ref 22–32)
Calcium: 8.9 mg/dL (ref 8.9–10.3)
Chloride: 105 mmol/L (ref 98–111)
Creatinine, Ser: 0.37 mg/dL — ABNORMAL LOW (ref 0.44–1.00)
GFR calc Af Amer: >60 mL/min (ref >60)
GFR calc non Af Amer: >60 mL/min (ref >60)
Glucose, Bld: 93 mg/dL (ref 70–99)
Potassium: 3.9 mmol/L (ref 3.5–5.1)
Sodium: 140 mmol/L (ref 135–145)

## 2019-07-22 NOTE — Telephone Encounter (Signed)
Left voicemail message to call back  

## 2019-07-25 NOTE — Progress Notes (Signed)
Cardiology Office Note    Date:  07/26/2019   ID:  Katie Baker, DOB March 11, 1961, MRN [id number]  PCP:  Perrin Maltese, MD  Cardiologist:  Kathlyn Sacramento, MD  Electrophysiologist:  None   Chief Complaint: Follow up  History of Present Illness:   Katie Baker is a 58 y.o. 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  She was admitted in 01/2017 with bilateral PE and DVT affecting the right common femoral and superficial femoral veins. Echo showed normal LV systolic, normal RV size and systolic function. Following this she underwent IVC filter placement with subsequent removal in 03/2018 and has been maintained on indefinite Eliquis.  She was seen in the office by Dr. Fletcher Anon in 07/2017 for evaluation of chest pain that began at the time of her PE diagnosis with no recent episodes at that time. She continued to note dyspnea with minimal activity. She reported a remote stress test that was apparently normal per her report. Given her symptoms, she underwent Lexiscan Myoview that showed no significant ischemia, EF 73%, overall a low risk scan.   I saw her on 06/23/2019 with the patient noting she hurts "all over."  She noted a longstanding history of generalized diffuse myalgias and arthralgias.  She continued to note chest pain that occurred randomly.  This was unchanged when compared to her visit in 07/2017.  There was some associated shortness of breath and generalized fatigue.  Given her persistent symptoms, she  underwent a coronary CTA on 07/15/2019 that showed a calcium score of 0 with no obstructive CAD. Echo on 07/26/2019 showed an of of 60-65% with no significant valvular abnormalities.   She comes in today feeling about the same as she did last month.  She continues to note intermittent chest discomfort that dates back to her diagnosis of PE.  She also notes continued generalized arthralgias and myalgias that also are unchanged.  In this setting, her functional status is quite limited.  She reads the posterior in our exam room regarding PAD and wonder if she has this now and requests further evaluation.  She does not really complain of symptoms concerning for claudication, though this is difficult to assess secondary to her limited functional status.  She also notes intermittent numbness involving the left arm and reports a history of prior cervical spine stenosis.  She is not fasting this morning.   Labs: 07/2019 - potassium 3.9, BUN 15, SCr 0.37 05/2019 - A1c 8.4, TSH 1.513 02/2019 - HGB 13.4, PLT 231, albumin 4.2, AST/ALT   Past Medical History:  Diagnosis Date  . Deep vein thrombosis (DVT) ([place])   . Diabetes mellitus, type II ([place])   . Dislocation of hip, congenital   . Fibromyalgia   . Gout   . Hearing loss    left ear  . Hyperlipidemia   . Hypertension   . Hypothyroidism   . Obesity   . Prediabetes   . Pulmonary embolism ([place]) 01/2017  . Reflux   . Sleep apnea   . Spinal stenosis   . Tendonitis   . Thyroid cancer ([place])  Past Surgical History:  Procedure Laterality Date  . COLONOSCOPY WITH PROPOFOL N/A 02/09/2015   Procedure: COLONOSCOPY WITH PROPOFOL;  Surgeon: [name], MD;  Location[facility] ENDOSCOPY;  Service: Endoscopy;  Laterality: N/A;  . ESOPHAGOGASTRODUODENOSCOPY N/A 02/09/2015   Procedure: ESOPHAGOGASTRODUODENOSCOPY (EGD);  Surgeon: [name], MD;  Location: [facility] ENDOSCOPY;  Service: Endoscopy;  Laterality: N/A;  . HIP SURGERY    . IVC FILTER  INSERTION N/A 02/14/2017   Procedure: IVC Filter Insertion;  Surgeon: Katha Cabal, MD;  Location: [place] CV LAB;  Service: Cardiovascular;  Laterality: N/A;  . IVC FILTER REMOVAL N/A 03/24/2018   Procedure: IVC FILTER REMOVAL;  Surgeon: Katha Cabal, MD;  Location: [place] CV LAB;  Service: Cardiovascular;  Laterality: N/A;  . LIMB SPARING RESECTION HIP W/ SADDLE JOINT REPLACEMENT Bilateral   . THYROID SURGERY    . TOTAL HIP REVISION      Current Medications: Current Meds  Medication Sig  . acetaminophen (TYLENOL) 500 MG tablet Take 1,000 mg by mouth every 6 (six) hours as needed for moderate pain or headache.  . allopurinol (ZYLOPRIM) 100 MG tablet One tablet daily, 90  days  . Biotin 10 MG TABS Take 10 mg by mouth daily.   . dapagliflozin propanediol (FARXIGA) 5 MG TABS tablet Take 10 mg by mouth daily.   Marland Kitchen dicyclomine (BENTYL) 10 MG capsule Take 20 mg by mouth every 6 (six) hours.   Marland Kitchen ELIQUIS 5 MG TABS tablet TAKE ONE TABLET BY MOUTH TWICE DAILY  . gabapentin (NEURONTIN) 300 MG capsule Take 300 mg by mouth 2 (two) times daily.  Marland Kitchen glimepiride (AMARYL) 2 MG tablet Take 2 mg by mouth daily with breakfast.   . levothyroxine (SYNTHROID, LEVOTHROID) 175 MCG tablet Take 175 mcg by mouth daily before breakfast.   . lisinopril (PRINIVIL,ZESTRIL) 40 MG tablet Take 40 mg by mouth daily.  . Melatonin 5 MG CAPS Take by mouth at bedtime.   Marland Kitchen morphine (MS CONTIN) 15 MG 12 hr tablet Take 1 tablet (15 mg total) by mouth every 12 (twelve) hours.  . pantoprazole (PROTONIX) 40 MG tablet Take 40 mg by mouth 2 (two) times daily.   . predniSONE (DELTASONE) 10 MG tablet TAPER AS DIRECTED: 5 5 4 4 3 3 2 2 1 1  AS DIRECTED, ONLY FOR GOUT FLARE DOSING CALENDAR INCLUDED  . RESTASIS 0.05 % ophthalmic emulsion   . rosuvastatin (CRESTOR) 40 MG tablet Take 40 mg by mouth daily.     Allergies:   Patient has no known allergies.   Social History   Socioeconomic History  . Marital status:  Divorced    Spouse name: Not on file  . Number of children: Not on file  . Years of education: Not on file  . Highest education level: Not on file  Occupational History  . Not on file  Social Needs  . Financial resource strain: Not on file  . Food insecurity    Worry: Not on file    Inability: Not on file  . Transportation needs    Medical: Not on file    Non-medical: Not on file  Tobacco Use  . Smoking status: Never Smoker  . Smokeless tobacco: Never Used  Substance and Sexual Activity  . Alcohol use: No    Alcohol/week: 0.0 standard drinks  . Drug use: No  . Sexual activity: Never  Lifestyle  . Physical activity    Days per week: Not on file    Minutes per  session: Not on file  . Stress: Not on file  Relationships  . Social Herbalist on phone: Not on file    Gets together: Not on file    Attends religious service: Not on file    Active member of club or organization: Not on file    Attends meetings of clubs or organizations: Not on file    Relationship status: Not on file  Other Topics Concern  . Not on file  Social History Narrative  . Not on file     Family History:  The patient's family history includes Alcohol abuse in her father; Alzheimer's disease in her mother; Anxiety disorder in her mother; Bladder Cancer in her sister; Breast cancer in her mother; Diabetes in her daughter, father, maternal aunt, and mother; Heart disease in her mother; Hypertension in her mother; Stroke in her mother; Thyroid disease in her brother.  ROS:   Review of Systems  Constitutional: Positive for malaise/fatigue. Negative for chills, diaphoresis, fever and weight loss.  HENT: Negative for congestion.   Eyes: Negative for discharge and redness.  Respiratory: Negative for cough, hemoptysis, sputum production, shortness of breath and wheezing.   Cardiovascular: Positive for chest pain. Negative for palpitations, orthopnea, claudication, leg swelling and PND.   Gastrointestinal: Negative for abdominal pain, blood in stool, heartburn, melena, nausea and vomiting.  Genitourinary: Negative for hematuria.  Musculoskeletal: Positive for back pain, joint pain, myalgias and neck pain. Negative for falls.  Skin: Negative for rash.  Neurological: Positive for weakness. Negative for dizziness, tingling, tremors, sensory change, speech change, focal weakness and loss of consciousness.  Endo/Heme/Allergies: Does not bruise/bleed easily.  Psychiatric/Behavioral: Negative for substance abuse. The patient is nervous/anxious.   All other systems reviewed and are negative.    EKGs/Labs/Other Studies Reviewed:    Studies reviewed were summarized above. The additional studies were reviewed today:  Coronary CTA 07/2019: Coronary calcium score: The patient's coronary artery calcium score is 0, which places the patient in the 0 percentile.  Coronary arteries: Normal coronary origins.  Right dominance.  Right Coronary Artery: Dominant vessel. Gives off R-PDA and R-PLB branches. No significant stenosis.  Left Main Coronary Artery: Normal. Branches into the LAD and LCx arteries in the usual fashion.  Left Anterior Descending Coronary Artery: Moderate sized vessel, does not reach the apex. One small diagonal branch. Minimal 1-24% stenotic (CADRADS1) non-calcified plaque of the proximal LAD just prior to the 1st septal perforator. Branch.  Left Circumflex Artery: Follows the AV groove. Gives off a single larger OM branch which is tortuous. No significant stenosis of either branch.  Aorta: Normal size, 22 mm at the mid ascending aorta (level of the PA bifurcation) measured double oblique. Aortic atherosclerosis. No dissection.  Aortic Valve: Trileaflet.  No calcifications.  Other findings:  Normal pulmonary vein drainage into the left atrium.  Normal left atrial appendage without a thrombus.  Normal size of the pulmonary artery.  No  significant central pulmonary embolus  IMPRESSION: 1. Minimal non-obstructive CAD, CADRADS =1.  2. Coronary calcium score of 0. This was 0 percentile for age and sex matched control.  3. Normal coronary origin with right dominance.  4. Continued risk factor modification recommended. __________  2D Echo 07/2019:  1. Left ventricular ejection fraction, by visual estimation, is 60 to 65%. The left ventricle has normal function. There is no left ventricular hypertrophy.  2. Global right ventricle has normal systolic function.The right ventricular size is normal. No increase in right ventricular  wall thickness.  3. Left atrial size was normal.  4. TR signal is inadequate for assessing pulmonary artery systolic pressure.   EKG:  EKG is ordered today.  The EKG ordered today demonstrates NSR, 65 bpm, normal axis, no acute ST-T changes  Recent Labs: 03/02/2019: ALT 34; Hemoglobin 13.4; Platelets 231 07/22/2019: BUN 15; Creatinine, Ser 0.37; Potassium 3.9; Sodium 140  Recent Lipid Panel No results found for: CHOL, TRIG, HDL, CHOLHDL, VLDL, LDLCALC, LDLDIRECT  PHYSICAL EXAM:    VS:  BP 120/66 (BP Location: Right Arm, Patient Position: Sitting, Cuff Size: Large)   Pulse 65   Temp 97.9 F (36.6 C)   Ht 4\' 8"  (1.422 m)   Wt 180 lb 5 oz (81.8 kg)   LMP 07/04/2015 (Approximate) Comment: >2 years ago  BMI 40.43 kg/m   BMI: Body mass index is 40.43 kg/m.  Physical Exam  Constitutional: She is oriented to person, place, and time. She appears well-developed and well-nourished.  HENT:  Head: Normocephalic and atraumatic.  Eyes: Right eye exhibits no discharge. Left eye exhibits no discharge.  Neck: Normal range of motion. No JVD present.  Cardiovascular: Normal rate, regular rhythm, S1 normal, S2 normal and normal heart sounds. Exam reveals no distant heart sounds, no friction rub, no midsystolic click and no opening snap.  No murmur heard. Pulses:      Radial pulses are 2+ on the  right side and 2+ on the left side.       Posterior tibial pulses are 2+ on the right side and 2+ on the left side.  Pulmonary/Chest: Effort normal and breath sounds normal. No respiratory distress. She has no decreased breath sounds. She has no wheezes. She has no rales. She exhibits no tenderness.  Abdominal: Soft. She exhibits no distension. There is no abdominal tenderness.  Musculoskeletal:        General: No edema.  Neurological: She is alert and oriented to person, place, and time.  Skin: Skin is warm and dry. No cyanosis. Nails show no clubbing.  Psychiatric: She has a normal mood and affect. Her speech is normal and behavior is normal. Judgment and thought content normal.    Wt Readings from Last 3 Encounters:  07/26/19 180 lb 5 oz (81.8 kg)  06/23/19 178 lb 12 oz (81.1 kg)  04/26/19 180 lb (81.6 kg)     ASSESSMENT & PLAN:   1. Atypical chest pain: She continues to note atypical chest pain which dates back to the diagnosis of her PE.  Cardiac work-up including prior nuclear stress test, and most recently coronary CTA and echo are both unrevealing.  No further cardiac work-up is indicated at this time.  Follow-up with PCP if symptoms persist.  2. Leg discomfort: Difficult to assess based on functional status secondary to her fibromyalgia.  She requests further evaluation.  In this setting, we will schedule lower extremity ABIs.  If this is normal recommend she follow-up with PCP.  3. History of DVT/PE: Remains on Eliquis indefinitely.  No symptoms of bleeding.  Most recent hemoglobin stable.  Follow-up with PCP as directed.  4. Fibromyalgia/anxiety: Likely playing a significant role in her presentation.  Follow-up with PCP as directed.  5. HTN: Blood pressure is well controlled at 120/66.  Continue lisinopril 40 mg daily.  6. HLD: Remains on Crestor.  Not currently fasting.  Future order placed for her to come by the [facility] for fasting lipid panel and liver function at her  convenience.  Followed by PCP.  Follow-up as directed.  Disposition: F/u with Dr. Fletcher Anon or an APP in 12 months.   Medication Adjustments/Labs and Tests Ordered: Current medicines are reviewed at length with the patient today.  Concerns regarding medicines are outlined above. Medication changes, Labs and Tests ordered today are summarized above and listed in the Patient Instructions accessible in Encounters.   Signed, Christell Faith, PA-C 07/26/2019 8:16 AM     [address] [place] [address] [place], [place] [address] [phone]

## 2019-07-26 ENCOUNTER — Ambulatory Visit (INDEPENDENT_AMBULATORY_CARE_PROVIDER_SITE_OTHER): Payer: Medicare Other | Admitting: Physician Assistant

## 2019-07-26 ENCOUNTER — Other Ambulatory Visit: Payer: Self-pay

## 2019-07-26 ENCOUNTER — Encounter: Payer: Self-pay | Admitting: Physician Assistant

## 2019-07-26 VITALS — BP 120/66 | HR 65 | Temp 97.9°F | Ht <= 58 in | Wt 180.3 lb

## 2019-07-26 DIAGNOSIS — Z86718 Personal history of other venous thrombosis and embolism: Secondary | ICD-10-CM

## 2019-07-26 DIAGNOSIS — I2699 Other pulmonary embolism without acute cor pulmonale: Secondary | ICD-10-CM

## 2019-07-26 DIAGNOSIS — M79604 Pain in right leg: Secondary | ICD-10-CM

## 2019-07-26 DIAGNOSIS — M79605 Pain in left leg: Secondary | ICD-10-CM

## 2019-07-26 DIAGNOSIS — I10 Essential (primary) hypertension: Secondary | ICD-10-CM

## 2019-07-26 DIAGNOSIS — M797 Fibromyalgia: Secondary | ICD-10-CM

## 2019-07-26 DIAGNOSIS — R0789 Other chest pain: Secondary | ICD-10-CM | POA: Diagnosis not present

## 2019-07-26 DIAGNOSIS — E782 Mixed hyperlipidemia: Secondary | ICD-10-CM

## 2019-07-26 DIAGNOSIS — F419 Anxiety disorder, unspecified: Secondary | ICD-10-CM

## 2019-07-26 NOTE — Patient Instructions (Signed)
Medication Instructions:   1. Your physician recommends that you continue on your current medications as directed. Please refer to the Current Medication list given to you today.  *If you need a refill on your cardiac medications before your next appointment, please call your pharmacy*  Lab Work:  1. Your physician recommends that you return for lab work at the medical mall. (Liver/Lipid) You will need to be fasting.  No appt is needed. Hours are M-F 7AM- 6 PM.  If you have labs (blood work) drawn today and your tests are completely normal, you will receive your results only by: Marland Kitchen MyChart Message (if you have MyChart) OR . A paper copy in the mail If you have any lab test that is abnormal or we need to change your treatment, we will call you to review the results.  Testing/Procedures:  1. Your physician has requested that you have an ankle brachial index (ABI). During this test an ultrasound and blood pressure cuff are used to evaluate the arteries that supply the arms and legs with blood. Allow thirty minutes for this exam. There are no restrictions or special instructions.  2. Your physician has requested that you have a lower or upper extremity arterial duplex. This test is an ultrasound of the arteries in the legs or arms. It looks at arterial blood flow in the legs and arms. Allow one hour for Lower and Upper Arterial scans. There are no restrictions or special instructions   Follow-Up: At [facility], you and your health needs are our priority.  As part of our continuing mission to provide you with exceptional heart care, we have created designated Provider Care Teams.  These Care Teams include your primary Cardiologist (physician) and Advanced Practice Providers (APPs -  Physician Assistants and Nurse Practitioners) who all work together to provide you with the care you need, when you need it.  Your next appointment:   12 month(s)  The format for your next appointment:   In  Person  Provider:    You may see Kathlyn Sacramento, MD or Christell Faith, PA-C

## 2019-07-27 NOTE — Telephone Encounter (Signed)
See results note where Lattie Haw RN called and reviewed with patient.

## 2019-08-10 ENCOUNTER — Other Ambulatory Visit: Payer: Self-pay | Admitting: Physician Assistant

## 2019-08-10 DIAGNOSIS — I739 Peripheral vascular disease, unspecified: Secondary | ICD-10-CM

## 2019-08-25 ENCOUNTER — Other Ambulatory Visit: Payer: Self-pay

## 2019-08-25 ENCOUNTER — Ambulatory Visit: Payer: Medicare Other

## 2019-09-09 ENCOUNTER — Inpatient Hospital Stay: Payer: Medicare Other | Attending: Internal Medicine

## 2019-09-09 ENCOUNTER — Inpatient Hospital Stay: Payer: Medicare Other | Admitting: Internal Medicine

## 2019-09-28 ENCOUNTER — Telehealth: Payer: Self-pay | Admitting: Physician Assistant

## 2019-09-28 NOTE — Telephone Encounter (Signed)
Spoke with the patient and mad her aware of Christell Faith, PA recommendation. Advised her that the studies previous ordered were Arterial to r/o PAD. Patient sts that she is concerned that she is having reoccurring DVTs. She is taking her Eliquis 5mg  bid and sees [facility] Dr. Delana Meyer. She has a 10/28/19 appt scheduled with [facility].  Advised her that her risk of DVT with anticoagulation is low. Recommended that she contact [facility] to update Dr. Delana Meyer on her symptoms and concerns.  Patient became a bit upset. Patient sts that Dr. Moody Bruins referred her to Cardiology. She insist that the message be sent back to [place]. Advised the patient that I will fwd the message as requested.

## 2019-09-28 NOTE — Telephone Encounter (Signed)
We do not treat DVTs.  She was sent to cardiology for evaluation of atypical chest pain and has undergone coronary CTA which showed a calcium score of 0 with no CAD.  If patient is concerned she is having recurrent DVT despite treatment and compliance with Eliquis she should follow-up with PCP, vascular or proceed to the ED.

## 2019-09-28 NOTE — Telephone Encounter (Signed)
Update fwd to the ordering provider Christell Faith, PA to advise.

## 2019-09-28 NOTE — Telephone Encounter (Signed)
Patient had to cancel LEA because of continued gout flare.  She states she will not be able to tolerate this exam due to flares and clotting issues.   Please call to discuss possible alternative.

## 2019-09-28 NOTE — Telephone Encounter (Signed)
Called to give the pt Ryan's response and recommendation. [email or web address].

## 2019-09-28 NOTE — Telephone Encounter (Signed)
We can follow up with her pulses in follow up. She had normal pulses on her last exam. Her symptoms are likely related to her underlying fibromyalgia and can be discussed with her PCP.

## 2019-09-29 ENCOUNTER — Inpatient Hospital Stay: Payer: Medicare Other

## 2019-09-29 ENCOUNTER — Inpatient Hospital Stay: Payer: Medicare Other | Admitting: Internal Medicine

## 2019-10-01 NOTE — Telephone Encounter (Signed)
No answer. Left detailed message with recommendations, ok per DPR, and to call back if any questions. 

## 2019-10-27 ENCOUNTER — Inpatient Hospital Stay: Payer: Medicare Other | Admitting: Internal Medicine

## 2019-10-27 ENCOUNTER — Inpatient Hospital Stay: Payer: Medicare Other

## 2019-10-28 ENCOUNTER — Other Ambulatory Visit: Payer: Self-pay

## 2019-10-28 ENCOUNTER — Ambulatory Visit (INDEPENDENT_AMBULATORY_CARE_PROVIDER_SITE_OTHER): Payer: Medicare Other | Admitting: Vascular Surgery

## 2019-10-28 ENCOUNTER — Encounter (INDEPENDENT_AMBULATORY_CARE_PROVIDER_SITE_OTHER): Payer: Self-pay | Admitting: Vascular Surgery

## 2019-10-28 VITALS — BP 108/69 | HR 69 | Resp 12 | Ht <= 58 in | Wt 195.0 lb

## 2019-10-28 DIAGNOSIS — M79604 Pain in right leg: Secondary | ICD-10-CM

## 2019-10-28 DIAGNOSIS — M51369 Other intervertebral disc degeneration, lumbar region without mention of lumbar back pain or lower extremity pain: Secondary | ICD-10-CM

## 2019-10-28 DIAGNOSIS — M79606 Pain in leg, unspecified: Secondary | ICD-10-CM | POA: Insufficient documentation

## 2019-10-28 DIAGNOSIS — M79605 Pain in left leg: Secondary | ICD-10-CM | POA: Diagnosis not present

## 2019-10-28 DIAGNOSIS — I890 Lymphedema, not elsewhere classified: Secondary | ICD-10-CM | POA: Diagnosis not present

## 2019-10-28 DIAGNOSIS — M5136 Other intervertebral disc degeneration, lumbar region: Secondary | ICD-10-CM

## 2019-10-28 DIAGNOSIS — E119 Type 2 diabetes mellitus without complications: Secondary | ICD-10-CM | POA: Diagnosis not present

## 2019-10-28 DIAGNOSIS — I10 Essential (primary) hypertension: Secondary | ICD-10-CM

## 2019-10-28 NOTE — Progress Notes (Signed)
MRN : [id number]  Katie Baker is a 59 y.o. (1960/10/21) female who presents with chief complaint of  Chief Complaint  Patient presents with  . Follow-up    42mo follow up  .  History of Present Illness:   The patient presents to the office for evaluation of leg pain and swelling.   DVT was identified at [facility] by Duplex ultrasound in June 2018. The initial symptoms were SOB secondary to her PE's associated withpain and swelling in the lower extremity.  The IVC filter was removed 03/24/2018.  Patient states her swelling and leg pain have been increasing.  It is now a problem all the time.  Also having leg cramps.  The patient hasnot been using compression therapy at this point because she is not able to put the stockings on.  She has not been using her Lymph pump twice per day because she feels it makes her gout worse. She is now spending the majority of her time in a wheelchair because of her disability.  She continues to use a hospital bed and elevates her feet at night.  No SOB or pleuritic chest pains. No cough or hemoptysis.  No blood per rectum or blood in any sputum. No excessive bruising per the patient.    Current Meds  Medication Sig  . acetaminophen (TYLENOL) 500 MG tablet Take 1,000 mg by mouth every 6 (six) hours as needed for moderate pain or headache.  . allopurinol (ZYLOPRIM) 100 MG tablet One tablet daily, 90  days  . Biotin 10 MG TABS Take 10 mg by mouth daily.   . cycloSPORINE (RESTASIS) 0.05 % ophthalmic emulsion Apply to eye.  . dapagliflozin propanediol (FARXIGA) 5 MG TABS tablet Take 10 mg by mouth daily.   Marland Kitchen ELIQUIS 5 MG TABS tablet TAKE ONE TABLET BY MOUTH TWICE DAILY  . gabapentin (NEURONTIN) 300 MG capsule Take 300 mg by mouth 2 (two) times daily.  Marland Kitchen glimepiride (AMARYL) 2 MG tablet Take 2 mg by mouth daily with breakfast.   . levothyroxine (SYNTHROID, LEVOTHROID) 175 MCG tablet Take 175 mcg by mouth daily before breakfast.   .  lisinopril (PRINIVIL,ZESTRIL) 40 MG tablet Take 40 mg by mouth daily.  . Melatonin 5 MG CAPS Take by mouth at bedtime.   Marland Kitchen morphine (MS CONTIN) 15 MG 12 hr tablet Take 1 tablet (15 mg total) by mouth every 12 (twelve) hours.  . pantoprazole (PROTONIX) 40 MG tablet Take 40 mg by mouth 2 (two) times daily.   . RESTASIS 0.05 % ophthalmic emulsion   . rosuvastatin (CRESTOR) 40 MG tablet Take 40 mg by mouth daily.   . Semaglutide (OZEMPIC, 0.25 OR 0.5 MG/DOSE, [place]) Inject into the skin as directed.    Past Medical History:  Diagnosis Date  . Deep vein thrombosis (DVT) ([place])   . Diabetes mellitus, type II ([place])   . Dislocation of hip, congenital   . Fibromyalgia   . Gout   . Hearing loss    left ear  . Hyperlipidemia   . Hypertension   . Hypothyroidism   . Obesity   . Prediabetes   . Pulmonary embolism ([place]) 01/2017  . Reflux   . Sleep apnea   . Spinal stenosis   . Tendonitis   . Thyroid cancer [facility])     Past Surgical History:  Procedure Laterality Date  . COLONOSCOPY WITH PROPOFOL N/A 02/09/2015   Procedure: COLONOSCOPY WITH PROPOFOL;  Surgeon: [name], MD;  Location[facility] ENDOSCOPY;  Service: Endoscopy;  Laterality: N/A;  . ESOPHAGOGASTRODUODENOSCOPY N/A 02/09/2015   Procedure: ESOPHAGOGASTRODUODENOSCOPY (EGD);  Surgeon: [name], MD;  Location: [facility] ENDOSCOPY;  Service: Endoscopy;  Laterality: N/A;  . HIP SURGERY    . IVC FILTER INSERTION N/A 02/14/2017   Procedure: IVC Filter Insertion;  Surgeon: Katha Cabal, MD;  Location: [place] CV LAB;  Service: Cardiovascular;  Laterality: N/A;  . IVC FILTER REMOVAL N/A 03/24/2018   Procedure: IVC FILTER REMOVAL;  Surgeon: Katha Cabal, MD;  Location: [place] CV LAB;  Service: Cardiovascular;  Laterality: N/A;  . LIMB SPARING RESECTION HIP W/ SADDLE JOINT REPLACEMENT Bilateral   . THYROID SURGERY    . TOTAL HIP REVISION      Social History Social History   Tobacco Use  . Smoking status:  Never Smoker  . Smokeless tobacco: Never Used  Substance Use Topics  . Alcohol use: No    Alcohol/week: 0.0 standard drinks  . Drug use: No    Family History Family History  Problem Relation Age of Onset  . Heart disease Mother   . Hypertension Mother   . Alzheimer's disease Mother   . Diabetes Mother   . Stroke Mother   . Anxiety disorder Mother   . Breast cancer Mother        at 71 years  . Diabetes Father   . Alcohol abuse Father   . Bladder Cancer Sister   . Thyroid disease Brother   . Diabetes Daughter   . Diabetes Maternal Aunt     No Known Allergies   REVIEW OF SYSTEMS (Negative unless checked)  Constitutional: [] Weight loss  [] Fever  [] Chills Cardiac: [] Chest pain   [] Chest pressure   [] Palpitations   [] Shortness of breath when laying flat   [] Shortness of breath with exertion. Vascular:  [x] Pain in legs with walking   [x] Pain in legs at rest  [] History of DVT   [] Phlebitis   [x] Swelling in legs   [] Varicose veins   [] Non-healing ulcers Pulmonary:   [] Uses home oxygen   [] Productive cough   [] Hemoptysis   [] Wheeze  [] COPD   [] Asthma Neurologic:  [] Dizziness   [] Seizures   [] History of stroke   [] History of TIA  [] Aphasia   [] Vissual changes   [x] Weakness or numbness in arm   [x] Weakness or numbness in leg Musculoskeletal:   [] Joint swelling   [x] Joint pain   [x] Low back pain Hematologic:  [] Easy bruising  [] Easy bleeding   [] Hypercoagulable state   [] Anemic Gastrointestinal:  [] Diarrhea   [] Vomiting  [] Gastroesophageal reflux/heartburn   [] Difficulty swallowing. Genitourinary:  [] Chronic kidney disease   [] Difficult urination  [] Frequent urination   [] Blood in urine Skin:  [] Rashes   [] Ulcers  Psychological:  [] History of anxiety   []  History of major depression.  Physical Examination  Vitals:   10/28/19 1012  BP: 108/69  Pulse: 69  Resp: 12  Weight: 195 lb (88.5 kg)  Height: 4\' 8"  (1.422 m)   Body mass index is 43.72 kg/m. Gen: WD/WN, NAD,seen in a  wheelchair Head: [place]/AT, No temporalis wasting.  Ear/Nose/Throat: Hearing grossly intact, nares w/o erythema or drainage Eyes: PER, EOMI, sclera nonicteric.  Neck: Supple, no large masses.   Pulmonary:  Good air movement, no audible wheezing bilaterally, no use of accessory muscles.  Cardiac: RRR, no JVD Vascular: scattered varicosities present bilaterally.  Mild venous stasis changes to the legs bilaterally.  3-4+ hard/tense nonpitting edema Gastrointestinal: Non-distended. No guarding/no peritoneal signs.  Musculoskeletal: M/S 3/5  throughout.  No deformity or atrophy.  Neurologic: CN 2-12 intact. Symmetrical.  Speech is fluent. Motor exam as listed above. Psychiatric: Judgment intact, Mood & affect appropriate for pt's clinical situation. Dermatologic: mild venous rashes no ulcers noted.  No changes consistent with cellulitis.  CBC Lab Results  Component Value Date   WBC 7.4 03/02/2019   HGB 13.4 03/02/2019   HCT 41.5 03/02/2019   MCV 86.8 03/02/2019   PLT 231 03/02/2019    BMET    Component Value Date/Time   NA 140 07/22/2019 1506   NA 141 10/01/2011 0721   K 3.9 07/22/2019 1506   K 3.6 10/01/2011 0721   CL 105 07/22/2019 1506   CL 104 10/01/2011 0721   CO2 25 07/22/2019 1506   CO2 28 10/01/2011 0721   GLUCOSE 93 07/22/2019 1506   GLUCOSE 111 (H) 10/01/2011 0721   BUN 15 07/22/2019 1506   BUN 10 10/01/2011 0721   CREATININE 0.37 (L) 07/22/2019 1506   CREATININE 0.51 (L) 10/01/2011 0721   CALCIUM 8.9 07/22/2019 1506   CALCIUM 9.1 10/01/2011 0721   GFRNONAA >60 07/22/2019 1506   GFRNONAA >60 10/01/2011 0721   GFRAA >60 07/22/2019 1506   GFRAA >60 10/01/2011 0721   CrCl cannot be calculated (Patient's most recent lab result is older than the maximum 21 days allowed.).  COAG Lab Results  Component Value Date   INR 0.96 02/12/2017    Radiology No results found.   Assessment/Plan 1. Pain in both lower extremities  No surgery or intervention at this point in  time.    Today by her history she seems worse but since last visit she has not been using compression daily and she has not been using the lymph pump as much  I have reviewed my discussion with the patient regarding lymphedema and why it  causes symptoms.  Patient should begin wearing graduated compression wraps  (20-30 mmHg) on a daily basis a prescription was given. The patient is reminded to put the wraps on first thing in the morning and removing them in the evening. The patient is instructed specifically not to sleep in the stockings.   In addition, behavioral modification throughout the day will be continued.  This will include frequent elevation (such as her hospital bed), use of over the counter pain medications as needed and exercise such as walking.  The patient is encouraged to be more  aggressive with the use of the  lymph pump.  This will continue to improve the edema control and prevent sequela such as ulcers and infections.   Given her increased leg cramps we will try Magnesium supplementation  Given her increased pain I will obtain an duplex ultrasound of the veins.  - VAS [place] LOWER EXTREMITY VENOUS (DVT); Future  2. Lymphedema See #1  3. Degeneration of intervertebral disc of lumbar region Likely the cause of her leg pain   Continue NSAID/ narcotic medications as already ordered, these medications have been reviewed and there are no changes at this time.  Continued activity and therapy was stressed.  Further plan per pain management and primary service  4. Type 2 diabetes mellitus without complication, without long-term current use of insulin (HCC) Continue hypoglycemic medications as already ordered, these medications have been reviewed and there are no changes at this time.  Hgb A1C to be monitored as already arranged by primary service   5. Essential hypertension Continue antihypertensive medications as already ordered, these medications have been reviewed and  there  are no changes at this time.     Hortencia Pilar, MD  10/28/2019 10:29 AM

## 2019-11-17 ENCOUNTER — Ambulatory Visit (INDEPENDENT_AMBULATORY_CARE_PROVIDER_SITE_OTHER): Payer: Medicare Other | Admitting: Nurse Practitioner

## 2019-11-17 ENCOUNTER — Other Ambulatory Visit: Payer: Self-pay

## 2019-11-17 ENCOUNTER — Ambulatory Visit (INDEPENDENT_AMBULATORY_CARE_PROVIDER_SITE_OTHER): Payer: Medicare Other

## 2019-11-17 ENCOUNTER — Encounter (INDEPENDENT_AMBULATORY_CARE_PROVIDER_SITE_OTHER): Payer: Self-pay | Admitting: Nurse Practitioner

## 2019-11-17 VITALS — BP 120/74 | HR 78 | Resp 16

## 2019-11-17 DIAGNOSIS — M79605 Pain in left leg: Secondary | ICD-10-CM | POA: Diagnosis not present

## 2019-11-17 DIAGNOSIS — I10 Essential (primary) hypertension: Secondary | ICD-10-CM

## 2019-11-17 DIAGNOSIS — M79604 Pain in right leg: Secondary | ICD-10-CM | POA: Diagnosis not present

## 2019-11-17 DIAGNOSIS — I890 Lymphedema, not elsewhere classified: Secondary | ICD-10-CM

## 2019-11-22 ENCOUNTER — Encounter (INDEPENDENT_AMBULATORY_CARE_PROVIDER_SITE_OTHER): Payer: Self-pay | Admitting: Nurse Practitioner

## 2019-11-22 NOTE — Progress Notes (Signed)
SUBJECTIVE:  Patient ID: Katie Baker, female    DOB: July 13, 1961, 59 y.o.   MRN: [id number] No chief complaint on file.   HPI  Katie Baker is a 59 y.o. female presents today for follow-up studies related to lower extremity leg pain and swelling.  The patient has had a previous history of DVT with associated pulmonary embolism.  The patient did have an IVC filter placed however that was removed on 03/24/2018.  Since that time the patient states that her legs have been swelling and the pain has been increasing.  This is not a constant problem.  She also endorses having lower extremity cramping.  The patient also has a lymphedema pump, however she has not been utilizing it due to leg pain.  The patient states that the lymphedema pump makes her gout and leg pain worsened and she is not able to tolerate the pressure.  The patient does not utilize medical grade 1 compression stockings due to the inability to place them on.  The patient does have frequent elevation as she can use a hospital bed for elevation.  Currently the patient does have a gout flare which just recently began.  She denies any chest pain or shortness of breath.  Today the patient underwent noninvasive studies which revealed no evidence of DVT, superficial venous thrombosis or chronic venous insufficiency in the bilateral lower extremities.  Past Medical History:  Diagnosis Date  . Deep vein thrombosis (DVT) ([place])   . Diabetes mellitus, type II ([place])   . Dislocation of hip, congenital   . Fibromyalgia   . Gout   . Hearing loss    left ear  . Hyperlipidemia   . Hypertension   . Hypothyroidism   . Obesity   . Prediabetes   . Pulmonary embolism ([place]) 01/2017  . Reflux   . Sleep apnea   . Spinal stenosis   . Tendonitis   . Thyroid cancer [facility])     Past Surgical History:  Procedure Laterality Date  . COLONOSCOPY WITH PROPOFOL N/A 02/09/2015   Procedure: COLONOSCOPY WITH PROPOFOL;  Surgeon: [name], MD;   Location: [facility] ENDOSCOPY;  Service: Endoscopy;  Laterality: N/A;  . ESOPHAGOGASTRODUODENOSCOPY N/A 02/09/2015   Procedure: ESOPHAGOGASTRODUODENOSCOPY (EGD);  Surgeon: [name], MD;  Location: [facility] ENDOSCOPY;  Service: Endoscopy;  Laterality: N/A;  . HIP SURGERY    . IVC FILTER INSERTION N/A 02/14/2017   Procedure: IVC Filter Insertion;  Surgeon: Katha Cabal, MD;  Location: [place] CV LAB;  Service: Cardiovascular;  Laterality: N/A;  . IVC FILTER REMOVAL N/A 03/24/2018   Procedure: IVC FILTER REMOVAL;  Surgeon: Katha Cabal, MD;  Location: [place] CV LAB;  Service: Cardiovascular;  Laterality: N/A;  . LIMB SPARING RESECTION HIP W/ SADDLE JOINT REPLACEMENT Bilateral   . THYROID SURGERY    . TOTAL HIP REVISION      Social History   Socioeconomic History  . Marital status: Divorced    Spouse name: Not on file  . Number of children: Not on file  . Years of education: Not on file  . Highest education level: Not on file  Occupational History  . Not on file  Tobacco Use  . Smoking status: Never Smoker  . Smokeless tobacco: Never Used  Substance and Sexual Activity  . Alcohol use: No    Alcohol/week: 0.0 standard drinks  . Drug use: No  . Sexual activity: Never  Other Topics Concern  . Not on file  Social History Narrative  . Not on file   Social Determinants of Health   Financial Resource Strain:   . Difficulty of Paying Living Expenses:   Food Insecurity:   . Worried About Charity fundraiser in the Last Year:   . Arboriculturist in the Last Year:   Transportation Needs:   . Film/video editor (Medical):   Marland Kitchen Lack of Transportation (Non-Medical):   Physical Activity:   . Days of Exercise per Week:   . Minutes of Exercise per Session:   Stress:   . Feeling of Stress :   Social Connections:   . Frequency of Communication with Friends and Family:   . Frequency of Social Gatherings with Friends and Family:   . Attends Religious Services:     . Active Member of Clubs or Organizations:   . Attends Archivist Meetings:   Marland Kitchen Marital Status:   Intimate Partner Violence:   . Fear of Current or Ex-Partner:   . Emotionally Abused:   Marland Kitchen Physically Abused:   . Sexually Abused:     Family History  Problem Relation Age of Onset  . Heart disease Mother   . Hypertension Mother   . Alzheimer's disease Mother   . Diabetes Mother   . Stroke Mother   . Anxiety disorder Mother   . Breast cancer Mother        at 66 years  . Diabetes Father   . Alcohol abuse Father   . Bladder Cancer Sister   . Thyroid disease Brother   . Diabetes Daughter   . Diabetes Maternal Aunt     No Known Allergies   Review of Systems   Review of Systems: Negative Unless Checked Constitutional: [] Weight loss  [] Fever  [] Chills Cardiac: [] Chest pain   []  Atrial Fibrillation  [] Palpitations   [] Shortness of breath when laying flat   [] Shortness of breath with exertion. [] Shortness of breath at rest Vascular:  [] Pain in legs with walking   [] Pain in legs with standing [] Pain in legs when laying flat   [] Claudication    [] Pain in feet when laying flat    [] History of DVT   [] Phlebitis   [x] Swelling in legs   [] Varicose veins   [] Non-healing ulcers Pulmonary:   [] Uses home oxygen   [] Productive cough   [] Hemoptysis   [] Wheeze  [] COPD   [] Asthma Neurologic:  [] Dizziness   [] Seizures  [] Blackouts [] History of stroke   [] History of TIA  [] Aphasia   [] Temporary Blindness   [] Weakness or numbness in arm   [x] Weakness or numbness in leg Musculoskeletal:   [] Joint swelling   [] Joint pain   [] Low back pain  []  History of Knee Replacement [x] Arthritis [] back Surgeries  []  Spinal Stenosis    Hematologic:  [] Easy bruising  [] Easy bleeding   [] Hypercoagulable state   [] Anemic Gastrointestinal:  [] Diarrhea   [] Vomiting  [] Gastroesophageal reflux/heartburn   [] Difficulty swallowing. [] Abdominal pain Genitourinary:  [] Chronic kidney disease   [] Difficult urination   [] Anuric   [] Blood in urine [] Frequent urination  [] Burning with urination   [] Hematuria Skin:  [] Rashes   [] Ulcers [] Wounds Psychological:  [] History of anxiety   []  History of major depression  []  Memory Difficulties      OBJECTIVE:   Physical Exam  BP 120/74 (BP Location: Right Arm)   Pulse 78   Resp 16   LMP 07/04/2015 (Approximate) Comment: >2 years ago  Gen: WD/WN, NAD Head: [place]/AT, No temporalis wasting.  Ear/Nose/Throat:  Hearing grossly intact, nares w/o erythema or drainage Eyes: PER, EOMI, sclera nonicteric.  Neck: Supple, no masses.  No JVD.  Pulmonary:  Good air movement, no use of accessory muscles.  Cardiac: RRR Vascular:  2+ edema bilaterally Vessel Right Left  Dorsalis Pedis Palpable Palpable  Posterior Tibial Palpable Palpable   Gastrointestinal: soft, non-distended. No guarding/no peritoneal signs.  Musculoskeletal: Uses wheelchair for ambulation.  No deformity or atrophy.  Neurologic: Pain and light touch intact in extremities.  Symmetrical.  Speech is fluent. Motor exam as listed above. Psychiatric: Judgment intact, Mood & affect appropriate for pt's clinical situation. Dermatologic: No Venous rashes. No Ulcers Noted.  No changes consistent with cellulitis. Lymph : No Cervical lymphadenopathy, no lichenification or skin changes of chronic lymphedema.       ASSESSMENT AND PLAN:  1. Lymphedema Had a extended discussion with the patient and family regarding lymphedema and treatment.  Lymphedema is a chronic disease which requires manual control which includes medical grade 1 compression stockings, elevation use of the lymph pump as well as exercise.  The patient has a number of orthopedic conditions which makes it difficult for her to exercise as well as placed medical grade 1 compression stockings.  The patient's issues with gout also make it difficult for her to utilize her lymphedema pump.  Due to the fact that the patient is unable to use compression this  will be a very difficult condition to help.  After discussing with the patient she will return to our office in about a week and have to have bilateral [place] wraps placed following her most recent gout flare.  We will see if she is able to tolerate this and how this helps her lower extremity edema and pain.  2. Pain in both lower extremities The patient has a multitude of issues including some orthopedic issues in addition to fibromyalgia that are likely contributing to her lower extremity pain.  The patient's edema is also likely contributing to her pain.  We will try to address her edema with the plan as listed above.  Otherwise the patient should continue work-up with primary care physician.  3. Essential hypertension Good blood pressure control today.  Patient on appropriate medications.  No changes needed.   Current Outpatient Medications on File Prior to Visit  Medication Sig Dispense Refill  . acetaminophen (TYLENOL) 500 MG tablet Take 1,000 mg by mouth every 6 (six) hours as needed for moderate pain or headache.    . allopurinol (ZYLOPRIM) 100 MG tablet One tablet daily, 90  days    . Biotin 10 MG TABS Take 10 mg by mouth daily.     . cycloSPORINE (RESTASIS) 0.05 % ophthalmic emulsion Apply to eye.    . dapagliflozin propanediol (FARXIGA) 5 MG TABS tablet Take 10 mg by mouth daily.     Marland Kitchen dicyclomine (BENTYL) 10 MG capsule Take 20 mg by mouth every 6 (six) hours.     Marland Kitchen ELIQUIS 5 MG TABS tablet TAKE ONE TABLET BY MOUTH TWICE DAILY 60 tablet 5  . gabapentin (NEURONTIN) 300 MG capsule Take 300 mg by mouth 2 (two) times daily.    Marland Kitchen glimepiride (AMARYL) 2 MG tablet Take 2 mg by mouth daily with breakfast.     . levothyroxine (SYNTHROID, LEVOTHROID) 175 MCG tablet Take 175 mcg by mouth daily before breakfast.   1  . lisinopril (PRINIVIL,ZESTRIL) 40 MG tablet Take 40 mg by mouth daily.    . Melatonin 5 MG CAPS Take by mouth  at bedtime.     Marland Kitchen morphine (MS CONTIN) 15 MG 12 hr tablet Take 1 tablet  (15 mg total) by mouth every 12 (twelve) hours. 30 tablet 0  . pantoprazole (PROTONIX) 40 MG tablet Take 40 mg by mouth 2 (two) times daily.   5  . predniSONE (DELTASONE) 10 MG tablet TAPER AS DIRECTED: 5 5 4 4 3 3 2 2 1 1  AS DIRECTED, ONLY FOR GOUT FLARE DOSING CALENDAR INCLUDED    . RESTASIS 0.05 % ophthalmic emulsion     . rosuvastatin (CRESTOR) 40 MG tablet Take 40 mg by mouth daily.     . Semaglutide (OZEMPIC, 0.25 OR 0.5 MG/DOSE, [place]) Inject into the skin as directed.     No current facility-administered medications on file prior to visit.    There are no Patient Instructions on file for this visit. No follow-ups on file.   Kris Hartmann, NP  This note was completed with Sales executive.  Any errors are purely unintentional.

## 2019-11-23 ENCOUNTER — Encounter: Payer: Self-pay | Admitting: Internal Medicine

## 2019-11-23 ENCOUNTER — Other Ambulatory Visit: Payer: Self-pay

## 2019-11-23 NOTE — Progress Notes (Signed)
Patient pre screened for office appointment, no questions or concerns today. Patient reminded of upcoming appointment time and date. 

## 2019-11-24 ENCOUNTER — Inpatient Hospital Stay: Payer: Medicare Other

## 2019-11-24 ENCOUNTER — Inpatient Hospital Stay: Payer: Medicare Other | Attending: Internal Medicine | Admitting: Internal Medicine

## 2019-11-24 ENCOUNTER — Other Ambulatory Visit: Payer: Self-pay

## 2019-11-24 DIAGNOSIS — E039 Hypothyroidism, unspecified: Secondary | ICD-10-CM | POA: Insufficient documentation

## 2019-11-24 DIAGNOSIS — Z803 Family history of malignant neoplasm of breast: Secondary | ICD-10-CM | POA: Diagnosis not present

## 2019-11-24 DIAGNOSIS — M797 Fibromyalgia: Secondary | ICD-10-CM | POA: Insufficient documentation

## 2019-11-24 DIAGNOSIS — Z7984 Long term (current) use of oral hypoglycemic drugs: Secondary | ICD-10-CM | POA: Insufficient documentation

## 2019-11-24 DIAGNOSIS — Z86711 Personal history of pulmonary embolism: Secondary | ICD-10-CM

## 2019-11-24 DIAGNOSIS — I10 Essential (primary) hypertension: Secondary | ICD-10-CM | POA: Diagnosis not present

## 2019-11-24 DIAGNOSIS — I2699 Other pulmonary embolism without acute cor pulmonale: Secondary | ICD-10-CM

## 2019-11-24 DIAGNOSIS — Z85850 Personal history of malignant neoplasm of thyroid: Secondary | ICD-10-CM | POA: Insufficient documentation

## 2019-11-24 DIAGNOSIS — K219 Gastro-esophageal reflux disease without esophagitis: Secondary | ICD-10-CM | POA: Insufficient documentation

## 2019-11-24 DIAGNOSIS — E669 Obesity, unspecified: Secondary | ICD-10-CM | POA: Insufficient documentation

## 2019-11-24 DIAGNOSIS — G4730 Sleep apnea, unspecified: Secondary | ICD-10-CM | POA: Insufficient documentation

## 2019-11-24 DIAGNOSIS — E785 Hyperlipidemia, unspecified: Secondary | ICD-10-CM | POA: Diagnosis not present

## 2019-11-24 DIAGNOSIS — I87001 Postthrombotic syndrome without complications of right lower extremity: Secondary | ICD-10-CM | POA: Insufficient documentation

## 2019-11-24 DIAGNOSIS — Z79899 Other long term (current) drug therapy: Secondary | ICD-10-CM | POA: Diagnosis not present

## 2019-11-24 DIAGNOSIS — E119 Type 2 diabetes mellitus without complications: Secondary | ICD-10-CM | POA: Insufficient documentation

## 2019-11-24 DIAGNOSIS — Z86718 Personal history of other venous thrombosis and embolism: Secondary | ICD-10-CM | POA: Insufficient documentation

## 2019-11-24 DIAGNOSIS — M109 Gout, unspecified: Secondary | ICD-10-CM | POA: Diagnosis not present

## 2019-11-24 LAB — CBC WITH DIFFERENTIAL/PLATELET
Abs Immature Granulocytes: 0.06 10*3/uL (ref 0.00–0.07)
Basophils Absolute: 0.1 10*3/uL (ref 0.0–0.1)
Basophils Relative: 1 %
Eosinophils Absolute: 0.2 10*3/uL (ref 0.0–0.5)
Eosinophils Relative: 2 %
HCT: 41 % (ref 36.0–46.0)
Hemoglobin: 13.3 g/dL (ref 12.0–15.0)
Immature Granulocytes: 1 %
Lymphocytes Relative: 21 %
Lymphs Abs: 2.5 10*3/uL (ref 0.7–4.0)
MCH: 28.6 pg (ref 26.0–34.0)
MCHC: 32.4 g/dL (ref 30.0–36.0)
MCV: 88.2 fL (ref 80.0–100.0)
Monocytes Absolute: 0.7 10*3/uL (ref 0.1–1.0)
Monocytes Relative: 6 %
Neutro Abs: 8.1 10*3/uL — ABNORMAL HIGH (ref 1.7–7.7)
Neutrophils Relative %: 69 %
Platelets: 236 10*3/uL (ref 150–400)
RBC: 4.65 MIL/uL (ref 3.87–5.11)
RDW: 13.6 % (ref 11.5–15.5)
WBC: 11.5 10*3/uL — ABNORMAL HIGH (ref 4.0–10.5)
nRBC: 0 % (ref 0.0–0.2)

## 2019-11-24 LAB — COMPREHENSIVE METABOLIC PANEL
ALT: 25 U/L (ref 0–44)
AST: 18 U/L (ref 15–41)
Albumin: 3.8 g/dL (ref 3.5–5.0)
Alkaline Phosphatase: 61 U/L (ref 38–126)
Anion gap: 10 (ref 5–15)
BUN: 18 mg/dL (ref 6–20)
CO2: 24 mmol/L (ref 22–32)
Calcium: 8.6 mg/dL — ABNORMAL LOW (ref 8.9–10.3)
Chloride: 104 mmol/L (ref 98–111)
Creatinine, Ser: 0.56 mg/dL (ref 0.44–1.00)
GFR calc Af Amer: >60 mL/min (ref >60)
GFR calc non Af Amer: >60 mL/min (ref >60)
Glucose, Bld: 228 mg/dL — ABNORMAL HIGH (ref 70–99)
Potassium: 3.6 mmol/L (ref 3.5–5.1)
Sodium: 138 mmol/L (ref 135–145)
Total Bilirubin: 0.4 mg/dL (ref 0.3–1.2)
Total Protein: 6.9 g/dL (ref 6.5–8.1)

## 2019-11-24 NOTE — Assessment & Plan Note (Addendum)
#   BIL PE-moderate to large clot burden without right heart strain. On Eliquis tolerating it well.  Stable continue indefinite anticoagulation.   #  Right lower extremity postphlebitic syndrome-[Dr.Schneir]- Worse; stable continue using her compression stockings; lymphedema machine/management as per vascular.  # Acute Gout [Dr.Bock; [facility]]-on steroids as per rheumatology.  #History of thyroid cancer- stable.   On Synthroid follows up with endocrinology.; Dr.Solum.   # DISPOSITION:  # follow up in 6 months- MD [patient preference]-cbc/cmp-Dr.B

## 2019-11-24 NOTE — Progress Notes (Signed)
[place] NOTE  Patient Care Team: Perrin Maltese, MD as PCP - General (Internal Medicine) Wellington Hampshire, MD as PCP - Cardiology (Cardiology) Perrin Maltese, MD as Referring Physician (Internal Medicine) Robert Bellow, MD (General Surgery)  CHIEF COMPLAINTS/PURPOSE OF CONSULTATION:  Bilateral PE & Right LE DVT  # June MID 2018 Bil PE mod-large/Right LE DVT- [immobility sec to chronic arthritis] s/p IVC filter [Dr.Schneir] on Eliquis. Sep 2019-  IVC filter explantation.  # DEC 2011- Thyroid cancer s/p sub total thyroidectomy & DM-2 [Dr.Solum; s/p  RAI; [facility] ].   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 OSA- CPAP; Gout of R knee- Nov 2019.   Oncology History   No history exists.     HISTORY OF PRESENTING ILLNESS:  Katie Baker [age] y.o.  female history of bilateral PE and right lower extremity DVT-currently on Eliquis is here for follow-up.  Patient continues to have bilateral lower extremity swelling for which she was recently evaluated by vascular.  States that she had a recent ultrasound at the office.  Patient also is on prednisone as per rheumatology for her gout "multiple joints".  Her pain is not completely resolved.  Patient is concerned about worsening swelling the legs.  She is not able to do compression stockings with Ace wrap because of her gouty pain.  She continues to be compliant with Eliquis.  No recent admission to hospital.  Review of Systems  Constitutional: Positive for malaise/fatigue. Negative for chills, diaphoresis, fever and weight loss.  HENT: Negative for nosebleeds and sore throat.   Eyes: Negative for double vision.  Respiratory: Negative for cough, hemoptysis, sputum production and wheezing.   Cardiovascular: Positive for leg swelling (Chronic right lower extremity swelling.). Negative for chest pain, palpitations and orthopnea.   Gastrointestinal: Negative for abdominal pain, blood in stool, constipation, diarrhea, heartburn, melena, nausea and vomiting.  Genitourinary: Negative for dysuria, frequency and urgency.  Musculoskeletal: Positive for back pain and joint pain.  Skin: Negative.  Negative for itching and rash.  Neurological: Negative for dizziness, tingling, focal weakness, weakness and headaches.  Endo/Heme/Allergies: Does not bruise/bleed easily.  Psychiatric/Behavioral: Negative for depression. The patient is not nervous/anxious and does not have insomnia.      MEDICAL HISTORY:  Past Medical History:  Diagnosis Date  . Deep vein thrombosis (DVT) ([place])   . Diabetes mellitus, type II ([place])   . Dislocation of hip, congenital   . Fibromyalgia   . Gout   . Hearing loss    left ear  . Hyperlipidemia   . Hypertension   . Hypothyroidism   . Obesity   . Prediabetes   . Pulmonary embolism ([place]) 01/2017  . Reflux   . Sleep apnea   . Spinal stenosis   . Tendonitis   . Thyroid cancer [facility])     SURGICAL HISTORY: Past Surgical History:  Procedure Laterality Date  . COLONOSCOPY WITH PROPOFOL N/A 02/09/2015   Procedure: COLONOSCOPY WITH PROPOFOL;  Surgeon: [name], MD;  Location: [facility] ENDOSCOPY;  Service: Endoscopy;  Laterality: N/A;  . ESOPHAGOGASTRODUODENOSCOPY N/A 02/09/2015   Procedure: ESOPHAGOGASTRODUODENOSCOPY (EGD);  Surgeon: [name], MD;  Location: [facility] ENDOSCOPY;  Service: Endoscopy;  Laterality: N/A;  . HIP SURGERY    . IVC FILTER INSERTION N/A 02/14/2017   Procedure: IVC Filter Insertion;  Surgeon: Katha Cabal, MD;  Location: [place] CV LAB;  Service: Cardiovascular;  Laterality: N/A;  .  IVC FILTER REMOVAL N/A 03/24/2018   Procedure: IVC FILTER REMOVAL;  Surgeon: Katha Cabal, MD;  Location: [place] CV LAB;  Service: Cardiovascular;  Laterality: N/A;  . LIMB SPARING RESECTION HIP W/ SADDLE JOINT REPLACEMENT Bilateral   . THYROID SURGERY    .  TOTAL HIP REVISION      SOCIAL HISTORY: Social History   Socioeconomic History  . Marital status: Divorced    Spouse name: Not on file  . Number of children: Not on file  . Years of education: Not on file  . Highest education level: Not on file  Occupational History  . Not on file  Tobacco Use  . Smoking status: Never Smoker  . Smokeless tobacco: Never Used  Substance and Sexual Activity  . Alcohol use: No    Alcohol/week: 0.0 standard drinks  . Drug use: No  . Sexual activity: Never  Other Topics Concern  . Not on file  Social History Narrative  . Not on file   Social Determinants of Health   Financial Resource Strain:   . Difficulty of Paying Living Expenses:   Food Insecurity:   . Worried About Charity fundraiser in the Last Year:   . Arboriculturist in the Last Year:   Transportation Needs:   . Film/video editor (Medical):   Marland Kitchen Lack of Transportation (Non-Medical):   Physical Activity:   . Days of Exercise per Week:   . Minutes of Exercise per Session:   Stress:   . Feeling of Stress :   Social Connections:   . Frequency of Communication with Friends and Family:   . Frequency of Social Gatherings with Friends and Family:   . Attends Religious Services:   . Active Member of Clubs or Organizations:   . Attends Archivist Meetings:   Marland Kitchen Marital Status:   Intimate Partner Violence:   . Fear of Current or Ex-Partner:   . Emotionally Abused:   Marland Kitchen Physically Abused:   . Sexually Abused:     FAMILY HISTORY: Family History  Problem Relation Age of Onset  . Heart disease Mother   . Hypertension Mother   . Alzheimer's disease Mother   . Diabetes Mother   . Stroke Mother   . Anxiety disorder Mother   . Breast cancer Mother        at 69 years  . Diabetes Father   . Alcohol abuse Father   . Bladder Cancer Sister   . Thyroid disease Brother   . Diabetes Daughter   . Diabetes Maternal Aunt     ALLERGIES:  has No Known  Allergies.  MEDICATIONS:  Current Outpatient Medications  Medication Sig Dispense Refill  . acetaminophen (TYLENOL) 500 MG tablet Take 1,000 mg by mouth every 6 (six) hours as needed for moderate pain or headache.    . allopurinol (ZYLOPRIM) 100 MG tablet One tablet daily, 90  days    . Biotin 10 MG TABS Take 10 mg by mouth daily.     . cycloSPORINE (RESTASIS) 0.05 % ophthalmic emulsion Apply to eye.    . dapagliflozin propanediol (FARXIGA) 5 MG TABS tablet Take 10 mg by mouth daily.     Marland Kitchen dicyclomine (BENTYL) 10 MG capsule Take 20 mg by mouth every 6 (six) hours.     Marland Kitchen ELIQUIS 5 MG TABS tablet TAKE ONE TABLET BY MOUTH TWICE DAILY 60 tablet 5  . gabapentin (NEURONTIN) 300 MG capsule Take 300 mg by mouth 2 (two)  times daily.    Marland Kitchen glimepiride (AMARYL) 2 MG tablet Take 2 mg by mouth daily with breakfast.     . levothyroxine (SYNTHROID, LEVOTHROID) 175 MCG tablet Take 175 mcg by mouth daily before breakfast.   1  . lisinopril (PRINIVIL,ZESTRIL) 40 MG tablet Take 40 mg by mouth daily.    . Melatonin 5 MG CAPS Take by mouth at bedtime.     Marland Kitchen morphine (MS CONTIN) 15 MG 12 hr tablet Take 1 tablet (15 mg total) by mouth every 12 (twelve) hours. 30 tablet 0  . pantoprazole (PROTONIX) 40 MG tablet Take 40 mg by mouth 2 (two) times daily.   5  . predniSONE (DELTASONE) 10 MG tablet TAPER AS DIRECTED: 5 5 4 4 3 3 2 2 1 1  AS DIRECTED, ONLY FOR GOUT FLARE DOSING CALENDAR INCLUDED    . RESTASIS 0.05 % ophthalmic emulsion     . rosuvastatin (CRESTOR) 40 MG tablet Take 40 mg by mouth daily.     . Semaglutide (OZEMPIC, 0.25 OR 0.5 MG/DOSE, [place]) Inject into the skin as directed.     No current facility-administered medications for this visit.      Marland Kitchen  PHYSICAL EXAMINATION:   Vitals:   11/24/19 1030  BP: (!) 147/74  Pulse: 71  Resp: 20  Temp: (!) 96 F (35.6 C)  SpO2: 100%   There were no vitals filed for this visit.  Physical Exam  Constitutional: She is oriented to person, place, and time and  well-developed, well-nourished, and in no distress.  Frail-appearing female patient.  She is walks with a cane/chair.  She is accompanied by family.  HENT:  Head: Normocephalic and atraumatic.  Mouth/Throat: Oropharynx is clear and moist. No oropharyngeal exudate.  Eyes: Pupils are equal, round, and reactive to light.  Cardiovascular: Normal rate and regular rhythm.  Pulmonary/Chest: No respiratory distress. She has no wheezes.  Decreased air entry bilaterally.  No wheeze or crackles  Abdominal: Soft. Bowel sounds are normal. She exhibits no distension and no mass. There is no abdominal tenderness. There is no rebound and no guarding.  Musculoskeletal:        General: No tenderness or edema. Normal range of motion.     Cervical back: Normal range of motion and neck supple.     Comments: Mild swelling of the right lower extremity compared to left.  No tenderness.  Pulses intact.  Neurological: She is alert and oriented to person, place, and time.  Skin: Skin is warm.  Psychiatric: Affect normal.     LABORATORY DATA:  Baker have reviewed the data as listed Lab Results  Component Value Date   WBC 11.5 (H) 11/24/2019   HGB 13.3 11/24/2019   HCT 41.0 11/24/2019   MCV 88.2 11/24/2019   PLT 236 11/24/2019   Recent Labs    03/02/19 1125 03/02/19 1125 07/14/19 0937 07/22/19 1506 11/24/19 1001  NA 141  --   --  140 138  K 3.6  --   --  3.9 3.6  CL 108  --   --  105 104  CO2 23  --   --  25 24  GLUCOSE 182*  --   --  93 228*  BUN 12  --   --  15 18  CREATININE 0.56   < > 0.50 0.37* 0.56  CALCIUM 9.0  --   --  8.9 8.6*  GFRNONAA >60  --   --  >60 >60  GFRAA >60  --   --  >  60 >60  PROT 7.7  --   --   --  6.9  ALBUMIN 4.2  --   --   --  3.8  AST 36  --   --   --  18  ALT 34  --   --   --  25  ALKPHOS 67  --   --   --  61  BILITOT 0.5  --   --   --  0.4   < > = values in this interval not displayed.    RADIOGRAPHIC STUDIES: Baker have personally reviewed the radiological images as  listed and agreed with the findings in the report. VAS [place] LOWER EXTREMITY VENOUS REFLUX  Result Date: 11/22/2019  Lower Venous Reflux Study Performing Technologist: Almira Coaster RVS  Examination Guidelines: A complete evaluation includes B-mode imaging, spectral Doppler, color Doppler, and power Doppler as needed of all accessible portions of each vessel. Bilateral testing is considered an integral part of a complete examination. Limited examinations for reoccurring indications may be performed as noted. The reflux portion of the exam is performed with the patient in reverse Trendelenburg. Significant venous reflux is defined as >500 ms in the superficial venous system, and >1 second in the deep venous system.  Venous Reflux Times +--------------+---------+------+-----------+------------+--------+ RIGHT         Reflux NoRefluxReflux TimeDiameter cmsComments                         Yes                                  +--------------+---------+------+-----------+------------+--------+ CFV           no                                             +--------------+---------+------+-----------+------------+--------+ FV prox       no                                             +--------------+---------+------+-----------+------------+--------+ FV mid        no                                             +--------------+---------+------+-----------+------------+--------+ FV dist       no                                             +--------------+---------+------+-----------+------------+--------+ Popliteal     no                                             +--------------+---------+------+-----------+------------+--------+ GSV at SFJ    no                            .55              +--------------+---------+------+-----------+------------+--------+  GSV prox thighno                            .48               +--------------+---------+------+-----------+------------+--------+ GSV mid thigh no                            .67              +--------------+---------+------+-----------+------------+--------+ GSV dist thighno                            .52              +--------------+---------+------+-----------+------------+--------+ GSV at knee   no                            .54              +--------------+---------+------+-----------+------------+--------+ GSV prox calf no                            .39              +--------------+---------+------+-----------+------------+--------+ SSV Pop Fossa no                            .21              +--------------+---------+------+-----------+------------+--------+  +--------------+---------+------+-----------+------------+--------+ LEFT          Reflux NoRefluxReflux TimeDiameter cmsComments                         Yes                                  +--------------+---------+------+-----------+------------+--------+ CFV           no                                             +--------------+---------+------+-----------+------------+--------+ FV prox       no                                             +--------------+---------+------+-----------+------------+--------+ FV mid        no                                             +--------------+---------+------+-----------+------------+--------+ FV dist       no                                             +--------------+---------+------+-----------+------------+--------+ Popliteal     no                                             +--------------+---------+------+-----------+------------+--------+  GSV at [facility]    no                            .54              +--------------+---------+------+-----------+------------+--------+ GSV prox thighno                            .60               +--------------+---------+------+-----------+------------+--------+ GSV mid thigh no                            .52              +--------------+---------+------+-----------+------------+--------+ GSV dist thighno                            .50              +--------------+---------+------+-----------+------------+--------+ GSV at knee   no                            .68              +--------------+---------+------+-----------+------------+--------+ GSV prox calf no                            .52              +--------------+---------+------+-----------+------------+--------+ SSV Pop Fossa no                                             +--------------+---------+------+-----------+------------+--------+   Summary: Bilateral: - No evidence of deep vein thrombosis seen in the lower extremities, bilaterally, from the common femoral through the popliteal veins.  - No evidence of superficial venous thrombosis in the lower extremities, bilaterally.  - No evidence of deep venous insufficiency seen bilaterally in the lower extremity.  - No evidence of superficial venous reflux seen in the greater saphenous veins bilaterally.  - No evidence of superficial venous reflux seen in the short saphenous veins bilaterally.  *See table(s) above for measurements and observations. Electronically signed by Hortencia Pilar MD on 11/22/2019 at 11:55:13 AM.    Final     ASSESSMENT & PLAN:   History of pulmonary embolus (PE)  # BIL PE-moderate to large clot burden without right heart strain. On Eliquis tolerating it well.  Stable continue indefinite anticoagulation.   #  Right lower extremity postphlebitic syndrome-[Dr.Schneir]- Worse; stable continue using her compression stockings; lymphedema machine/management as per vascular.  # Acute Gout [Dr.Bock; [facility]]-on steroids as per rheumatology.  #History of thyroid cancer- stable.   On Synthroid follows up with endocrinology.; Dr.Solum.   #  DISPOSITION:  # follow up in 6 months- MD [patient preference]-cbc/cmp-Dr.B       Cammie Sickle, MD 11/24/2019 12:52 PM

## 2019-11-24 NOTE — Progress Notes (Signed)
Patient reports dizziness this morning and mild shortness of breath. sats 100% RA. Patient did not eat prior to the apt. Glucose serum today is 228

## 2019-12-01 ENCOUNTER — Encounter (INDEPENDENT_AMBULATORY_CARE_PROVIDER_SITE_OTHER): Payer: Self-pay

## 2019-12-01 ENCOUNTER — Other Ambulatory Visit: Payer: Self-pay

## 2019-12-01 ENCOUNTER — Encounter (INDEPENDENT_AMBULATORY_CARE_PROVIDER_SITE_OTHER): Payer: Medicare Other | Admitting: Vascular Surgery

## 2019-12-01 ENCOUNTER — Ambulatory Visit (INDEPENDENT_AMBULATORY_CARE_PROVIDER_SITE_OTHER): Payer: Medicare Other | Admitting: Nurse Practitioner

## 2019-12-01 VITALS — BP 145/78 | HR 84 | Resp 16 | Wt 182.4 lb

## 2019-12-01 DIAGNOSIS — I890 Lymphedema, not elsewhere classified: Secondary | ICD-10-CM

## 2019-12-01 NOTE — Progress Notes (Signed)
History of Present Illness  There is no documented history at this time  Assessments & Plan   There are no diagnoses linked to this encounter.    Additional instructions  Subjective:  Patient presents with venous ulcer of the Bilateral lower extremity.    Procedure:  3 layer unna wrap was placed Bilateral lower extremity.   Plan:   Follow up in one week.  

## 2019-12-08 ENCOUNTER — Encounter (INDEPENDENT_AMBULATORY_CARE_PROVIDER_SITE_OTHER): Payer: Self-pay | Admitting: Nurse Practitioner

## 2019-12-08 ENCOUNTER — Other Ambulatory Visit: Payer: Self-pay

## 2019-12-08 ENCOUNTER — Ambulatory Visit (INDEPENDENT_AMBULATORY_CARE_PROVIDER_SITE_OTHER): Payer: Medicare Other | Admitting: Nurse Practitioner

## 2019-12-08 VITALS — BP 124/71 | HR 87 | Ht <= 58 in | Wt 180.0 lb

## 2019-12-08 DIAGNOSIS — I890 Lymphedema, not elsewhere classified: Secondary | ICD-10-CM | POA: Diagnosis not present

## 2019-12-08 NOTE — Progress Notes (Signed)
History of Present Illness  There is no documented history at this time  Assessments & Plan   There are no diagnoses linked to this encounter.    Additional instructions  Subjective:  Patient presents with venous ulcer of the Bilateral lower extremity.    Procedure:  3 layer unna wrap was placed Bilateral lower extremity.   Plan:   Follow up in one week.  

## 2019-12-15 ENCOUNTER — Encounter (INDEPENDENT_AMBULATORY_CARE_PROVIDER_SITE_OTHER): Payer: Medicare Other

## 2019-12-16 ENCOUNTER — Ambulatory Visit (INDEPENDENT_AMBULATORY_CARE_PROVIDER_SITE_OTHER): Payer: Medicare Other | Admitting: Nurse Practitioner

## 2019-12-16 ENCOUNTER — Encounter (INDEPENDENT_AMBULATORY_CARE_PROVIDER_SITE_OTHER): Payer: Self-pay

## 2019-12-16 ENCOUNTER — Other Ambulatory Visit: Payer: Self-pay

## 2019-12-16 ENCOUNTER — Other Ambulatory Visit (INDEPENDENT_AMBULATORY_CARE_PROVIDER_SITE_OTHER): Payer: Self-pay | Admitting: Vascular Surgery

## 2019-12-16 VITALS — BP 139/68 | HR 77 | Resp 16

## 2019-12-16 DIAGNOSIS — I890 Lymphedema, not elsewhere classified: Secondary | ICD-10-CM | POA: Diagnosis not present

## 2019-12-16 NOTE — Progress Notes (Signed)
History of Present Illness  There is no documented history at this time  Assessments & Plan   There are no diagnoses linked to this encounter.    Additional instructions  Subjective:  Patient presents with venous ulcer of the Bilateral lower extremity.    Procedure:  3 layer unna wrap was placed Bilateral lower extremity.   Plan:   Follow up in one week.  

## 2019-12-22 ENCOUNTER — Encounter (INDEPENDENT_AMBULATORY_CARE_PROVIDER_SITE_OTHER): Payer: Medicare Other

## 2019-12-22 ENCOUNTER — Encounter (INDEPENDENT_AMBULATORY_CARE_PROVIDER_SITE_OTHER): Payer: Self-pay

## 2019-12-29 ENCOUNTER — Ambulatory Visit (INDEPENDENT_AMBULATORY_CARE_PROVIDER_SITE_OTHER): Payer: Medicare Other | Admitting: Nurse Practitioner

## 2020-01-12 DIAGNOSIS — E538 Deficiency of other specified B group vitamins: Secondary | ICD-10-CM | POA: Insufficient documentation

## 2020-05-12 ENCOUNTER — Other Ambulatory Visit (INDEPENDENT_AMBULATORY_CARE_PROVIDER_SITE_OTHER): Payer: Self-pay | Admitting: Vascular Surgery

## 2020-05-17 ENCOUNTER — Other Ambulatory Visit: Payer: Self-pay | Admitting: Physician Assistant

## 2020-05-17 DIAGNOSIS — I739 Peripheral vascular disease, unspecified: Secondary | ICD-10-CM

## 2020-05-24 ENCOUNTER — Inpatient Hospital Stay: Payer: Medicare Other

## 2020-05-24 ENCOUNTER — Inpatient Hospital Stay: Payer: Medicare Other | Admitting: Internal Medicine

## 2020-05-25 ENCOUNTER — Telehealth: Payer: Self-pay

## 2020-05-25 ENCOUNTER — Ambulatory Visit (INDEPENDENT_AMBULATORY_CARE_PROVIDER_SITE_OTHER): Payer: Medicare Other

## 2020-05-25 ENCOUNTER — Other Ambulatory Visit: Payer: Self-pay

## 2020-05-25 DIAGNOSIS — I739 Peripheral vascular disease, unspecified: Secondary | ICD-10-CM | POA: Diagnosis not present

## 2020-05-25 NOTE — Telephone Encounter (Signed)
-----   Message from Rise Mu, PA-C sent at 05/25/2020 11:41 AM EDT ----- No evidence of obstructive arterial disease along the bilateral lower extremities. Leg pain is not due to PAD. Follow up with PCP and vascular surgery as directed.

## 2020-05-25 NOTE — Telephone Encounter (Signed)
call attempted. NA/NV

## 2020-05-29 NOTE — Telephone Encounter (Signed)
2nd attempt to contact the patient. No answer- no voice mail.

## 2020-05-30 NOTE — Telephone Encounter (Signed)
3rd attempt to contact the patient with results. NA/NV.Results letter mailed to the patient. closing this encounter.  Letter with results mailed to the patient.

## 2020-06-20 ENCOUNTER — Other Ambulatory Visit: Payer: Self-pay

## 2020-06-20 ENCOUNTER — Inpatient Hospital Stay: Payer: Medicare Other

## 2020-06-20 ENCOUNTER — Inpatient Hospital Stay: Payer: Medicare Other | Attending: Internal Medicine | Admitting: Internal Medicine

## 2020-06-20 DIAGNOSIS — Z7901 Long term (current) use of anticoagulants: Secondary | ICD-10-CM | POA: Insufficient documentation

## 2020-06-20 DIAGNOSIS — Z86718 Personal history of other venous thrombosis and embolism: Secondary | ICD-10-CM | POA: Diagnosis not present

## 2020-06-20 DIAGNOSIS — E039 Hypothyroidism, unspecified: Secondary | ICD-10-CM | POA: Insufficient documentation

## 2020-06-20 DIAGNOSIS — Z86711 Personal history of pulmonary embolism: Secondary | ICD-10-CM

## 2020-06-20 DIAGNOSIS — Z79899 Other long term (current) drug therapy: Secondary | ICD-10-CM | POA: Diagnosis not present

## 2020-06-20 DIAGNOSIS — E559 Vitamin D deficiency, unspecified: Secondary | ICD-10-CM | POA: Diagnosis not present

## 2020-06-20 DIAGNOSIS — D8489 Other immunodeficiencies: Secondary | ICD-10-CM | POA: Insufficient documentation

## 2020-06-20 DIAGNOSIS — E119 Type 2 diabetes mellitus without complications: Secondary | ICD-10-CM | POA: Diagnosis not present

## 2020-06-20 DIAGNOSIS — I10 Essential (primary) hypertension: Secondary | ICD-10-CM | POA: Diagnosis not present

## 2020-06-20 LAB — CBC WITH DIFFERENTIAL/PLATELET
Abs Immature Granulocytes: 0.01 10*3/uL (ref 0.00–0.07)
Basophils Absolute: 0.1 10*3/uL (ref 0.0–0.1)
Basophils Relative: 1 %
Eosinophils Absolute: 0.1 10*3/uL (ref 0.0–0.5)
Eosinophils Relative: 2 %
HCT: 42 % (ref 36.0–46.0)
Hemoglobin: 13.7 g/dL (ref 12.0–15.0)
Immature Granulocytes: 0 %
Lymphocytes Relative: 34 %
Lymphs Abs: 2 10*3/uL (ref 0.7–4.0)
MCH: 28.7 pg (ref 26.0–34.0)
MCHC: 32.6 g/dL (ref 30.0–36.0)
MCV: 87.9 fL (ref 80.0–100.0)
Monocytes Absolute: 0.5 10*3/uL (ref 0.1–1.0)
Monocytes Relative: 8 %
Neutro Abs: 3.2 10*3/uL (ref 1.7–7.7)
Neutrophils Relative %: 55 %
Platelets: 228 10*3/uL (ref 150–400)
RBC: 4.78 MIL/uL (ref 3.87–5.11)
RDW: 13.8 % (ref 11.5–15.5)
WBC: 5.8 10*3/uL (ref 4.0–10.5)
nRBC: 0 % (ref 0.0–0.2)

## 2020-06-20 LAB — COMPREHENSIVE METABOLIC PANEL
ALT: 33 U/L (ref 0–44)
AST: 30 U/L (ref 15–41)
Albumin: 3.9 g/dL (ref 3.5–5.0)
Alkaline Phosphatase: 54 U/L (ref 38–126)
Anion gap: 9 (ref 5–15)
BUN: 11 mg/dL (ref 6–20)
CO2: 25 mmol/L (ref 22–32)
Calcium: 8.7 mg/dL — ABNORMAL LOW (ref 8.9–10.3)
Chloride: 107 mmol/L (ref 98–111)
Creatinine, Ser: 0.41 mg/dL — ABNORMAL LOW (ref 0.44–1.00)
GFR, Estimated: >60 mL/min (ref >60)
Glucose, Bld: 139 mg/dL — ABNORMAL HIGH (ref 70–99)
Potassium: 3.9 mmol/L (ref 3.5–5.1)
Sodium: 141 mmol/L (ref 135–145)
Total Bilirubin: 0.5 mg/dL (ref 0.3–1.2)
Total Protein: 7.2 g/dL (ref 6.5–8.1)

## 2020-06-20 LAB — VITAMIN D 25 HYDROXY (VIT D DEFICIENCY, FRACTURES): Vit D, 25-Hydroxy: 31.15 ng/mL (ref 30–100)

## 2020-06-20 MED ORDER — FUROSEMIDE 20 MG PO TABS: 20.0000 mg | ORAL_TABLET | Freq: Every day | ORAL | 0 refills | Status: DC

## 2020-06-20 NOTE — Assessment & Plan Note (Addendum)
#   BIL PE-moderate to large clot burden without right heart strain. On Eliquis tolerating it well.  STABLE;  continue indefinite anticoagulation.  Hemoglobin stable renal function stable.  #  Right lower bil swelling pain- clinically not s/o DVT; pt compliant with eliquis; suspect-dependent edema/steroid use; 8 pound weight gain; no evidence of CHF.  Recommend Lasix 20 mg a day; discussed that patient is to follow-up with PCP regarding follow-up/check labs of potassium etc.  She should consider using her compression stockings; lymphedema machine/management as per vascular-once leg swelling improves..  # Hypocalcemia/myalgias joint pains- Ca- 8.6; recommend ca+vit D BID. Check vit D levels today.   # Acute Gout [Dr.Bock; [facility]]-intermittent steroids-on steroids as per rheumatology.  #History of thyroid cancer- STABLE; On Synthroid follows up with endocrinology Dr.Solum.  # COVID BOOSTER: Discussed given patient's diagnosis and other comorbidities/therapies-patient would be considered immunocompromised.  As per CDC recommendation/FDA approval-I would recommend booster vaccine.  Patient is interested.    # DISPOSITION: # lab today-  ordered # follow up in 6 months- MD [patient preference]-cbc/cmp/ vit D 25 [place]-Dr.B  Dr.Neelam Humphrey Rolls

## 2020-06-20 NOTE — Progress Notes (Signed)
[place] NOTE  Patient Care Team: Perrin Maltese, MD as PCP - General (Internal Medicine) Wellington Hampshire, MD as PCP - Cardiology (Cardiology) Perrin Maltese, MD as Referring Physician (Internal Medicine) Robert Bellow, MD (General Surgery)  CHIEF COMPLAINTS/PURPOSE OF CONSULTATION:  Bilateral PE & Right LE DVT  # June MID 2018 Bil PE mod-large/Right LE DVT- [immobility sec to chronic arthritis] s/p IVC filter [Dr.Schneir] on Eliquis. Sep 2019-  IVC filter explantation.  # DEC 2011- Thyroid cancer s/p sub total thyroidectomy & DM-2 [Dr.Solum; s/p  RAI; [facility] ].   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 OSA- CPAP; Gout of R knee- Nov 2019.   Oncology History   No history exists.     HISTORY OF PRESENTING ILLNESS:  Katie Baker 59 y.o.  female history of bilateral PE and right lower extremity DVT-currently on Eliquis is here for follow-up.  Patient has multiple complaints-complaining of swelling in the legs bilaterally.  She has gained about 8 pounds.  Complains of worsening joint pains back pain/leg pain. Patient has been on steroids intermittently for gout/joint pains.  However not on any steroids currently.  Patient continues to be compliant with her Eliquis.  No fevers or chills.  Review of Systems  Constitutional: Positive for malaise/fatigue. Negative for chills, diaphoresis, fever and weight loss.  HENT: Negative for nosebleeds and sore throat.   Eyes: Negative for double vision.  Respiratory: Negative for cough, hemoptysis, sputum production and wheezing.   Cardiovascular: Positive for leg swelling (Chronic right lower extremity swelling.). Negative for chest pain, palpitations and orthopnea.  Gastrointestinal: Negative for abdominal pain, blood in stool, constipation, diarrhea, heartburn, melena, nausea and vomiting.  Genitourinary: Negative for dysuria, frequency and  urgency.  Musculoskeletal: Positive for back pain and joint pain.  Skin: Negative.  Negative for itching and rash.  Neurological: Negative for dizziness, tingling, focal weakness, weakness and headaches.  Endo/Heme/Allergies: Does not bruise/bleed easily.  Psychiatric/Behavioral: Negative for depression. The patient is not nervous/anxious and does not have insomnia.      MEDICAL HISTORY:  Past Medical History:  Diagnosis Date  . Deep vein thrombosis (DVT) ([place])   . Diabetes mellitus, type II ([place])   . Dislocation of hip, congenital   . Fibromyalgia   . Gout   . Hearing loss    left ear  . Hyperlipidemia   . Hypertension   . Hypothyroidism   . Obesity   . Prediabetes   . Pulmonary embolism ([place]) 01/2017  . Reflux   . Sleep apnea   . Spinal stenosis   . Tendonitis   . Thyroid cancer [facility])     SURGICAL HISTORY: Past Surgical History:  Procedure Laterality Date  . COLONOSCOPY WITH PROPOFOL N/A 02/09/2015   Procedure: COLONOSCOPY WITH PROPOFOL;  Surgeon: [name], MD;  Location: [facility] ENDOSCOPY;  Service: Endoscopy;  Laterality: N/A;  . ESOPHAGOGASTRODUODENOSCOPY N/A 02/09/2015   Procedure: ESOPHAGOGASTRODUODENOSCOPY (EGD);  Surgeon: [name], MD;  Location: [facility] ENDOSCOPY;  Service: Endoscopy;  Laterality: N/A;  . HIP SURGERY    . IVC FILTER INSERTION N/A 02/14/2017   Procedure: IVC Filter Insertion;  Surgeon: Katha Cabal, MD;  Location: [place] CV LAB;  Service: Cardiovascular;  Laterality: N/A;  . IVC FILTER REMOVAL N/A 03/24/2018   Procedure: IVC FILTER REMOVAL;  Surgeon: Katha Cabal, MD;  Location: [place] CV LAB;  Service: Cardiovascular;  Laterality: N/A;  .  LIMB SPARING RESECTION HIP W/ SADDLE JOINT REPLACEMENT Bilateral   . THYROID SURGERY    . TOTAL HIP REVISION      SOCIAL HISTORY: Social History   Socioeconomic History  . Marital status: Divorced    Spouse name: Not on file  . Number of children: Not on file  .  Years of education: Not on file  . Highest education level: Not on file  Occupational History  . Not on file  Tobacco Use  . Smoking status: Never Smoker  . Smokeless tobacco: Never Used  Vaping Use  . Vaping Use: Never used  Substance and Sexual Activity  . Alcohol use: No    Alcohol/week: 0.0 standard drinks  . Drug use: No  . Sexual activity: Never  Other Topics Concern  . Not on file  Social History Narrative  . Not on file   Social Determinants of Health   Financial Resource Strain:   . Difficulty of Paying Living Expenses: Not on file  Food Insecurity:   . Worried About Charity fundraiser in the Last Year: Not on file  . Ran Out of Food in the Last Year: Not on file  Transportation Needs:   . Lack of Transportation (Medical): Not on file  . Lack of Transportation (Non-Medical): Not on file  Physical Activity:   . Days of Exercise per Week: Not on file  . Minutes of Exercise per Session: Not on file  Stress:   . Feeling of Stress : Not on file  Social Connections:   . Frequency of Communication with Friends and Family: Not on file  . Frequency of Social Gatherings with Friends and Family: Not on file  . Attends Religious Services: Not on file  . Active Member of Clubs or Organizations: Not on file  . Attends Archivist Meetings: Not on file  . Marital Status: Not on file  Intimate Partner Violence:   . Fear of Current or Ex-Partner: Not on file  . Emotionally Abused: Not on file  . Physically Abused: Not on file  . Sexually Abused: Not on file    FAMILY HISTORY: Family History  Problem Relation Age of Onset  . Heart disease Mother   . Hypertension Mother   . Alzheimer's disease Mother   . Diabetes Mother   . Stroke Mother   . Anxiety disorder Mother   . Breast cancer Mother        at 80 years  . Diabetes Father   . Alcohol abuse Father   . Bladder Cancer Sister   . Thyroid disease Brother   . Diabetes Daughter   . Diabetes Maternal Aunt      ALLERGIES:  has No Known Allergies.  MEDICATIONS:  Current Outpatient Medications  Medication Sig Dispense Refill  . allopurinol (ZYLOPRIM) 100 MG tablet One tablet daily, 90  days    . Biotin 10 MG TABS Take 10 mg by mouth daily.     . colchicine 0.6 MG tablet Take 1 tablet by mouth in the morning and at bedtime.    . cycloSPORINE (RESTASIS) 0.05 % ophthalmic emulsion Apply to eye.    . dapagliflozin propanediol (FARXIGA) 5 MG TABS tablet Take 10 mg by mouth daily.     Marland Kitchen dicyclomine (BENTYL) 10 MG capsule Take 20 mg by mouth every 6 (six) hours.     Marland Kitchen ELIQUIS 5 MG TABS tablet TAKE ONE TABLET BY MOUTH TWICE DAILY 60 tablet 5  . gabapentin (NEURONTIN) 300 MG  capsule Take 300 mg by mouth 2 (two) times daily.    Marland Kitchen glimepiride (AMARYL) 2 MG tablet Take 2 mg by mouth daily with breakfast.     . levothyroxine (SYNTHROID, LEVOTHROID) 175 MCG tablet Take 175 mcg by mouth daily before breakfast.   1  . lidocaine (LIDODERM) 5 % Place 1 patch onto the skin daily. For pain    . linaclotide (LINZESS) 145 MCG CAPS capsule Take 1 capsule by mouth daily.    Marland Kitchen lisinopril (PRINIVIL,ZESTRIL) 40 MG tablet Take 40 mg by mouth daily.    . Melatonin 5 MG CAPS Take by mouth at bedtime.     Marland Kitchen morphine (MS CONTIN) 15 MG 12 hr tablet Take 1 tablet (15 mg total) by mouth every 12 (twelve) hours. 30 tablet 0  . pantoprazole (PROTONIX) 40 MG tablet Take 40 mg by mouth 2 (two) times daily.   5  . RESTASIS 0.05 % ophthalmic emulsion     . rosuvastatin (CRESTOR) 40 MG tablet Take 40 mg by mouth daily.     . Semaglutide (OZEMPIC, 0.25 OR 0.5 MG/DOSE, [place]) Inject into the skin as directed.    Marland Kitchen acetaminophen (TYLENOL) 500 MG tablet Take 1,000 mg by mouth every 6 (six) hours as needed for moderate pain or headache. (Patient not taking: Reported on 06/20/2020)    . furosemide (LASIX) 20 MG tablet Take 1 tablet (20 mg total) by mouth daily. 30 tablet 0   No current facility-administered medications for this visit.       Marland Kitchen  PHYSICAL EXAMINATION:   Vitals:   06/20/20 1030  BP: (!) 145/86  Pulse: 74  Resp: 20  Temp: (!) 96.9 F (36.1 C)   Filed Weights   06/20/20 1036  Weight: 188 lb (85.3 kg)    Physical Exam Constitutional:      Comments: Frail-appearing female patient.  She is walks with a cane/chair.  She is accompanied by family.  HENT:     Head: Normocephalic and atraumatic.     Mouth/Throat:     Pharynx: No oropharyngeal exudate.  Eyes:     Pupils: Pupils are equal, round, and reactive to light.  Cardiovascular:     Rate and Rhythm: Normal rate and regular rhythm.  Pulmonary:     Effort: No respiratory distress.     Breath sounds: No wheezing.  Abdominal:     General: Bowel sounds are normal. There is no distension.     Palpations: Abdomen is soft. There is no mass.     Tenderness: There is no abdominal tenderness. There is no guarding or rebound.  Musculoskeletal:        General: No tenderness. Normal range of motion.     Cervical back: Normal range of motion and neck supple.     Right lower leg: Edema present.     Left lower leg: Edema present.     Comments: Bilateral pitting edema.  No tenderness.  Pulses intact.  Skin:    General: Skin is warm.  Neurological:     Mental Status: She is alert and oriented to person, place, and time.  Psychiatric:        Mood and Affect: Affect normal.      LABORATORY DATA:  I have reviewed the data as listed Lab Results  Component Value Date   WBC 5.8 06/20/2020   HGB 13.7 06/20/2020   HCT 42.0 06/20/2020   MCV 87.9 06/20/2020   PLT 228 06/20/2020   Recent Labs  07/22/19 1506 11/24/19 1001 06/20/20 1014  NA 140 138 141  K 3.9 3.6 3.9  CL 105 104 107  CO2 25 24 25   GLUCOSE 93 228* 139*  BUN 15 18 11   CREATININE 0.37* 0.56 0.41*  CALCIUM 8.9 8.6* 8.7*  GFRNONAA >60 >60 >60  GFRAA >60 >60  --   PROT  --  6.9 7.2  ALBUMIN  --  3.8 3.9  AST  --  18 30  ALT  --  25 33  ALKPHOS  --  61 54  BILITOT  --  0.4 0.5     RADIOGRAPHIC STUDIES: I have personally reviewed the radiological images as listed and agreed with the findings in the report. VAS [place] LOWER EXT ART SEG MULTI (SEGMENTALS & LE RAYNAUDS)  Result Date: 05/25/2020 LOWER EXTREMITY DOPPLER STUDY Indications: Bilateral leg pain, swelling and numbness for years, L>R. Symptoms              are highly variable as to severity and leg segment and, wax and              wane daily. Patient walks only occasionally and very short              distances, with a walker. Walking exacerbates pain. Does not              resolve with rest. Symptoms have been present for years.  Comparison Study: None. Performing Technologist: Pilar Jarvis RDMS, RVT, RDCS  Examination Guidelines: A complete evaluation includes at minimum, Doppler waveform signals and systolic blood pressure reading at the level of bilateral brachial, anterior tibial, and posterior tibial arteries, when vessel segments are accessible. Bilateral testing is considered an integral part of a complete examination. Photoelectric Plethysmograph (PPG) waveforms and toe systolic pressure readings are included as required and additional duplex testing as needed. Limited examinations for reoccurring indications may be performed as noted.  ABI Findings: +---------+------------------+-----+---------+--------+ Right    Rt Pressure (mmHg)IndexWaveform Comment  +---------+------------------+-----+---------+--------+ Brachial 124                    triphasic         +---------+------------------+-----+---------+--------+ CFA                             triphasic         +---------+------------------+-----+---------+--------+ Popliteal                       triphasic         +---------+------------------+-----+---------+--------+ ATA      174               1.40 triphasicNon-comp +---------+------------------+-----+---------+--------+ PTA      176               1.42 triphasicNon-comp  +---------+------------------+-----+---------+--------+ PERO     167               1.35 biphasic Non-comp +---------+------------------+-----+---------+--------+ Great Toe152               1.23 Normal            +---------+------------------+-----+---------+--------+ +---------+------------------+-----+---------+-------+ Left     Lt Pressure (mmHg)IndexWaveform Comment +---------+------------------+-----+---------+-------+ Brachial 124                    triphasic        +---------+------------------+-----+---------+-------+ CFA  triphasic        +---------+------------------+-----+---------+-------+ Popliteal                       triphasic        +---------+------------------+-----+---------+-------+ ATA      147               1.19 triphasic        +---------+------------------+-----+---------+-------+ PTA      150               1.21 triphasic        +---------+------------------+-----+---------+-------+ PERO     157               1.27 triphasic        +---------+------------------+-----+---------+-------+ Doristine Devoid Toe144               1.16 Normal           +---------+------------------+-----+---------+-------+ +-------+----------------+-----------+------------+------------+ ABI/TBIToday's ABI     Today's TBIPrevious ABIPrevious TBI +-------+----------------+-----------+------------+------------+ Right  Non-compressible1.23                                +-------+----------------+-----------+------------+------------+ Left   1.27            1.16                                +-------+----------------+-----------+------------+------------+  Summary: Right: Resting right ankle-brachial index indicates noncompressible right lower extremity arteries. The right toe-brachial index is normal. Left: Resting left ankle-brachial index is within normal range. No evidence of significant left lower extremity arterial disease.  The left toe-brachial index is normal.  *See table(s) above for measurements and observations.  Electronically signed by Carlyle Dolly MD on 05/25/2020 at 12:36:15 PM.    Final     ASSESSMENT & PLAN:   History of pulmonary embolus (PE)  # BIL PE-moderate to large clot burden without right heart strain. On Eliquis tolerating it well.  STABLE;  continue indefinite anticoagulation.  Hemoglobin stable renal function stable.  #  Right lower bil swelling pain- clinically not s/o DVT; pt compliant with eliquis; suspect-dependent edema/steroid use; 8 pound weight gain; no evidence of CHF.  Recommend Lasix 20 mg a day; discussed that patient is to follow-up with PCP regarding follow-up/check labs of potassium etc.  She should consider using her compression stockings; lymphedema machine/management as per vascular-once leg swelling improves..  # Hypocalcemia/myalgias joint pains- Ca- 8.6; recommend ca+vit D BID. Check vit D levels today.   # Acute Gout [Dr.Bock; [facility]]-intermittent steroids-on steroids as per rheumatology.  #History of thyroid cancer- STABLE; On Synthroid follows up with endocrinology Dr.Solum.  # COVID BOOSTER: Discussed given patient's diagnosis and other comorbidities/therapies-patient would be considered immunocompromised.  As per CDC recommendation/FDA approval-I would recommend booster vaccine.  Patient is interested.    # DISPOSITION: # lab today-  ordered # follow up in 6 months- MD [patient preference]-cbc/cmp/ vit D 25 [place]-Dr.B  Dr.Neelam Reymundo Poll, MD 06/20/2020 11:29 AM

## 2020-06-22 ENCOUNTER — Telehealth: Payer: Self-pay | Admitting: Internal Medicine

## 2020-06-22 NOTE — Telephone Encounter (Signed)
On 10/20-left voicemail for the patient reviewing the results of her blood work.  Vitamin D low normal.  Recommend continued vitamin D supplementation.  Recommend patient follow-up with PCP regarding swelling in the legs/weight gain; need for diuretics etc.   Patient follow-up with me as planned.

## 2020-07-11 NOTE — Progress Notes (Signed)
Hi Katie Baker- I wanted to let you know that the vitD levels are normal; although lower side. Continue follow up with PCP. If any questions call us back.  Thanks Dr.B

## 2020-07-20 ENCOUNTER — Telehealth (INDEPENDENT_AMBULATORY_CARE_PROVIDER_SITE_OTHER): Payer: Self-pay | Admitting: Vascular Surgery

## 2020-07-20 NOTE — Telephone Encounter (Signed)
Called stating that she has increased pain in her legs/feet along with numbness. Patient states that she has been using her compression socks and elevating her legs. She would like to come in to be seen. Patient was last seen 12-16-19 unna boot. Please advise.

## 2020-07-20 NOTE — Telephone Encounter (Signed)
Is the patient having increased swelling.  It sounds as if Katie Baker has been compliant with conservative therapy.  The patient has known neuropathy which would account for her numbness (Which we don't treat, Katie Baker would need to discuss with either her endocrinologist or PCP).  The neuropathy can also cause some pain and discomfort. The fibromyalgia with may also be a large factor. If the patient isn't having increased swelling associated with these symptoms, we may not be able to offer treatment for the symptoms Katie Baker is describing.

## 2020-07-20 NOTE — Telephone Encounter (Signed)
Patient would like to have ultrasound done to make sure that she does not have an dvt because she is very concern from previous history

## 2020-07-20 NOTE — Telephone Encounter (Signed)
We can bring her in for a DVT only study.  If it is negative, she will need to follow up with her PCP for further workup of her lower extremity pain.  If it is positive we will work her in

## 2020-07-21 NOTE — Telephone Encounter (Signed)
Called patient lvm for her to callback to get scheduled.

## 2020-08-04 ENCOUNTER — Other Ambulatory Visit: Payer: Self-pay | Admitting: Internal Medicine

## 2020-09-27 ENCOUNTER — Telehealth: Payer: Self-pay | Admitting: Cardiovascular Disease

## 2020-09-27 NOTE — Telephone Encounter (Signed)
Patient has been contacted at least 3 times for a recall, recall has been deleted

## 2020-11-09 ENCOUNTER — Other Ambulatory Visit: Payer: Self-pay | Admitting: Nurse Practitioner

## 2020-11-09 DIAGNOSIS — Z1231 Encounter for screening mammogram for malignant neoplasm of breast: Secondary | ICD-10-CM

## 2020-11-29 ENCOUNTER — Other Ambulatory Visit (INDEPENDENT_AMBULATORY_CARE_PROVIDER_SITE_OTHER): Payer: Self-pay | Admitting: Nurse Practitioner

## 2020-12-07 ENCOUNTER — Ambulatory Visit
Admission: RE | Admit: 2020-12-07 | Discharge: 2020-12-07 | Disposition: A | Discharge status: Home / Self Care | Payer: Medicare Other | Source: Ambulatory Visit | Attending: Nurse Practitioner | Admitting: Nurse Practitioner

## 2020-12-07 ENCOUNTER — Other Ambulatory Visit: Payer: Self-pay

## 2020-12-07 DIAGNOSIS — Z1231 Encounter for screening mammogram for malignant neoplasm of breast: Secondary | ICD-10-CM | POA: Insufficient documentation

## 2020-12-13 ENCOUNTER — Emergency Department: Payer: Medicare Other

## 2020-12-13 ENCOUNTER — Other Ambulatory Visit: Payer: Self-pay

## 2020-12-13 ENCOUNTER — Emergency Department
Admission: EM | Admit: 2020-12-13 | Discharge: 2020-12-13 | Disposition: A | Discharge status: Home / Self Care | Payer: Medicare Other | Attending: Emergency Medicine | Admitting: Emergency Medicine

## 2020-12-13 DIAGNOSIS — Z96649 Presence of unspecified artificial hip joint: Secondary | ICD-10-CM | POA: Insufficient documentation

## 2020-12-13 DIAGNOSIS — Z85850 Personal history of malignant neoplasm of thyroid: Secondary | ICD-10-CM | POA: Diagnosis not present

## 2020-12-13 DIAGNOSIS — Z20822 Contact with and (suspected) exposure to covid-19: Secondary | ICD-10-CM | POA: Diagnosis not present

## 2020-12-13 DIAGNOSIS — Z79899 Other long term (current) drug therapy: Secondary | ICD-10-CM | POA: Insufficient documentation

## 2020-12-13 DIAGNOSIS — M79605 Pain in left leg: Secondary | ICD-10-CM | POA: Insufficient documentation

## 2020-12-13 DIAGNOSIS — E119 Type 2 diabetes mellitus without complications: Secondary | ICD-10-CM | POA: Insufficient documentation

## 2020-12-13 DIAGNOSIS — E039 Hypothyroidism, unspecified: Secondary | ICD-10-CM | POA: Insufficient documentation

## 2020-12-13 DIAGNOSIS — Z7984 Long term (current) use of oral hypoglycemic drugs: Secondary | ICD-10-CM | POA: Diagnosis not present

## 2020-12-13 DIAGNOSIS — R0789 Other chest pain: Secondary | ICD-10-CM | POA: Insufficient documentation

## 2020-12-13 DIAGNOSIS — I10 Essential (primary) hypertension: Secondary | ICD-10-CM | POA: Diagnosis not present

## 2020-12-13 DIAGNOSIS — Z7901 Long term (current) use of anticoagulants: Secondary | ICD-10-CM | POA: Insufficient documentation

## 2020-12-13 LAB — RESP PANEL BY RT-PCR (FLU A&B, COVID) ARPGX2
Influenza A by PCR: NEGATIVE
Influenza B by PCR: NEGATIVE
SARS Coronavirus 2 by RT PCR: NEGATIVE

## 2020-12-13 LAB — CK: Total CK: 51 U/L (ref 38–234)

## 2020-12-13 LAB — CBC
HCT: 42.8 % (ref 36.0–46.0)
Hemoglobin: 14.2 g/dL (ref 12.0–15.0)
MCH: 29.5 pg (ref 26.0–34.0)
MCHC: 33.2 g/dL (ref 30.0–36.0)
MCV: 89 fL (ref 80.0–100.0)
Platelets: 220 10*3/uL (ref 150–400)
RBC: 4.81 MIL/uL (ref 3.87–5.11)
RDW: 13.5 % (ref 11.5–15.5)
WBC: 13.2 10*3/uL — ABNORMAL HIGH (ref 4.0–10.5)
nRBC: 0 % (ref 0.0–0.2)

## 2020-12-13 LAB — BASIC METABOLIC PANEL
Anion gap: 13 (ref 5–15)
BUN: 10 mg/dL (ref 6–20)
CO2: 24 mmol/L (ref 22–32)
Calcium: 8.9 mg/dL (ref 8.9–10.3)
Chloride: 102 mmol/L (ref 98–111)
Creatinine, Ser: 0.55 mg/dL (ref 0.44–1.00)
GFR, Estimated: >60 mL/min (ref >60)
Glucose, Bld: 160 mg/dL — ABNORMAL HIGH (ref 70–99)
Potassium: 3.2 mmol/L — ABNORMAL LOW (ref 3.5–5.1)
Sodium: 139 mmol/L (ref 135–145)

## 2020-12-13 LAB — TROPONIN I (HIGH SENSITIVITY)
Troponin I (High Sensitivity): 3 ng/L (ref <18)
Troponin I (High Sensitivity): 4 ng/L (ref <18)

## 2020-12-13 MED ORDER — IOHEXOL 350 MG/ML SOLN
75.0000 mL | Freq: Once | INTRAVENOUS | Status: AC | PRN
  Administered 2020-12-13: 75 mL via INTRAVENOUS

## 2020-12-13 MED ORDER — MORPHINE SULFATE ER 15 MG PO TBCR
15.0000 mg | EXTENDED_RELEASE_TABLET | ORAL | Status: AC
  Administered 2020-12-13: 15 mg via ORAL
  Filled 2020-12-13: qty 1

## 2020-12-13 NOTE — ED Provider Notes (Signed)
[facility] Emergency Department Provider Note   ____________________________________________   Event Date/Time   First MD Initiated Contact with Patient 12/13/20 1528     (approximate)  I have reviewed the triage vital signs and the nursing notes.   HISTORY  Chief Complaint Chest Pain and Leg Pain    HPI Katie Baker is a 60 y.o. female history of prior pulmonary embolism, DVT  4 days of chest discomfort.  Reports it feels like a pressure-like feeling across her chest is been steady and consistent.  Radiates slightly towards her mid back.  Denies any abdominal pain no lower back pain no urinary symptoms.  No fevers or chills no cough.  Denies feeling short of breath.  Does take MS Contin at home for musculoskeletal pains.  Uses a wheelchair relatively immobile.  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  Previous pulmonary embolism reports compliance with her Eliquis, takes it twice a day with a timer, and did take it at about 7 AM this morning  Lower legs left has been relatively sore started with a discomfort in her left ankle that worked its way up towards her left calf over the last 2 weeks.  Stable to walk on it, has difficulty with walking due to congenital abnormalities but reports muscles feel sore in the left leg.   No weakness no new numbness.  No cold or blue foot  Past Medical History:  Diagnosis Date  . Deep vein thrombosis (DVT) ([place])   . Diabetes mellitus, type II ([place])   . Dislocation of hip, congenital   . Fibromyalgia   . Gout   . Hearing loss    left ear  . Hyperlipidemia   . Hypertension   . Hypothyroidism   . Obesity   . Prediabetes   . Pulmonary embolism ([place]) 01/2017  . Reflux   . Sleep apnea   . Spinal stenosis   . Tendonitis   . Thyroid cancer [facility])     Patient Active Problem List   Diagnosis Date Noted  . Leg pain 10/28/2019  . Leg cramps 04/26/2019  . Primary osteoarthritis involving multiple joints  04/21/2019  . Type 2 diabetes mellitus without complication, without long-term current use of insulin ([place]) 01/14/2019  . CPDD (calcium pyrophosphate deposition disease) 10/21/2018  . Encounter for long-term (current) use of high-risk medication 10/21/2018  . Varicose veins of leg with swelling, bilateral 10/21/2018  . Post-phlebitic syndrome 03/14/2018  . Lymphedema 03/14/2018  . Diabetes ([place]) 03/19/2017  . History of pulmonary embolus (PE) 02/12/2017  . Pleuritic chest pain 02/12/2017  . Swelling of right lower extremity 02/12/2017  . Acute pain of right shoulder 08/29/2016  . Chronic gouty arthropathy without tophi 12/05/2015  . Gout 10/26/2015  . Cervical disc disease 09/05/2015  . Chronic tension-type headache, intractable 09/05/2015  . [facility] (congenital dislocation of the hip) 06/19/2015  . HLD (hyperlipidemia) 06/19/2015  . BP (high blood pressure) 06/19/2015  . Adiposity 06/19/2015  . Borderline diabetes mellitus 06/19/2015  . Obstructive apnea 06/19/2015  . H/O malignant neoplasm of thyroid 07/15/2014  . Hypothyroidism, postop 07/15/2014  . Degeneration of intervertebral disc of lumbar region 03/18/2013  . Fibromyalgia 03/18/2013  . LBP (low back pain) 03/18/2013  . Other intervertebral disc degeneration, lumbar region 03/18/2013  . Low back pain 03/18/2013    Past Surgical History:  Procedure Laterality Date  . COLONOSCOPY WITH PROPOFOL N/A 02/09/2015   Procedure: COLONOSCOPY WITH PROPOFOL;  Surgeon: [name], MD;  Location: [facility] ENDOSCOPY;  Service: Endoscopy;  Laterality: N/A;  . ESOPHAGOGASTRODUODENOSCOPY N/A 02/09/2015   Procedure: ESOPHAGOGASTRODUODENOSCOPY (EGD);  Surgeon: [name], MD;  Location: [facility] ENDOSCOPY;  Service: Endoscopy;  Laterality: N/A;  . HIP SURGERY    . IVC FILTER INSERTION N/A 02/14/2017   Procedure: IVC Filter Insertion;  Surgeon: Katha Cabal, MD;  Location: [facility] CV LAB;  Service: Cardiovascular;  Laterality:  N/A;  . IVC FILTER REMOVAL N/A 03/24/2018   Procedure: IVC FILTER REMOVAL;  Surgeon: Katha Cabal, MD;  Location: [facility];  Service: Cardiovascular;  Laterality: N/A;  . LIMB SPARING RESECTION HIP W/ SADDLE JOINT REPLACEMENT Bilateral   . THYROID SURGERY    . TOTAL HIP REVISION      Prior to Admission medications   Medication Sig Start Date End Date Taking? Authorizing Provider  acetaminophen (TYLENOL) 500 MG tablet Take 1,000 mg by mouth every 6 (six) hours as needed for moderate pain or headache. Patient not taking: Reported on 06/20/2020    [provider]  allopurinol (ZYLOPRIM) 100 MG tablet One tablet daily, 90  days 01/19/19   [provider]  Biotin 10 MG TABS Take 10 mg by mouth daily.     [provider]  colchicine 0.6 MG tablet Take 1 tablet by mouth in the morning and at bedtime. 05/15/20   [provider]  cycloSPORINE (RESTASIS) 0.05 % ophthalmic emulsion Apply to eye.    [provider]  dapagliflozin propanediol (FARXIGA) 5 MG TABS tablet Take 10 mg by mouth daily.     [provider]  dicyclomine (BENTYL) 10 MG capsule Take 20 mg by mouth every 6 (six) hours.     [provider]  ELIQUIS 5 MG TABS tablet TAKE ONE TABLET BY MOUTH TWICE DAILY 11/29/20   Kris Hartmann, NP  furosemide (LASIX) 20 MG tablet Take 1 tablet (20 mg total) by mouth daily. 06/20/20   Cammie Sickle, MD  gabapentin (NEURONTIN) 300 MG capsule Take 300 mg by mouth 2 (two) times daily.    [provider]  glimepiride (AMARYL) 2 MG tablet Take 2 mg by mouth daily with breakfast.  12/18/17 06/22/28  [provider]  levothyroxine (SYNTHROID, LEVOTHROID) 175 MCG tablet Take 175 mcg by mouth daily before breakfast.  12/02/16   [provider]  lidocaine (LIDODERM) 5 % Place 1 patch onto the skin daily. For pain    [provider]  linaclotide (LINZESS) 145 MCG CAPS capsule Take 1 capsule by  mouth daily. 04/28/20   [provider]  lisinopril (PRINIVIL,ZESTRIL) 40 MG tablet Take 40 mg by mouth daily.    [provider]  Melatonin 5 MG CAPS Take by mouth at bedtime.     [provider]  morphine (MS CONTIN) 15 MG 12 hr tablet Take 1 tablet (15 mg total) by mouth every 12 (twelve) hours. 02/17/17   Vaughan Basta, MD  pantoprazole (PROTONIX) 40 MG tablet Take 40 mg by mouth 2 (two) times daily.     [provider]  RESTASIS 0.05 % ophthalmic emulsion  04/07/19   [provider]  rosuvastatin (CRESTOR) 40 MG tablet Take 40 mg by mouth daily.     [provider]  Semaglutide (OZEMPIC, 0.25 OR 0.5 MG/DOSE, [place]) Inject into the skin as directed.    [provider]    Allergies Patient has no known allergies.  Family History  Problem Relation Age of Onset  .  Heart disease Mother   . Hypertension Mother   . Alzheimer's disease Mother   . Diabetes Mother   . Stroke Mother   . Anxiety disorder Mother   . Breast cancer Mother        at 42 years  . Diabetes Father   . Alcohol abuse Father   . Bladder Cancer Sister   . Thyroid disease Brother   . Diabetes Daughter   . Diabetes Maternal Aunt     Social History Social History   Tobacco Use  . Smoking status: Never Smoker  . Smokeless tobacco: Never Used  Vaping Use  . Vaping Use: Never used  Substance Use Topics  . Alcohol use: No    Alcohol/week: 0.0 standard drinks  . Drug use: No    Review of Systems Constitutional: No fever/chills Eyes: No visual changes. ENT: No sore throat. Cardiovascular: See HPI Respiratory: Denies shortness of breath. Gastrointestinal: No abdominal pain.   Genitourinary: Negative for dysuria. Musculoskeletal: Negative for back pain except chronic discomfort, especially chronic pain in her hips and see HPI regarding left leg discomfort. Skin: Negative for rash. Neurological: Negative for headaches, areas of focal weakness or  numbness.    ____________________________________________   PHYSICAL EXAM:  VITAL SIGNS: ED Triage Vitals  Enc Vitals Group     BP 12/13/20 1433 (!) 154/88     Pulse Rate 12/13/20 1433 (!) 110     Resp 12/13/20 1433 18     Temp 12/13/20 1438 99.6 F (37.6 C)     Temp Source 12/13/20 1438 Oral     SpO2 12/13/20 1433 96 %     Weight 12/13/20 1435 180 lb (81.6 kg)     Height 12/13/20 1435 4\' 8"  (1.422 m)     Head Circumference --      Peak Flow --      Pain Score 12/13/20 1435 10     Pain Loc --      Pain Edu? --      Excl. in [place]? --     Constitutional: Alert and oriented. Well appearing and in no acute distress.  She is very pleasant. Eyes: Conjunctivae are normal. Head: Atraumatic. Nose: No congestion/rhinnorhea. Mouth/Throat: Mucous membranes are moist. Neck: No stridor.  Cardiovascular: Normal rate, regular rhythm. Grossly normal heart sounds.  Good peripheral circulation. Respiratory: Normal respiratory effort.  No retractions. Lungs CTAB.  Speaks in full clear sentences. Gastrointestinal: Soft and nontender. No distention. Musculoskeletal:  Lower Extremities  No edema. Normal DP/PT pulses bilateral with good cap refill.  Normal neuro-motor function lower extremities bilateral.  RIGHT Right lower extremity demonstrates normal strength, good use of all muscles. No edema bruising or contusions of the right hip, right knee, right ankle. Full range of motion of the right lower extremity without pain. No pain on axial loading. No evidence of trauma.  LEFT Left lower extremity demonstrates normal strength, good use of all muscles. No edema bruising or contusions of the hip,  knee, ankle. Full range of motion of the left lower extremity with some discomfort, and the left leg is shorter than the right but she reports this to be chronic. No pain on axial loading. No evidence of trauma.  Strong and normal perfusion lower extremities bilateral.  She does report some  tenderness to palpation of the mid left calf but it is not appreciably edematous or swollen.  No pain along the Achilles.  There is no overlying erythema or warmth over the knee joint.  Neurologic:  Normal speech and language. No gross focal neurologic deficits are appreciated.  Skin:  Skin is warm, dry and intact. No rash noted. Psychiatric: Mood and affect are normal. Speech and behavior are normal.  ____________________________________________   LABS (all labs ordered are listed, but only abnormal results are displayed)  Labs Reviewed  BASIC METABOLIC PANEL - Abnormal; Notable for the following components:      Result Value   Potassium 3.2 (*)    Glucose, Bld 160 (*)    All other components within normal limits  CBC - Abnormal; Notable for the following components:   WBC 13.2 (*)    All other components within normal limits  RESP PANEL BY RT-PCR (FLU A&B, COVID) ARPGX2  CK  TROPONIN I (HIGH SENSITIVITY)  TROPONIN I (HIGH SENSITIVITY)   ____________________________________________  EKG  Reviewed inter by me at 1435 Heart rate 105 QRS 90 QTc 430 Sinus tachycardia ____________________________________________  RADIOLOGY  DG Chest 2 View  Result Date: 12/13/2020 CLINICAL DATA:  Chest pain for 4 days with shortness of breath. Nausea. EXAM: CHEST - 2 VIEW COMPARISON:  Radiograph 05/23/2018 FINDINGS: The cardiomediastinal contours are unchanged. Aortic atherosclerosis. Mild subsegmental bibasilar atelectasis. Pulmonary vasculature is normal. No consolidation, pleural effusion, or pneumothorax. No acute osseous abnormalities are seen. IMPRESSION: Mild subsegmental bibasilar atelectasis. Electronically Signed   By: Keith Rake M.D.   On: 12/13/2020 15:22   DG Knee 2 Views Left  Result Date: 12/13/2020 CLINICAL DATA:  Two weeks of pain EXAM: LEFT KNEE - 1-2 VIEW COMPARISON:  None. FINDINGS: No fracture or malalignment. Small knee effusion. Mild patellofemoral and medial  joint space degenerative change. Lateral joint space calcifications. IMPRESSION: 1. Mild degenerative changes with small knee effusion. 2. Chondrocalcinosis. Electronically Signed   By: Donavan Foil M.D.   On: 12/13/2020 19:08   CT Angio Chest PE W and/or Wo Contrast  Result Date: 12/13/2020 CLINICAL DATA:  Chest pain for 4 days, short of breath, nausea, left leg pain for 2 weeks EXAM: CT ANGIOGRAPHY CHEST WITH CONTRAST TECHNIQUE: Multidetector CT imaging of the chest was performed using the standard protocol during bolus administration of intravenous contrast. Multiplanar CT image reconstructions and MIPs were obtained to evaluate the vascular anatomy. CONTRAST:  25mL OMNIPAQUE IOHEXOL 350 MG/ML SOLN COMPARISON:  12/13/2020, 07/14/2019 FINDINGS: Cardiovascular: This is a technically adequate evaluation of the pulmonary vasculature. There are no filling defects or pulmonary emboli. The heart is unremarkable without pericardial effusion. Normal caliber of the thoracic aorta without dissection. Mild atherosclerosis of the aortic arch. Mediastinum/Nodes: The thyroid is surgically absent. Trachea and esophagus are normal. No pathologic adenopathy. Lungs/Pleura: No acute airspace disease, effusion, or pneumothorax. The central airways are patent. Upper Abdomen: Diffuse hepatic steatosis. No acute upper abdominal process. Musculoskeletal: No acute or destructive bony lesions. Reconstructed images demonstrate no additional findings. Review of the MIP images confirms the above findings. IMPRESSION: 1. No evidence of pulmonary embolus. 2. No acute intrathoracic process. 3.  Aortic Atherosclerosis (ICD10-I70.0). 4. Hepatic steatosis. Electronically Signed   By: Randa Ngo M.D.   On: 12/13/2020 17:24   US Venous Img Lower Unilateral Left  Result Date: 12/13/2020 CLINICAL DATA:  Left lower extremity pain and edema for the past 2 weeks. Evaluate for DVT. EXAM: LEFT LOWER EXTREMITY VENOUS DOPPLER ULTRASOUND  TECHNIQUE: Gray-scale sonography with graded compression, as well as color Doppler and duplex ultrasound were performed to evaluate the lower extremity deep venous systems from the level of the common femoral vein and including  the common femoral, femoral, profunda femoral, popliteal and calf veins including the posterior tibial, peroneal and gastrocnemius veins when visible. The superficial great saphenous vein was also interrogated. Spectral Doppler was utilized to evaluate flow at rest and with distal augmentation maneuvers in the common femoral, femoral and popliteal veins. COMPARISON:  Bilateral lower extremity venous Doppler ultrasound-02/13/2017 (positive for DVT affecting the right common femoral vein; negative for left lower extremity DVT) FINDINGS: Contralateral Common Femoral Vein: Respiratory phasicity is normal and symmetric with the symptomatic side. No evidence of acute or chronic thrombus. Normal compressibility. Common Femoral Vein: No evidence of thrombus. Normal compressibility, respiratory phasicity and response to augmentation. Saphenofemoral Junction: No evidence of thrombus. Normal compressibility and flow on color Doppler imaging. Profunda Femoral Vein: No evidence of thrombus. Normal compressibility and flow on color Doppler imaging. Femoral Vein: No evidence of thrombus. Normal compressibility, respiratory phasicity and response to augmentation. Popliteal Vein: No evidence of thrombus. Normal compressibility, respiratory phasicity and response to augmentation. Calf Veins: No evidence of thrombus. Normal compressibility and flow on color Doppler imaging. Superficial Great Saphenous Vein: No evidence of thrombus. Normal compressibility. Venous Reflux:  None. Other Findings:  None. IMPRESSION: 1. No evidence of DVT within the left lower extremity. 2. No evidence of acute or chronic DVT within the contralateral right common femoral vein. Electronically Signed   By: Sandi Mariscal M.D.   On:  12/13/2020 15:53  Ultrasound negative for DVT.  CT angiogram negative for acute PE.  No acute chest findings.  X-ray left knee with degenerative changes and small knee effusion. ____________________________________________   PROCEDURES  Procedure(s) performed: None  Procedures  Critical Care performed: No  ____________________________________________   INITIAL IMPRESSION / ASSESSMENT AND PLAN / ED COURSE  Pertinent labs & imaging results that were available during my care of the patient were reviewed by me and considered in my medical decision making (see chart for details).   Patient reports chest discomfort, atypical with regard to ACS, reassuring EKG with regard to a ACS risk, but the patient does report of chest discomfort as well as a preceding left leg pain and discomfort with a history of DVT though she is currently anticoagulated.  We will certainly pursue and rule out pulmonary embolism, left lower extremity ultrasound has returned already and is negative for DVT which is somewhat reassuring.  Differential diagnosis broad, but includes ACS, pulmonary embolism, dissection.  No GI symptoms.  Abdominal pain  Differential diagnosis includes, but is not limited to, ACS, aortic dissection, pulmonary embolism, cardiac tamponade, pneumothorax, pneumonia, pericarditis, myocarditis, GI-related causes including esophagitis/gastritis, and musculoskeletal chest wall pain.    Also send CK to evaluate for potential myositis.  Do not see evidence of acute edema    Given the patient's history of previous and significant pulmonary embolism and DVT even though she is anticoagulated, with her presentation of tachycardia and chest pain I certainly do have     HEAR Path score = 4. Trop appropriate for outpatient cardiology.   Discussed with the patient, and her work-up here is quite reassuring.  She has minimal leukocytosis but appears to have had this in the past as well.  She is afebrile.  She  does not have complaints of fever and her exam is quite reassuring.  She does report that she is having increasing left knee and left lower leg pain, she has a long history of arthritic and rheumatologic issues and I suspect that is her contributing to this as well today.  I  discussed with her and she will call her rheumatologist tomorrow, in addition she is followed by a pain physician Dr. Yancey Flemings, she will also call him tomorrow and arrange follow-up with cardiology.  Patient is stable, felt appropriate for further outpatient evaluation with the plan to follow-up with rheumatology, cardiology, and discussed pain options with her pain physician.  She does tell me that she has been on the same dose of MS Contin for quite some time and it seems to have lost its effect at alleviating her pain, we did discuss this.  She is very pleasant appropriate and family here taking her home with plans for follow-up  ____________________________________________   FINAL CLINICAL IMPRESSION(S) / ED DIAGNOSES  Final diagnoses:  Chest pain, atypical  Left leg pain        Note:  This document was prepared using Dragon voice recognition software and may include unintentional dictation errors       Delman Kitten, MD 12/14/20 [phone]

## 2020-12-13 NOTE — ED Notes (Signed)
Katie Code, MD at bedside.

## 2020-12-13 NOTE — ED Notes (Signed)
Patient at CT

## 2020-12-13 NOTE — ED Triage Notes (Signed)
Pt to ED POV for chest pain x4 days with shob. +nausea.  Reports left leg pain x 2 weeks, no injury. No redness or swelling noted. Pt in NAD Hx dvt Verbal order for [place] left leg dr Jari Pigg

## 2020-12-19 ENCOUNTER — Inpatient Hospital Stay: Payer: Medicare Other | Attending: Internal Medicine

## 2020-12-19 ENCOUNTER — Inpatient Hospital Stay: Payer: Medicare Other | Admitting: Internal Medicine

## 2020-12-20 ENCOUNTER — Ambulatory Visit (INDEPENDENT_AMBULATORY_CARE_PROVIDER_SITE_OTHER): Payer: Medicare Other | Admitting: Family

## 2020-12-20 ENCOUNTER — Encounter: Payer: Self-pay | Admitting: Family

## 2020-12-20 ENCOUNTER — Other Ambulatory Visit: Payer: Self-pay

## 2020-12-20 VITALS — BP 108/60 | HR 69 | Ht <= 58 in

## 2020-12-20 DIAGNOSIS — M79605 Pain in left leg: Secondary | ICD-10-CM

## 2020-12-20 DIAGNOSIS — I10 Essential (primary) hypertension: Secondary | ICD-10-CM | POA: Diagnosis not present

## 2020-12-20 DIAGNOSIS — R0789 Other chest pain: Secondary | ICD-10-CM | POA: Diagnosis not present

## 2020-12-20 DIAGNOSIS — Z7901 Long term (current) use of anticoagulants: Secondary | ICD-10-CM | POA: Diagnosis not present

## 2020-12-20 DIAGNOSIS — M797 Fibromyalgia: Secondary | ICD-10-CM

## 2020-12-20 DIAGNOSIS — Z86718 Personal history of other venous thrombosis and embolism: Secondary | ICD-10-CM

## 2020-12-20 DIAGNOSIS — F419 Anxiety disorder, unspecified: Secondary | ICD-10-CM

## 2020-12-20 DIAGNOSIS — M79604 Pain in right leg: Secondary | ICD-10-CM

## 2020-12-20 DIAGNOSIS — E782 Mixed hyperlipidemia: Secondary | ICD-10-CM

## 2020-12-20 DIAGNOSIS — G4733 Obstructive sleep apnea (adult) (pediatric): Secondary | ICD-10-CM

## 2020-12-20 NOTE — Patient Instructions (Addendum)
Medication Instructions:  Continue your current medications.   *If you need a refill on your cardiac medications before your next appointment, please call your pharmacy*   Lab Work: None ordered today.   Testing/Procedures: Your EKG today shows normal sinus rhythm which is a good result. No evidence of blockages or abnormal heart rhythms.   Follow-Up: At [facility], you and your health needs are our priority.  As part of our continuing mission to provide you with exceptional heart care, we have created designated Provider Care Teams.  These Care Teams include your primary Cardiologist (physician) and Advanced Practice Providers (APPs -  Physician Assistants and Nurse Practitioners) who all work together to provide you with the care you need, when you need it.  We recommend signing up for the patient portal called "MyChart".  Sign up information is provided on this After Visit Summary.  MyChart is used to connect with patients for Virtual Visits (Telemedicine).  Patients are able to view lab/test results, encounter notes, upcoming appointments, etc.  Non-urgent messages can be sent to your provider as well.   To learn more about what you can do with MyChart, go to [email or web address].    Your next appointment:   6 months  The format for your next appointment:   In Person  Provider:   You may see Kathlyn [place], MD or one of the following Advanced Practice Providers on your designated Care Team:    Murray Hodgkins, NP  Christell Faith, PA-C  Marrianne Mood, PA-C  Cadence Kathlen Mody, [place]  Laurann [place], NP   Other Instructions  Recommend following up with your primary care provider regarding leg pain.   Heart Healthy Diet Recommendations: A low-salt diet is recommended. Meats should be grilled, baked, or boiled. Avoid fried foods. Focus on lean protein sources like fish or chicken with vegetables and fruits. The American Heart Association is a Microbiologist!  American  Heart Association Diet and Lifeystyle Recommendations   Exercise recommendations: The American Heart Association recommends 150 minutes of moderate intensity exercise weekly. Try 30 minutes of moderate intensity exercise 4-5 times per week. This could include walking, jogging, or swimming.

## 2020-12-20 NOTE — Progress Notes (Signed)
Office Visit    Patient Name: Katie Baker Date of Encounter: 12/20/2020  PCP:  Perrin Maltese, MD   [place]  Cardiologist:  Kathlyn Sacramento, MD  Advanced Practice Provider:  No care team member to display Electrophysiologist:  None   Chief Complaint    Katie Baker is a 60 y.o. 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   Past Medical History    Past Medical History:  Diagnosis Date  . Deep vein thrombosis (DVT) ([place])   . Diabetes mellitus, type II ([place])   . Dislocation of hip, congenital   . Fibromyalgia   . Gout   . Hearing loss    left ear  . Hyperlipidemia   . Hypertension   . Hypothyroidism   . Obesity   . Prediabetes   . Pulmonary embolism ([place]) 01/2017  . Reflux   . Sleep apnea   . Spinal stenosis   . Tendonitis   . Thyroid cancer [facility])    Past Surgical History:  Procedure Laterality Date  . COLONOSCOPY WITH PROPOFOL N/A 02/09/2015   Procedure: COLONOSCOPY WITH PROPOFOL;  Surgeon: [name], MD;  Location: [facility] ENDOSCOPY;  Service: Endoscopy;  Laterality: N/A;  . ESOPHAGOGASTRODUODENOSCOPY N/A 02/09/2015   Procedure: ESOPHAGOGASTRODUODENOSCOPY (EGD);  Surgeon: [name], MD;  Location: [facility] ENDOSCOPY;  Service: Endoscopy;  Laterality: N/A;  . HIP SURGERY    . IVC FILTER INSERTION N/A 02/14/2017   Procedure: IVC Filter Insertion;  Surgeon: Katha Cabal, MD;  Location: [place] CV LAB;  Service: Cardiovascular;  Laterality: N/A;  . IVC FILTER REMOVAL N/A 03/24/2018   Procedure: IVC FILTER  REMOVAL;  Surgeon: Katha Cabal, MD;  Location: [place] CV LAB;  Service: Cardiovascular;  Laterality: N/A;  . LIMB SPARING RESECTION HIP W/ SADDLE JOINT REPLACEMENT Bilateral   . THYROID SURGERY    . TOTAL HIP REVISION      Allergies  No Known Allergies  History of Present Illness    Katie Baker is a 60 y.o. 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  Previous cardiac workup includes echo 01/2017 in setting of bilateral PE and DVT affecting right common femoral and superficial femoral veins with normal LVEF, RV normal size and function. She was evaluated for chest pain 07/2017 with Lexiscan Myoview no significant ischemia, EF 73%, low risk scan. She underwent coronary CTA 07/15/19 due to persistent chest pain with calcium score of 0 with no obstructive CAD. Echo 07/26/19 LVEF 60-65%, no significant valvular abnormalities. When seen 07/2019 she noted PAD poster in exam room and requested testing which was ordered but not completed. Since that time she has established with vascular. Lower extremity venous reflux study 11/2019 no evidence of DVT. Lower extremity ABI 05/2020 no evidence of obstructive  arterial disease bilaterally.   She was seen in the ED 12/13/20 with 4 day history of chest discomfort sensation of pressure across chest radiating to back. She also noted LLE pain and LLE duplex was negative for DVT. Lab work only remarkable for minimal leukocytosis. HS-troponin 4, CK 51.  CT chest mild atherosclerosis of aortic arch and no mention of coronary calcification. She was recommended to  follow up with rheumatology, pain physician, and cardiology.   She presents today for follow up. Reports continued chest discomfort which occurs at rest and with activity. It is tender on palpation. No recent injury. We reviewed ED tesing and reassuring CT, troponin, and EKGs. Denies dyspnea on exertion, shortness of breath at rest. No wheeze nor cough. She reports no palpitations, lightheadedness, dizziness, nor syncope. She reports continued LE edema which improves with Lasix as prescribed by outside provider. Likely etiology venous insufficiency, she does try to elevate her legs in her hospital bed at home.  EKGs/Labs/Other Studies Reviewed:   The following studies were reviewed today:   EKG:  EKG is  ordered today.  The ekg ordered today demonstrates NSR 69 bpm with no acute ST/T wave changes.  Recent Labs: 06/20/2020: ALT 33 12/13/2020: BUN 10; Creatinine, Ser 0.55; Hemoglobin 14.2; Platelets 220; Potassium 3.2; Sodium 139  Recent Lipid Panel No results found for: CHOL, TRIG, HDL, CHOLHDL, VLDL, LDLCALC, LDLDIRECT  Home Medications   Current Meds  Medication Sig  . acetaminophen (TYLENOL) 500 MG tablet Take 1,000 mg by mouth every 6 (six) hours as needed for moderate pain or headache.  . B Complex Vitamins (VITAMIN B COMPLEX PO) Take 1 tablet by mouth daily.  . Biotin 10 MG TABS Take 10 mg by mouth daily.   . Cholecalciferol 25 MCG (1000 UT) tablet Take 5,000 Units by mouth daily.  . cycloSPORINE (RESTASIS) 0.05 % ophthalmic emulsion Apply to eye.  . dapagliflozin propanediol (FARXIGA) 5 MG TABS tablet Take 10 mg by mouth daily.   Marland Kitchen dicyclomine (BENTYL) 10 MG capsule Take 20 mg by mouth every 6 (six) hours.   Marland Kitchen ELIQUIS 5 MG TABS tablet TAKE ONE TABLET BY MOUTH TWICE DAILY  . furosemide (LASIX) 40 MG tablet Take 40 mg by mouth daily.  Marland Kitchen gabapentin (NEURONTIN) 300 MG capsule Take 300 mg by mouth 2 (two) times daily.  Marland Kitchen glimepiride (AMARYL) 2 MG tablet Take 2 mg by mouth daily with  breakfast.   . levothyroxine (SYNTHROID, LEVOTHROID) 175 MCG tablet Take 175 mcg by mouth daily before breakfast.   . lidocaine (LIDODERM) 5 % Place 1 patch onto the skin daily. For pain  . linaclotide (LINZESS) 145 MCG CAPS capsule Take 1 capsule by mouth daily.  Marland Kitchen lisinopril (PRINIVIL,ZESTRIL) 40 MG tablet Take 40 mg by mouth daily.  Marland Kitchen morphine (MS CONTIN) 15 MG 12 hr tablet Take 1 tablet (15 mg total) by mouth every 12 (twelve) hours.  . Multiple Vitamins-Minerals (VITAMIN-MINERAL SUPPLEMENT PO) Take by mouth daily. Calcium, Magnesium, and Zinc plus Vitamin D-3  . pantoprazole (PROTONIX) 40 MG tablet Take 40 mg by mouth 2 (two) times daily.   . potassium chloride (MICRO-K) 10 MEQ CR capsule Take 10 mEq by mouth every morning.  . predniSONE (DELTASONE) 10 MG tablet   . RESTASIS 0.05 % ophthalmic emulsion   . rosuvastatin (CRESTOR) 40 MG tablet Take 40 mg by mouth daily.   . Semaglutide (OZEMPIC, 0.25 OR 0.5 MG/DOSE, [place]) Inject into the skin as directed.  Marland Kitchen VITAMIN E PO Take by  mouth daily.     Review of Systems  All other systems reviewed and are otherwise negative except as noted above.  Physical Exam    VS:  BP 108/60   Pulse 69   Ht 4\' 8"  (1.422 m)   LMP 07/04/2015 (Approximate) Comment: >2 years ago  BMI 40.36 kg/m  , BMI Body mass index is 40.36 kg/m.  Wt Readings from Last 3 Encounters:  12/13/20 180 lb (81.6 kg)  06/20/20 188 lb (85.3 kg)  12/08/19 180 lb (81.6 kg)     GEN: Well nourished, overweight, well developed, in no acute distress. HEENT: normal. Neck: Supple, no JVD, carotid bruits, or masses. Cardiac: RRR, no murmurs, rubs, or gallops. No clubbing, cyanosis. Trace bilateral LE edema. Radials/PT 2+ and equal bilaterally.  Respiratory:  Respirations regular and unlabored, clear to auscultation bilaterally. GI: Soft, nontender, nondistended. MS: No deformity or atrophy. Chest wall tender on palpation Skin: Warm and dry, no rash. Neuro:  Strength and sensation  are intact. Psych: Normal affect.  Assessment & Plan    1. Atypical chest pain - ED visit 12/13/20 with chest pain for 4 days at rest. CT chest without acute findings, HS-troponin 4, EKG non acute. EKG today shows NSR 69 bpm no acute ST/T wave changes. Cardiac CTA 07/2019 calcium score of 0. Low suspicion angina as chest pain occurs at rest and is tender on palpation. Likely etiology musculoskeletal, fibromyalgia. No indication for ischemic evaluation at this time.   2. Leg discomfort - Venous duplex and ABIs unremarkable within the last year unremarkable. Pain is present at rest and likely etiology musculoskeletal, significant orthopedic history including bilateral hip replacement and repair. Further follow up per PCP and rheumatology.  3. History of DVT/PE / Chronic anticoagulation- On lifelong anticoagulation with Eliquis. Denies bleeding complications. Does not meet dose reduction criteria. LE duplex during recent ED visit with no evidence of recurrence. CT angio during recent ED visit with no evidence of PE.   4. Fibromyalgia/anxiety - Likely contributory to her pain. Recently started on prednisone by rheumatology. Reports significant anxiety regarding cardiac health and reassurance provided.  5. HTN - BP well controlled. Continue current antihypertensive regimen.   6. HLD - Continue Crestor 40mg  QD.  7. OSA - Reports CPAP compliance.   Disposition: Follow up in 6 month(s) with Dr. Fletcher Anon or APP   Signed, Loel Dubonnet, NP 12/20/2020, 12:34 PM [place]

## 2021-06-21 ENCOUNTER — Other Ambulatory Visit (INDEPENDENT_AMBULATORY_CARE_PROVIDER_SITE_OTHER): Payer: Self-pay | Admitting: Nurse Practitioner

## 2021-07-02 ENCOUNTER — Encounter (INDEPENDENT_AMBULATORY_CARE_PROVIDER_SITE_OTHER): Payer: Self-pay | Admitting: Nurse Practitioner

## 2021-07-02 ENCOUNTER — Ambulatory Visit (INDEPENDENT_AMBULATORY_CARE_PROVIDER_SITE_OTHER): Payer: Medicare Other | Admitting: Nurse Practitioner

## 2021-07-02 ENCOUNTER — Other Ambulatory Visit: Payer: Self-pay

## 2021-07-02 VITALS — BP 127/78 | HR 86 | Ht <= 58 in | Wt 150.0 lb

## 2021-07-02 DIAGNOSIS — I10 Essential (primary) hypertension: Secondary | ICD-10-CM | POA: Diagnosis not present

## 2021-07-02 DIAGNOSIS — M79604 Pain in right leg: Secondary | ICD-10-CM

## 2021-07-02 DIAGNOSIS — I890 Lymphedema, not elsewhere classified: Secondary | ICD-10-CM | POA: Diagnosis not present

## 2021-07-02 DIAGNOSIS — M5136 Other intervertebral disc degeneration, lumbar region: Secondary | ICD-10-CM

## 2021-07-02 DIAGNOSIS — M79605 Pain in left leg: Secondary | ICD-10-CM

## 2021-07-02 DIAGNOSIS — M797 Fibromyalgia: Secondary | ICD-10-CM

## 2021-07-02 DIAGNOSIS — R0789 Other chest pain: Secondary | ICD-10-CM

## 2021-07-02 NOTE — Progress Notes (Signed)
Subjective:    Patient ID: Katie Baker, female    DOB: 03/19/1961, 60 y.o.   MRN: [id number] Chief Complaint  Patient presents with   Follow-up    Swelling and cramping in legs wanting to get them checked out haven't been seen since 2021    Katie Baker is a 60 year old female that presents today for evaluation of swelling and cramping in her lower extremities.  The patient does have a known history of lymphedema.  The patient has a lymphedema pump.  However she does not utilize compression socks or her lymphedema pump.  She notes that compression socks are difficult to put on as is the lymphedema pump.  She has pain in her legs and notes that the pump makes it worse.  The patient also has a history of fibromyalgia.  She also has a history of issues within her lumbar spine.  She also has a previous history of DVT and pulmonary embolism.  She notes that the cramping is not consistent.  She is not monitoring her legs that she does not have claudication-like symptoms.   Review of Systems  Musculoskeletal:  Positive for gait problem.  Neurological:  Positive for weakness.  All other systems reviewed and are negative.     Objective:   Physical Exam Vitals reviewed.  HENT:     Head: Normocephalic.  Cardiovascular:     Rate and Rhythm: Normal rate.  Pulmonary:     Effort: Pulmonary effort is normal.  Musculoskeletal:     Right lower leg: 3+ Edema present.     Left lower leg: 3+ Edema present.  Skin:    General: Skin is warm and dry.  Neurological:     Mental Status: She is alert and oriented to person, place, and time.     Motor: Weakness present.  Psychiatric:        Mood and Affect: Mood normal.        Behavior: Behavior normal.        Thought Content: Thought content normal.        Judgment: Judgment normal.    BP 127/78   Pulse 86   Ht 4\' 8"  (1.422 m)   Wt 150 lb (68 kg)   LMP 07/04/2015 (Approximate) Comment: >2 years ago  BMI 33.63 kg/m   Past Medical History:   Diagnosis Date   Deep vein thrombosis (DVT) ([facility])    Diabetes mellitus, type II ([facility])    Dislocation of hip, congenital    Fibromyalgia    Gout    Hearing loss    left ear   Hyperlipidemia    Hypertension    Hypothyroidism    Obesity    Prediabetes    Pulmonary embolism ([place]) 01/2017   Reflux    Sleep apnea    Spinal stenosis    Tendonitis    Thyroid cancer ([place])     Social History   Socioeconomic History   Marital status: Divorced    Spouse name: Not on file   Number of children: Not on file   Years of education: Not on file   Highest education level: Not on file  Occupational History   Not on file  Tobacco Use   Smoking status: Never   Smokeless tobacco: Never  Vaping Use   Vaping Use: Never used  Substance and Sexual Activity   Alcohol use: No    Alcohol/week: 0.0 standard drinks   Drug use: No   Sexual activity: Never  Other Topics  Concern   Not on file  Social History Narrative   Not on file   Social Determinants of Health   Financial Resource Strain: Not on file  Food Insecurity: Not on file  Transportation Needs: Not on file  Physical Activity: Not on file  Stress: Not on file  Social Connections: Not on file  Intimate Partner Violence: Not on file    Past Surgical History:  Procedure Laterality Date   COLONOSCOPY WITH PROPOFOL N/A 02/09/2015   Procedure: COLONOSCOPY WITH PROPOFOL;  Surgeon: [name], MD;  Location: [facility] ENDOSCOPY;  Service: Endoscopy;  Laterality: N/A;   ESOPHAGOGASTRODUODENOSCOPY N/A 02/09/2015   Procedure: ESOPHAGOGASTRODUODENOSCOPY (EGD);  Surgeon: [name], MD;  Location: [facility] ENDOSCOPY;  Service: Endoscopy;  Laterality: N/A;   HIP SURGERY     IVC FILTER INSERTION N/A 02/14/2017   Procedure: IVC Filter Insertion;  Surgeon: Katha Cabal, MD;  Location: [place] CV LAB;  Service: Cardiovascular;  Laterality: N/A;   IVC FILTER REMOVAL N/A 03/24/2018   Procedure: IVC FILTER REMOVAL;  Surgeon:  Katha Cabal, MD;  Location: [place] CV LAB;  Service: Cardiovascular;  Laterality: N/A;   LIMB SPARING RESECTION HIP W/ SADDLE JOINT REPLACEMENT Bilateral    THYROID SURGERY     TOTAL HIP REVISION      Family History  Problem Relation Age of Onset   Heart disease Mother    Hypertension Mother    Alzheimer's disease Mother    Diabetes Mother    Stroke Mother    Anxiety disorder Mother    Breast cancer Mother        at 53 years   Diabetes Father    Alcohol abuse Father    Bladder Cancer Sister    Thyroid disease Brother    Diabetes Daughter    Diabetes Maternal Aunt     No Known Allergies  CBC Latest Ref Rng & Units 12/13/2020 06/20/2020 11/24/2019  WBC 4.0 - 10.5 K/uL 13.2(H) 5.8 11.5(H)  Hemoglobin 12.0 - 15.0 g/dL 14.2 13.7 13.3  Hematocrit 36.0 - 46.0 % 42.8 42.0 41.0  Platelets 150 - 400 K/uL 220 228 236      CMP     Component Value Date/Time   NA 139 12/13/2020 1439   NA 141 10/01/2011 0721   K 3.2 (L) 12/13/2020 1439   K 3.6 10/01/2011 0721   CL 102 12/13/2020 1439   CL 104 10/01/2011 0721   CO2 24 12/13/2020 1439   CO2 28 10/01/2011 0721   GLUCOSE 160 (H) 12/13/2020 1439   GLUCOSE 111 (H) 10/01/2011 0721   BUN 10 12/13/2020 1439   BUN 10 10/01/2011 0721   CREATININE 0.55 12/13/2020 1439   CREATININE 0.51 (L) 10/01/2011 0721   CALCIUM 8.9 12/13/2020 1439   CALCIUM 9.1 10/01/2011 0721   PROT 7.2 06/20/2020 1014   PROT 7.3 10/01/2011 0721   ALBUMIN 3.9 06/20/2020 1014   ALBUMIN 3.6 10/01/2011 0721   AST 30 06/20/2020 1014   AST 17 10/01/2011 0721   ALT 33 06/20/2020 1014   ALT 28 10/01/2011 0721   ALKPHOS 54 06/20/2020 1014   ALKPHOS 79 10/01/2011 0721   BILITOT 0.5 06/20/2020 1014   BILITOT 0.3 10/01/2011 0721   GFRNONAA >60 12/13/2020 1439   GFRNONAA >60 10/01/2011 0721   GFRAA >60 11/24/2019 1001   GFRAA >60 10/01/2011 0721     No results found.     Assessment & Plan:   1. Lymphedema The patient has known  lymphedema.  We  previously have discussed the treatment course of lymphedema with the patient previously.  Lymphedema is a chronic disease which requires use of medical grade compression stockings, elevation, activity, as well as a lymphedema pump to help with control of her edema.  We discussed how fluid pills are not helpful in the treatment of lymphedema and can in fact cause more damage.  The patient is also advised to look into [place] wraps as this may be helpful with trying to control swelling.  Per the patient's wishes we will also perform a reflux study to see if there are any changes.  2. Pain in both lower extremities The patient has a multitude of issues including orthopedic issues in addition to her fibromyalgia that are likely contributing to her lower extremity pain.  We previously performed ABIs and these were essentially normal.  The patient is once again concerned due to the numbness and tingling and so we will repeat noninvasive studies trial any vascular involvement to the patient's pain.  3. Essential hypertension Continue antihypertensive medications as already ordered, these medications have been reviewed and there are no changes at this time.   4. Degeneration of intervertebral disc of lumbar region This can also be a cause of the cramping and numbness that the patient has in her bilateral lower extremities.  5. Fibromyalgia This can be a causative source of the patient's ongoing leg discomfort.   6. Chest pressure The patient is advised to present to the emergency room due to her chest pressure.  The patient notes that she will contact her cardiologist.  Current Outpatient Medications on File Prior to Visit  Medication Sig Dispense Refill   allopurinol (ZYLOPRIM) 100 MG tablet      dapagliflozin propanediol (FARXIGA) 5 MG TABS tablet Take 10 mg by mouth daily.      ELIQUIS 5 MG TABS tablet TAKE ONE (1) TABLET BY MOUTH TWO TIMES PER DAY 60 tablet 5   gabapentin (NEURONTIN) 300 MG  capsule Take 300 mg by mouth 2 (two) times daily.     glimepiride (AMARYL) 2 MG tablet Take 2 mg by mouth daily with breakfast.      lidocaine (LIDODERM) 5 % Place 1 patch onto the skin daily. For pain     lisinopril (PRINIVIL,ZESTRIL) 40 MG tablet Take 40 mg by mouth daily.     morphine (MS CONTIN) 15 MG 12 hr tablet Take 1 tablet (15 mg total) by mouth every 12 (twelve) hours. 30 tablet 0   Multiple Vitamins-Minerals (VITAMIN-MINERAL SUPPLEMENT PO) Take by mouth daily. Calcium, Magnesium, and Zinc plus Vitamin D-3     pantoprazole (PROTONIX) 40 MG tablet Take 40 mg by mouth 2 (two) times daily.   5   rosuvastatin (CRESTOR) 40 MG tablet Take 40 mg by mouth daily.      Semaglutide (OZEMPIC, 0.25 OR 0.5 MG/DOSE, [place]) Inject into the skin as directed.     VITAMIN E PO Take by mouth daily.     acetaminophen (TYLENOL) 500 MG tablet Take 1,000 mg by mouth every 6 (six) hours as needed for moderate pain or headache. (Patient not taking: Reported on 07/02/2021)     B Complex Vitamins (VITAMIN B COMPLEX PO) Take 1 tablet by mouth daily. (Patient not taking: Reported on 07/02/2021)     Biotin 10 MG TABS Take 10 mg by mouth daily.  (Patient not taking: Reported on 07/02/2021)     Cholecalciferol 25 MCG (1000 UT) tablet Take 5,000 Units by mouth  daily. (Patient not taking: Reported on 07/02/2021)     colchicine 0.6 MG tablet Take 1 tablet by mouth in the morning and at bedtime. (Patient not taking: No sig reported)     cycloSPORINE (RESTASIS) 0.05 % ophthalmic emulsion Apply to eye. (Patient not taking: Reported on 07/02/2021)     dicyclomine (BENTYL) 10 MG capsule Take 20 mg by mouth every 6 (six) hours.  (Patient not taking: Reported on 07/02/2021)     furosemide (LASIX) 40 MG tablet Take 40 mg by mouth daily. (Patient not taking: Reported on 07/02/2021)     levothyroxine (SYNTHROID, LEVOTHROID) 175 MCG tablet Take 175 mcg by mouth daily before breakfast.  (Patient not taking: Reported on 07/02/2021)  1    linaclotide (LINZESS) 145 MCG CAPS capsule Take 1 capsule by mouth daily. (Patient not taking: Reported on 07/02/2021)     potassium chloride (MICRO-K) 10 MEQ CR capsule Take 10 mEq by mouth every morning. (Patient not taking: Reported on 07/02/2021)     predniSONE (DELTASONE) 10 MG tablet  (Patient not taking: Reported on 07/02/2021)     RESTASIS 0.05 % ophthalmic emulsion  (Patient not taking: Reported on 07/02/2021)     No current facility-administered medications on file prior to visit.    There are no Patient Instructions on file for this visit. No follow-ups on file.   Kris Hartmann, NP

## 2021-07-12 ENCOUNTER — Other Ambulatory Visit: Payer: Self-pay

## 2021-07-12 ENCOUNTER — Ambulatory Visit (INDEPENDENT_AMBULATORY_CARE_PROVIDER_SITE_OTHER): Payer: Medicare Other | Admitting: Dermatology

## 2021-07-12 DIAGNOSIS — D485 Neoplasm of uncertain behavior of skin: Secondary | ICD-10-CM

## 2021-07-12 DIAGNOSIS — L821 Other seborrheic keratosis: Secondary | ICD-10-CM

## 2021-07-12 DIAGNOSIS — L578 Other skin changes due to chronic exposure to nonionizing radiation: Secondary | ICD-10-CM | POA: Diagnosis not present

## 2021-07-12 NOTE — Patient Instructions (Signed)

## 2021-07-12 NOTE — Progress Notes (Signed)
   New Patient Visit  Subjective  Katie Baker is a 60 y.o. female who presents for the following: New Patient (Initial Visit) (Patient reports spot under left eye that has grown and has become swollen. Patient reports growing over a year. Patient reports that it is painful. Patient reports a small bump at left leg. Patient reports she has thyroid cancer. She reports she has already had removal surgery. ).  The following portions of the chart were reviewed this encounter and updated as appropriate:   Tobacco  Allergies  Meds  Problems  Med Hx  Surg Hx  Fam Hx     Review of Systems:  No other skin or systemic complaints except as noted in HPI or Assessment and Plan.  Objective  Well appearing patient in no apparent distress; mood and affect are within normal limits.  A focused examination was performed including left lower eyelid and left lower leg. Relevant physical exam findings are noted in the Assessment and Plan.  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  Photo taken today Patient scheduled for surgery   Seborrheic Keratoses - Stuck-on, waxy, tan-brown papules and/or plaques  - Benign-appearing - Discussed benign etiology and prognosis. - Observe - Call for any changes  Actinic Damage - chronic, secondary to cumulative UV radiation exposure/sun exposure over time - diffuse scaly erythematous macules with underlying dyspigmentation - Recommend daily broad spectrum sunscreen SPF 30+ to sun-exposed areas, reapply every 2 hours as needed.  - Recommend staying in the shade or wearing long sleeves, sun glasses (UVA+UVB protection) and wide brim hats (4-inch brim around the entire circumference of the hat). - Call for new or changing lesions.  Return for schedule skin cancer surgery on left lower eyelid on  tuesday surgery slot or 4:30 slot .  IRuthell Rummage, CMA, am acting as scribe for Sarina Ser, MD. Documentation: I have reviewed the above documentation for accuracy and completeness, and I agree with the above.  Sarina Ser, MD

## 2021-07-17 ENCOUNTER — Encounter: Payer: Self-pay | Admitting: Dermatology

## 2021-08-05 DIAGNOSIS — I872 Venous insufficiency (chronic) (peripheral): Secondary | ICD-10-CM | POA: Insufficient documentation

## 2021-08-05 NOTE — Progress Notes (Signed)
MRN : [id number]  Katie Baker is a 60 y.o. (12/14/1960) female who presents with chief complaint of check legs.  History of Present Illness:   The patient presents to the office for evaluation of leg pain and swelling.     DVT was identified at [facility] by Duplex ultrasound in June 2018.  The initial symptoms were SOB secondary to her PE's associated with  pain and swelling in the lower extremity.   The IVC filter was removed  03/24/2018.   Patient states her swelling and leg pain have been increasing.  It is now a problem all the time.  Also having leg cramps.  Overall she feels that her pain is substantially worse and this has prompted her return to the office.   The patient has not been using compression therapy at this point because she is not able to put the stockings on.  She has not been using her Lymph pump stating it isn't working properly and is very worn.  It is 61 years old she states.  She is now spending the majority of her time in a wheelchair because of her disability.  She continues to use a hospital bed and elevates her feet at night.   No SOB or pleuritic chest pains.  No cough or hemoptysis.   No blood per rectum or blood in any sputum.  No excessive bruising per the patient.  ABIs obtained today demonstrate normal waveforms all of them are triphasic with normal TBI's.  Patient would not allow for a cuff to be placed on the ankle.  Venous duplex demonstrates her deep veins are all patent and compressible bilaterally.  No evidence of reflux is identified.  No outpatient medications have been marked as taking for the 08/06/21 encounter (Appointment) with Delana Meyer, Dolores Lory, MD.    Past Medical History:  Diagnosis Date   Deep vein thrombosis (DVT) ([place])    Diabetes mellitus, type II ([place])    Dislocation of hip, congenital    Fibromyalgia    Gout    Hearing loss    left ear   Hyperlipidemia    Hypertension    Hypothyroidism    Obesity    Prediabetes     Pulmonary embolism ([place]) 01/2017   Reflux    Sleep apnea    Spinal stenosis    Tendonitis    Thyroid cancer [facility])     Past Surgical History:  Procedure Laterality Date   COLONOSCOPY WITH PROPOFOL N/A 02/09/2015   Procedure: COLONOSCOPY WITH PROPOFOL;  Surgeon: [name], MD;  Location: [facility] ENDOSCOPY;  Service: Endoscopy;  Laterality: N/A;   ESOPHAGOGASTRODUODENOSCOPY N/A 02/09/2015   Procedure: ESOPHAGOGASTRODUODENOSCOPY (EGD);  Surgeon: [name], MD;  Location: [facility] ENDOSCOPY;  Service: Endoscopy;  Laterality: N/A;   HIP SURGERY     IVC FILTER INSERTION N/A 02/14/2017   Procedure: IVC Filter Insertion;  Surgeon: Katha Cabal, MD;  Location: [place] CV LAB;  Service: Cardiovascular;  Laterality: N/A;   IVC FILTER REMOVAL N/A 03/24/2018   Procedure: IVC FILTER REMOVAL;  Surgeon: Katha Cabal, MD;  Location: [place] CV LAB;  Service: Cardiovascular;  Laterality: N/A;   LIMB SPARING RESECTION HIP W/ SADDLE JOINT REPLACEMENT Bilateral    THYROID SURGERY     TOTAL HIP REVISION      Social History Social History   Tobacco Use   Smoking status: Never   Smokeless tobacco: Never  Vaping Use   Vaping Use: Never used  Substance Use Topics   Alcohol use: No    Alcohol/week: 0.0 standard drinks   Drug use: No    Family History Family History  Problem Relation Age of Onset   Heart disease Mother    Hypertension Mother    Alzheimer's disease Mother    Diabetes Mother    Stroke Mother    Anxiety disorder Mother    Breast cancer Mother        at 6 years   Diabetes Father    Alcohol abuse Father    Bladder Cancer Sister    Thyroid disease Brother    Diabetes Daughter    Diabetes Maternal Aunt     No Known Allergies   REVIEW OF SYSTEMS (Negative unless checked)  Constitutional: [] Weight loss  [] Fever  [] Chills Cardiac: [] Chest pain   [] Chest pressure   [] Palpitations   [] Shortness of breath when laying flat   [] Shortness of breath  with exertion. Vascular:  [] Pain in legs with walking   [] Pain in legs at rest  [] History of DVT   [] Phlebitis   [x] Swelling in legs   [] Varicose veins   [] Non-healing ulcers Pulmonary:   [] Uses home oxygen   [] Productive cough   [] Hemoptysis   [] Wheeze  [] COPD   [] Asthma Neurologic:  [] Dizziness   [] Seizures   [] History of stroke   [] History of TIA  [] Aphasia   [] Vissual changes   [] Weakness or numbness in arm   [x] Weakness or numbness in leg Musculoskeletal:   [] Joint swelling   [] Joint pain   [] Low back pain Hematologic:  [] Easy bruising  [] Easy bleeding   [] Hypercoagulable state   [] Anemic Gastrointestinal:  [] Diarrhea   [] Vomiting  [] Gastroesophageal reflux/heartburn   [] Difficulty swallowing. Genitourinary:  [] Chronic kidney disease   [] Difficult urination  [] Frequent urination   [] Blood in urine Skin:  [] Rashes   [] Ulcers  Psychological:  [] History of anxiety   []  History of major depression.  Physical Examination  There were no vitals filed for this visit. There is no height or weight on file to calculate BMI. Gen: WD/WN, NAD Head: [place]/AT, No temporalis wasting.  Ear/Nose/Throat: Hearing grossly intact, nares w/o erythema or drainage, pinna without lesions Eyes: PER, EOMI, sclera nonicteric.  Neck: Supple, no gross masses.  No JVD.  Pulmonary:  Good air movement, no audible wheezing, no use of accessory muscles.  Cardiac: RRR, precordium not hyperdynamic. Vascular:  scattered varicosities present bilaterally.  Severe venous stasis changes to the legs bilaterally.  4+ soft pitting edema.  No open wounds or sores Vessel Right Left  Radial Palpable Palpable  Gastrointestinal: soft, non-distended. No guarding/no peritoneal signs.  Musculoskeletal: M/S 5/5 throughout.  No deformity.  Neurologic: CN 2-12 intact. Pain and light touch intact in extremities.  Symmetrical.  Speech is fluent. Motor exam as listed above. Psychiatric: Judgment intact, Mood & affect appropriate for pt's clinical  situation. Dermatologic: Severe venous rashes no ulcers noted.  No changes consistent with cellulitis. Lymph : + lichenification + skin changes of chronic lymphedema.  CBC Lab Results  Component Value Date   WBC 13.2 (H) 12/13/2020   HGB 14.2 12/13/2020   HCT 42.8 12/13/2020   MCV 89.0 12/13/2020   PLT 220 12/13/2020    BMET    Component Value Date/Time   NA 139 12/13/2020 1439   NA 141 10/01/2011 0721   K 3.2 (L) 12/13/2020 1439   K 3.6 10/01/2011 0721   CL 102 12/13/2020 1439   CL 104 10/01/2011 0721   CO2  24 12/13/2020 1439   CO2 28 10/01/2011 0721   GLUCOSE 160 (H) 12/13/2020 1439   GLUCOSE 111 (H) 10/01/2011 0721   BUN 10 12/13/2020 1439   BUN 10 10/01/2011 0721   CREATININE 0.55 12/13/2020 1439   CREATININE 0.51 (L) 10/01/2011 0721   CALCIUM 8.9 12/13/2020 1439   CALCIUM 9.1 10/01/2011 0721   GFRNONAA >60 12/13/2020 1439   GFRNONAA >60 10/01/2011 0721   GFRAA >60 11/24/2019 1001   GFRAA >60 10/01/2011 0721   CrCl cannot be calculated (Patient's most recent lab result is older than the maximum 21 days allowed.).  COAG Lab Results  Component Value Date   INR 0.96 02/12/2017    Radiology No results found.   Assessment/Plan 1. Pain in both lower extremities Recommend:  I do not find evidence of Vascular pathology that would explain the patient's symptoms  The patient has atypical pain symptoms for vascular disease  I do not find evidence of Vascular pathology that would explain the patient's symptoms and I suspect the patient is c/o pseudoclaudication or neuropathic leg pain.  Patient should have an evaluation of his LS spine which I defer to the primary service.  Noninvasive studies including venous ultrasound of the legs and ABI's do not identify vascular problems  The patient should continue walking and begin a more formal exercise program. The patient should continue his antiplatelet therapy and aggressive treatment of the lipid abnormalities. The  patient should continue wearing graduated compression to control her mild edema.  Further work-up of her lower extremity pain is deferred to the primary service      2. Lymphedema Recommend:  No surgery or intervention at this point in time.    I have reviewed my previous discussion with the patient regarding swelling and why it causes symptoms.  Patient needs to wear graduated compression class 1 (20-30 mmHg) on a daily basis.  She resists this but I have shown her compression wraps which she would be capable of doing.  The patient will  beginning wearing the stockings first thing in the morning and removing them in the evening. The patient is instructed specifically not to sleep in the stockings.    In addition, behavioral modification including several periods of elevation of the lower extremities during the day will be continued.  This was reviewed with the patient during the initial visit.  The patient will also continue routine exercise, especially walking on a daily basis as was discussed during the initial visit.    Despite conservative treatments including graduated compression therapy class 1 and behavioral modification including exercise and elevation the patient  has not obtained adequate control of the lymphedema.  The patient still has stage 3 lymphedema and therefore, I believe that a new lymph pump is indicated to improve the control of the patient's lymphedema.  Additionally, a lymph pump is warranted because it will reduce the risk of cellulitis and ulceration in the future.  Patient should follow-up in six months    3. Chronic venous insufficiency See #1 & 2  4. Primary hypertension Continue antihypertensive medications as already ordered, these medications have been reviewed and there are no changes at this time.   5. Type 2 diabetes mellitus without complication, without long-term current use of insulin (HCC) Continue hypoglycemic medications as already ordered, these  medications have been reviewed and there are no changes at this time.  Hgb A1C to be monitored as already arranged by primary service   6. Mixed hyperlipidemia Continue statin  as ordered and reviewed, no changes at this time     Hortencia Pilar, MD  08/05/2021 3:43 PM

## 2021-08-06 ENCOUNTER — Telehealth (INDEPENDENT_AMBULATORY_CARE_PROVIDER_SITE_OTHER): Payer: Self-pay | Admitting: Vascular Surgery

## 2021-08-06 ENCOUNTER — Ambulatory Visit (INDEPENDENT_AMBULATORY_CARE_PROVIDER_SITE_OTHER): Payer: Medicare Other

## 2021-08-06 ENCOUNTER — Other Ambulatory Visit: Payer: Self-pay

## 2021-08-06 ENCOUNTER — Ambulatory Visit (INDEPENDENT_AMBULATORY_CARE_PROVIDER_SITE_OTHER): Payer: Medicare Other | Admitting: Vascular Surgery

## 2021-08-06 ENCOUNTER — Other Ambulatory Visit (INDEPENDENT_AMBULATORY_CARE_PROVIDER_SITE_OTHER): Payer: Self-pay | Admitting: Nurse Practitioner

## 2021-08-06 VITALS — BP 120/78 | HR 76 | Ht <= 58 in | Wt 150.0 lb

## 2021-08-06 DIAGNOSIS — I890 Lymphedema, not elsewhere classified: Secondary | ICD-10-CM

## 2021-08-06 DIAGNOSIS — I872 Venous insufficiency (chronic) (peripheral): Secondary | ICD-10-CM | POA: Diagnosis not present

## 2021-08-06 DIAGNOSIS — E782 Mixed hyperlipidemia: Secondary | ICD-10-CM

## 2021-08-06 DIAGNOSIS — M79604 Pain in right leg: Secondary | ICD-10-CM

## 2021-08-06 DIAGNOSIS — M79605 Pain in left leg: Secondary | ICD-10-CM | POA: Diagnosis not present

## 2021-08-06 DIAGNOSIS — I10 Essential (primary) hypertension: Secondary | ICD-10-CM | POA: Diagnosis not present

## 2021-08-06 DIAGNOSIS — E119 Type 2 diabetes mellitus without complications: Secondary | ICD-10-CM

## 2021-08-06 NOTE — Telephone Encounter (Signed)
LVM for pt TCB and schedule appt  6 months no studies. See gs/fb

## 2021-08-12 ENCOUNTER — Encounter (INDEPENDENT_AMBULATORY_CARE_PROVIDER_SITE_OTHER): Payer: Self-pay | Admitting: Vascular Surgery

## 2021-08-21 ENCOUNTER — Encounter: Payer: Medicare Other | Admitting: Dermatology

## 2021-10-10 ENCOUNTER — Telehealth: Payer: Self-pay

## 2021-10-10 NOTE — Telephone Encounter (Signed)
Patient called to cancel surgery appointment for next week. She is currently on a blood thinner due to blood clots in her lungs and legs. She is too scared to have surgery done at this time.

## 2021-10-16 ENCOUNTER — Encounter: Payer: Medicare Other | Admitting: Dermatology

## 2021-12-07 ENCOUNTER — Other Ambulatory Visit: Payer: Self-pay | Admitting: Internal Medicine

## 2021-12-07 ENCOUNTER — Other Ambulatory Visit: Payer: Self-pay | Admitting: Nurse Practitioner

## 2021-12-07 DIAGNOSIS — Z1231 Encounter for screening mammogram for malignant neoplasm of breast: Secondary | ICD-10-CM

## 2021-12-11 ENCOUNTER — Ambulatory Visit: Payer: Medicare Other | Admitting: Anesthesiology

## 2021-12-11 ENCOUNTER — Ambulatory Visit
Admission: RE | Admit: 2021-12-11 | Discharge: 2021-12-11 | Disposition: A | Discharge status: Home / Self Care | Payer: Medicare Other | Attending: Gastroenterology | Admitting: Gastroenterology

## 2021-12-11 ENCOUNTER — Encounter
Admission: RE | Disposition: A | Discharge status: Home / Self Care | Payer: Self-pay | Source: Home / Self Care | Attending: Gastroenterology

## 2021-12-11 ENCOUNTER — Encounter: Payer: Self-pay | Admitting: *Deleted

## 2021-12-11 DIAGNOSIS — E119 Type 2 diabetes mellitus without complications: Secondary | ICD-10-CM | POA: Diagnosis not present

## 2021-12-11 DIAGNOSIS — Z86711 Personal history of pulmonary embolism: Secondary | ICD-10-CM | POA: Diagnosis not present

## 2021-12-11 DIAGNOSIS — Z7952 Long term (current) use of systemic steroids: Secondary | ICD-10-CM | POA: Insufficient documentation

## 2021-12-11 DIAGNOSIS — I10 Essential (primary) hypertension: Secondary | ICD-10-CM | POA: Diagnosis not present

## 2021-12-11 DIAGNOSIS — Z86718 Personal history of other venous thrombosis and embolism: Secondary | ICD-10-CM | POA: Diagnosis not present

## 2021-12-11 DIAGNOSIS — K449 Diaphragmatic hernia without obstruction or gangrene: Secondary | ICD-10-CM | POA: Insufficient documentation

## 2021-12-11 DIAGNOSIS — Z7984 Long term (current) use of oral hypoglycemic drugs: Secondary | ICD-10-CM | POA: Insufficient documentation

## 2021-12-11 DIAGNOSIS — E669 Obesity, unspecified: Secondary | ICD-10-CM | POA: Diagnosis not present

## 2021-12-11 DIAGNOSIS — E039 Hypothyroidism, unspecified: Secondary | ICD-10-CM | POA: Insufficient documentation

## 2021-12-11 DIAGNOSIS — Z6835 Body mass index (BMI) 35.0-35.9, adult: Secondary | ICD-10-CM | POA: Diagnosis not present

## 2021-12-11 DIAGNOSIS — K317 Polyp of stomach and duodenum: Secondary | ICD-10-CM | POA: Insufficient documentation

## 2021-12-11 DIAGNOSIS — Z79899 Other long term (current) drug therapy: Secondary | ICD-10-CM | POA: Diagnosis not present

## 2021-12-11 DIAGNOSIS — Z1211 Encounter for screening for malignant neoplasm of colon: Secondary | ICD-10-CM | POA: Insufficient documentation

## 2021-12-11 DIAGNOSIS — R1310 Dysphagia, unspecified: Secondary | ICD-10-CM | POA: Insufficient documentation

## 2021-12-11 DIAGNOSIS — Z79890 Hormone replacement therapy (postmenopausal): Secondary | ICD-10-CM | POA: Diagnosis not present

## 2021-12-11 DIAGNOSIS — Z86010 Personal history of colonic polyps: Secondary | ICD-10-CM | POA: Insufficient documentation

## 2021-12-11 DIAGNOSIS — E785 Hyperlipidemia, unspecified: Secondary | ICD-10-CM | POA: Insufficient documentation

## 2021-12-11 DIAGNOSIS — Z7901 Long term (current) use of anticoagulants: Secondary | ICD-10-CM | POA: Diagnosis not present

## 2021-12-11 HISTORY — PX: ESOPHAGOGASTRODUODENOSCOPY (EGD) WITH PROPOFOL: SHX5813

## 2021-12-11 HISTORY — PX: COLONOSCOPY WITH PROPOFOL: SHX5780

## 2021-12-11 LAB — GLUCOSE, CAPILLARY: Glucose-Capillary: 147 mg/dL — ABNORMAL HIGH (ref 70–99)

## 2021-12-11 SURGERY — COLONOSCOPY WITH PROPOFOL
Anesthesia: General

## 2021-12-11 MED ORDER — SODIUM CHLORIDE 0.9 % IV SOLN
INTRAVENOUS | Status: DC
  Administered 2021-12-11: 1000 mL via INTRAVENOUS

## 2021-12-11 MED ORDER — PROPOFOL 500 MG/50ML IV EMUL
INTRAVENOUS | Status: DC | PRN
  Administered 2021-12-11: 120 ug/kg/min via INTRAVENOUS

## 2021-12-11 MED ORDER — LIDOCAINE HCL (CARDIAC) PF 100 MG/5ML IV SOSY
PREFILLED_SYRINGE | INTRAVENOUS | Status: DC | PRN
  Administered 2021-12-11: 50 mg via INTRAVENOUS

## 2021-12-11 MED ORDER — PROPOFOL 10 MG/ML IV BOLUS
INTRAVENOUS | Status: DC | PRN
  Administered 2021-12-11: 60 mg via INTRAVENOUS
  Administered 2021-12-11: 30 mg via INTRAVENOUS

## 2021-12-11 NOTE — Transfer of Care (Signed)
Immediate Anesthesia Transfer of Care Note ? ?Patient: Katie Baker ? ?Procedure(s) Performed: COLONOSCOPY WITH PROPOFOL ?ESOPHAGOGASTRODUODENOSCOPY (EGD) WITH PROPOFOL ? ?Patient Location: PACU and Endoscopy Unit ? ?Anesthesia Type:General ? ?Level of Consciousness: drowsy and patient cooperative ? ?Airway & Oxygen Therapy: Patient Spontanous Breathing ? ?Post-op Assessment: Report given to RN and Post -op Vital signs reviewed and stable ? ?Post vital signs: Reviewed and stable ? ?Last Vitals:  ?Vitals Value Taken Time  ?BP 103/44 12/11/21 1047  ?Temp 36.1 ?C 12/11/21 1047  ?Pulse 79 12/11/21 1049  ?Resp 21 12/11/21 1049  ?SpO2 98 % 12/11/21 1049  ?Vitals shown include unvalidated device data. ? ?Last Pain:  ?Vitals:  ? 12/11/21 1047  ?TempSrc: Temporal  ?PainSc: Asleep  ?   ? ?  ? ?Complications: No notable events documented. ?

## 2021-12-11 NOTE — Op Note (Signed)
[facility] ?Gastroenterology ?Patient Name: Katie Baker ?Procedure Date: 12/11/2021 10:25 AM ?MRN: [id number] ?Account #: [id number] ?Date of Birth: 1960-09-15 ?Admit Type: Outpatient ?Age: 61 ?Room: [facility] ENDO ROOM 1 ?Gender: Female ?Note Status: Finalized ?Instrument Name: Colonscope [id number] ?Procedure:             Colonoscopy ?Indications:           Surveillance: Personal history of adenomatous polyps  ?                       on last colonoscopy > 5 years ago ?Providers:             Andrey Farmer MD, MD ?Referring MD:          Perrin Maltese, MD (Referring MD) ?Medicines:             Monitored Anesthesia Care ?Complications:         No immediate complications. ?Procedure:             Pre-Anesthesia Assessment: ?                       - Prior to the procedure, a History and Physical was  ?                       performed, and patient medications and allergies were  ?                       reviewed. The patient is competent. The risks and  ?                       benefits of the procedure and the sedation options and  ?                       risks were discussed with the patient. All questions  ?                       were answered and informed consent was obtained.  ?                       Patient identification and proposed procedure were  ?                       verified by the physician, the nurse, the  ?                       anesthesiologist, the anesthetist and the technician  ?                       in the endoscopy suite. Mental Status Examination:  ?                       alert and oriented. Airway Examination: normal  ?                       oropharyngeal airway and neck mobility. Respiratory  ?                       Examination: clear to auscultation. CV Examination:  ?  normal. Prophylactic Antibiotics: The patient does not  ?                       require prophylactic antibiotics. Prior  ?                       Anticoagulants: The patient has taken Eliquis  ?                        (apixaban), last dose was 2 days prior to procedure.  ?                       ASA Grade Assessment: III - A patient with severe  ?                       systemic disease. After reviewing the risks and  ?                       benefits, the patient was deemed in satisfactory  ?                       condition to undergo the procedure. The anesthesia  ?                       plan was to use monitored anesthesia care (MAC).  ?                       Immediately prior to administration of medications,  ?                       the patient was re-assessed for adequacy to receive  ?                       sedatives. The heart rate, respiratory rate, oxygen  ?                       saturations, blood pressure, adequacy of pulmonary  ?                       ventilation, and response to care were monitored  ?                       throughout the procedure. The physical status of the  ?                       patient was re-assessed after the procedure. ?                       After obtaining informed consent, the colonoscope was  ?                       passed under direct vision. Throughout the procedure,  ?                       the patient's blood pressure, pulse, and oxygen  ?                       saturations were monitored continuously. The  ?  Colonoscope was introduced through the anus with the  ?                       intention of advancing to the cecum. The scope was  ?                       advanced to the rectum before the procedure was  ?                       aborted. Medications were given. The colonoscopy was  ?                       aborted due to poor bowel prep. The patient tolerated  ?                       the procedure well. The quality of the bowel  ?                       preparation was poor. ?Findings: ?     The perianal and digital rectal examinations were normal. ?     A large amount of stool was found in the rectum, precluding  ?      visualization. ?Impression:            - The procedure was aborted due to poor bowel prep. ?                       - Preparation of the colon was poor. ?                       - Stool in the rectum. ?                       - No specimens collected. ?Recommendation:        - Discharge patient to home. ?                       - Resume previous diet. ?                       - Resume Eliquis (apixaban) at prior dose today. ?                       - Repeat colonoscopy at the next available appointment  ?                       because the bowel preparation was suboptimal. ?                       - Return to referring physician as previously  ?                       scheduled. ?Procedure Code(s):     --- Professional --- ?                       G0105, 53, Colorectal cancer screening; colonoscopy on  ?                       individual at high risk ?Diagnosis Code(s):     ---  Professional --- ?                       Z86.010, Personal history of colonic polyps ?CPT copyright 2019 American Medical Association. All rights reserved. ?The codes documented in this report are preliminary and upon coder review may  ?be revised to meet current compliance requirements. ?Andrey Farmer MD, MD ?12/11/2021 10:49:29 AM ?Number of Addenda: 0 ?Note Initiated On: 12/11/2021 10:25 AM ?Total Procedure Duration: 0 hours 0 minutes 29 seconds  ?Estimated Blood Loss:  Estimated blood loss: none. ?     [facility] ?

## 2021-12-11 NOTE — Anesthesia Postprocedure Evaluation (Signed)
Anesthesia Post Note ? ?Patient: Katie Baker ? ?Procedure(s) Performed: COLONOSCOPY WITH PROPOFOL ?ESOPHAGOGASTRODUODENOSCOPY (EGD) WITH PROPOFOL ? ?Patient location during evaluation: Endoscopy ?Anesthesia Type: General ?Level of consciousness: awake and alert ?Pain management: pain level controlled ?Vital Signs Assessment: post-procedure vital signs reviewed and stable ?Respiratory status: spontaneous breathing, nonlabored ventilation and respiratory function stable ?Cardiovascular status: blood pressure returned to baseline and stable ?Postop Assessment: no apparent nausea or vomiting ?Anesthetic complications: no ? ? ?No notable events documented. ? ? ?Last Vitals:  ?Vitals:  ? 12/11/21 1047 12/11/21 1057  ?BP: (!) 103/44 109/82  ?Pulse:    ?Resp: 14   ?Temp: (!) 36.1 ?C   ?SpO2:    ?  ?Last Pain:  ?Vitals:  ? 12/11/21 1107  ?TempSrc:   ?PainSc: 0-No pain  ? ? ?  ?  ?  ?  ?  ?  ? ?[place] ? ? ? ? ?

## 2021-12-11 NOTE — Anesthesia Preprocedure Evaluation (Addendum)
Anesthesia Evaluation  ?Patient identified by MRN, date of birth, ID band ?Patient awake ? ? ? ?Reviewed: ?Allergy & Precautions, NPO status , Patient's Chart, lab work & pertinent test results ? ?Airway ?Mallampati: I ? ?TM Distance: >3 FB ?Neck ROM: full ? ? ? Dental ? ?(+) Missing ?  ?Pulmonary ?shortness of breath and with exertion, sleep apnea and Continuous Positive Airway Pressure Ventilation ,  ?  ?Pulmonary exam normal ? ? ? ? ? ? ? Cardiovascular ?Exercise Tolerance: Poor ?METS: < 3 Mets hypertension, + DOE  ?Normal cardiovascular exam ? ?PE 2018 ?  ?Neuro/Psych ?negative neurological ROS ? negative psych ROS  ? GI/Hepatic ?Neg liver ROS, Dysphagia ?  ?Endo/Other  ?diabetesHypothyroidism  ? Renal/GU ?negative Renal ROS  ?negative genitourinary ?  ?Musculoskeletal ? ?(+) Fibromyalgia - ? Abdominal ?(+) + obese,   ?Peds ? Hematology ?negative hematology ROS ?(+)   ?Anesthesia Other Findings ?Past Medical History: ?No date: Deep vein thrombosis (DVT) ([place]) ?No date: Diabetes mellitus, type II ([place]) ?No date: Dislocation of hip, congenital ?No date: Fibromyalgia ?No date: Gout ?No date: Hearing loss ?    Comment:  left ear ?No date: Hyperlipidemia ?No date: Hypertension ?No date: Hypothyroidism ?No date: Obesity ?No date: Prediabetes ?01/2017: Pulmonary embolism ([place]) ?No date: Reflux ?No date: Sleep apnea ?No date: Spinal stenosis ?No date: Tendonitis ?No date: Thyroid cancer ([place]) ? ?Past Surgical History: ?02/09/2015: COLONOSCOPY WITH PROPOFOL; N/A ?    Comment:  Procedure: COLONOSCOPY WITH PROPOFOL;  Surgeon: Rodman Key  ?             Elmyra Ricks, MD;  Location: [facility] ENDOSCOPY;  Service:  ?             Endoscopy;  Laterality: N/A; ?02/09/2015: ESOPHAGOGASTRODUODENOSCOPY; N/A ?    Comment:  Procedure: ESOPHAGOGASTRODUODENOSCOPY (EGD);  Surgeon:  ?             [name], MD;  Location: [facility] ENDOSCOPY;   ?             Service: Endoscopy;  Laterality: N/A; ?No date: HIP  SURGERY ?02/14/2017: IVC FILTER INSERTION; N/A ?    Comment:  Procedure: IVC Filter Insertion;  Surgeon: Delana Meyer,  ?             Dolores Lory, MD;  Location: [place] CV LAB;  Service: ?             Cardiovascular;  Laterality: N/A; ?03/24/2018: IVC FILTER REMOVAL; N/A ?    Comment:  Procedure: IVC FILTER REMOVAL;  Surgeon: Delana Meyer,  ?             Dolores Lory, MD;  Location: [place] CV LAB;  Service: ?             Cardiovascular;  Laterality: N/A; ?No date: LIMB SPARING RESECTION HIP W/ SADDLE JOINT REPLACEMENT;  ?Bilateral ?No date: THYROID SURGERY ?No date: TOTAL HIP REVISION ? ? ? ? Reproductive/Obstetrics ?negative OB ROS ? ?  ? ? ? ? ? ? ? ? ? ? ? ? ? ?  ?  ? ? ? ? ? ? ? ?Anesthesia Physical ?Anesthesia Plan ? ?ASA: 3 ? ?Anesthesia Plan: General  ? ?Post-op Pain Management:   ? ?Induction: Intravenous ? ?PONV Risk Score and Plan: Propofol infusion and TIVA ? ?Airway Management Planned: Natural Airway ? ?Additional Equipment:  ? ?Intra-op Plan:  ? ?Post-operative Plan:  ? ?Informed Consent: I have reviewed the patients History and Physical, chart, labs and discussed  the procedure including the risks, benefits and alternatives for the proposed anesthesia with the patient or authorized representative who has indicated his/her understanding and acceptance.  ? ? ? ?Dental advisory given ? ?Plan Discussed with: Anesthesiologist, CRNA and Surgeon ? ?Anesthesia Plan Comments:   ? ? ? ? ? ?Anesthesia Quick Evaluation ? ?

## 2021-12-11 NOTE — Interval H&P Note (Signed)
History and Physical Interval Note: ? ?12/11/2021 ?10:23 AM ? ?Katie Baker  has presented today for surgery, with the diagnosis of Dysphagia ?PH Colonic Polyps.  The various methods of treatment have been discussed with the patient and family. After consideration of risks, benefits and other options for treatment, the patient has consented to  Procedure(s): ?COLONOSCOPY WITH PROPOFOL (N/A) ?ESOPHAGOGASTRODUODENOSCOPY (EGD) WITH PROPOFOL (N/A) as a surgical intervention.  The patient's history has been reviewed, patient examined, no change in status, stable for surgery.  I have reviewed the patient's chart and labs.  Questions were answered to the patient's satisfaction.   ? ? ?Katie Baker ? ?Ok to proceed with EGD/Colonoscopy ?

## 2021-12-11 NOTE — H&P (Signed)
Outpatient short stay form Pre-procedure ?12/11/2021  ?Katie Rubenstein, MD ? ?Primary Physician: Perrin Maltese, MD ? ?Reason for visit:  Dysphagia/Personal history of polyps ? ?History of present illness:   ? ?61 y/o lady with chronic GI issues likely from opioid use here for EGD for dysphagia and colonoscopy for history of adenomatous polyps on colonoscopy in 2016. Patient states she took her DOAC > 48 hours ago. No first degree relatives with GI malignancies. No significant abdominal surgeries. ? ? ? ?Current Facility-Administered Medications:  ?  0.9 %  sodium chloride infusion, , Intravenous, Continuous, Georganne Siple, Hilton Cork, MD ? ?Medications Prior to Admission  ?Medication Sig Dispense Refill Last Dose  ? acetaminophen (TYLENOL) 500 MG tablet Take 1,000 mg by mouth every 6 (six) hours as needed for moderate pain or headache.     ? allopurinol (ZYLOPRIM) 100 MG tablet      ? B Complex Vitamins (VITAMIN B COMPLEX PO) Take 1 tablet by mouth daily.     ? Biotin 10 MG TABS Take 10 mg by mouth daily.     ? Cholecalciferol 25 MCG (1000 UT) tablet Take 5,000 Units by mouth daily.     ? colchicine 0.6 MG tablet Take 1 tablet by mouth in the morning and at bedtime.     ? cycloSPORINE (RESTASIS) 0.05 % ophthalmic emulsion Apply to eye.     ? dapagliflozin propanediol (FARXIGA) 5 MG TABS tablet Take 10 mg by mouth daily.      ? dicyclomine (BENTYL) 10 MG capsule Take 20 mg by mouth every 6 (six) hours.     ? ELIQUIS 5 MG TABS tablet TAKE ONE (1) TABLET BY MOUTH TWO TIMES PER DAY 60 tablet 5 12/08/2021  ? furosemide (LASIX) 40 MG tablet Take 40 mg by mouth daily.     ? gabapentin (NEURONTIN) 300 MG capsule Take 300 mg by mouth 2 (two) times daily.     ? glimepiride (AMARYL) 2 MG tablet Take 2 mg by mouth daily with breakfast.      ? hydroxychloroquine (PLAQUENIL) 200 MG tablet Take 200 mg by mouth 2 (two) times daily.     ? levothyroxine (SYNTHROID, LEVOTHROID) 175 MCG tablet Take 175 mcg by mouth daily before breakfast.   1   ? lidocaine (LIDODERM) 5 % Place 1 patch onto the skin daily. For pain     ? linaclotide (LINZESS) 145 MCG CAPS capsule Take 1 capsule by mouth daily.     ? lisinopril (PRINIVIL,ZESTRIL) 40 MG tablet Take 40 mg by mouth daily.     ? morphine (MS CONTIN) 15 MG 12 hr tablet Take 1 tablet (15 mg total) by mouth every 12 (twelve) hours. 30 tablet 0   ? Multiple Vitamins-Minerals (VITAMIN-MINERAL SUPPLEMENT PO) Take by mouth daily. Calcium, Magnesium, and Zinc plus Vitamin D-3     ? pantoprazole (PROTONIX) 40 MG tablet Take 40 mg by mouth 2 (two) times daily.   5   ? potassium chloride (MICRO-K) 10 MEQ CR capsule Take 10 mEq by mouth every morning.     ? predniSONE (DELTASONE) 10 MG tablet      ? RESTASIS 0.05 % ophthalmic emulsion      ? rosuvastatin (CRESTOR) 40 MG tablet Take 40 mg by mouth daily.      ? Semaglutide (OZEMPIC, 0.25 OR 0.5 MG/DOSE, Home) Inject into the skin as directed.     ? VITAMIN E PO Take by mouth daily.     ? ? ? ?  No Known Allergies ? ? ?Past Medical History:  ?Diagnosis Date  ? Deep vein thrombosis (DVT) (HCC)   ? Diabetes mellitus, type II ([place])   ? Dislocation of hip, congenital   ? Fibromyalgia   ? Gout   ? 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 Hyperlipidemia   ? Hypertension   ? Hypothyroidism   ? Obesity   ? Prediabetes   ? Pulmonary embolism ([place]) 01/2017  ? Reflux   ? Sleep apnea   ? Spinal stenosis   ? Tendonitis   ? Thyroid cancer ([place])   ? ? ?Review of systems:  Otherwise negative.  ? ? ?Physical Exam ? ?Gen: Alert, oriented. Appears stated age.  ?HEENT: PERRLA. ?Lungs: No respiratory distress ?CV: RRR ?Abd: soft, benign, no masses ?Ext: No edema ? ? ? ?Planned procedures: Proceed with EGD/colonoscopy. The patient understands the nature of the planned procedure, indications, risks, alternatives and potential complications including but not limited to bleeding, infection, perforation, damage to internal organs and possible oversedation/side effects from anesthesia. The patient agrees and gives  consent to proceed.  ?Please refer to procedure notes for findings, recommendations and patient disposition/instructions.  ? ? ? ?Katie Rubenstein, MD ?[facility] ? ? ? ?  ? ?

## 2021-12-11 NOTE — Op Note (Signed)
[facility] ?Gastroenterology ?Patient Name: Katie Baker ?Procedure Date: 12/11/2021 10:26 AM ?MRN: [id number] ?Account #: [id number] ?Date of Birth: 06-May-1961 ?Admit Type: Outpatient ?Age: 61 ?Room: [facility] ENDO ROOM 1 ?Gender: Female ?Note Status: Finalized ?Instrument Name: Upper Endoscope [id number] ?Procedure:             Upper GI endoscopy ?Indications:           Dysphagia ?Providers:             Andrey Farmer MD, MD ?Referring MD:          Perrin Maltese, MD (Referring MD) ?Medicines:             Monitored Anesthesia Care ?Complications:         No immediate complications. Estimated blood loss:  ?                       Minimal. ?Procedure:             Pre-Anesthesia Assessment: ?                       - Prior to the procedure, a History and Physical was  ?                       performed, and patient medications and allergies were  ?                       reviewed. The patient is competent. The risks and  ?                       benefits of the procedure and the sedation options and  ?                       risks were discussed with the patient. All questions  ?                       were answered and informed consent was obtained.  ?                       Patient identification and proposed procedure were  ?                       verified by the physician, the nurse, the  ?                       anesthesiologist, the anesthetist and the technician  ?                       in the endoscopy suite. Mental Status Examination:  ?                       alert and oriented. Airway Examination: normal  ?                       oropharyngeal airway and neck mobility. Respiratory  ?                       Examination: clear to auscultation. CV Examination:  ?  normal. Prophylactic Antibiotics: The patient does not  ?                       require prophylactic antibiotics. Prior  ?                       Anticoagulants: The patient has taken Eliquis  ?                       (apixaban),  last dose was 2 days prior to procedure.  ?                       ASA Grade Assessment: III - A patient with severe  ?                       systemic disease. After reviewing the risks and  ?                       benefits, the patient was deemed in satisfactory  ?                       condition to undergo the procedure. The anesthesia  ?                       plan was to use monitored anesthesia care (MAC).  ?                       Immediately prior to administration of medications,  ?                       the patient was re-assessed for adequacy to receive  ?                       sedatives. The heart rate, respiratory rate, oxygen  ?                       saturations, blood pressure, adequacy of pulmonary  ?                       ventilation, and response to care were monitored  ?                       throughout the procedure. The physical status of the  ?                       patient was re-assessed after the procedure. ?                       After obtaining informed consent, the endoscope was  ?                       passed under direct vision. Throughout the procedure,  ?                       the patient's blood pressure, pulse, and oxygen  ?                       saturations were monitored continuously. The Endoscope  ?  was introduced through the mouth, and advanced to the  ?                       second part of duodenum. The upper GI endoscopy was  ?                       accomplished without difficulty. The patient tolerated  ?                       the procedure well. ?Findings: ?     The examined esophagus was normal. Biopsies were taken with a cold  ?     forceps for histology. Estimated blood loss was minimal. ?     A small hiatal hernia was present. ?     Multiple 2 to 7 mm sessile fundic gland polyps with no bleeding and no  ?     stigmata of recent bleeding were found in the gastric fundus and in the  ?     gastric body. One polyp was removed with a cold biopsy forceps.  ?      Resection and retrieval were complete. Estimated blood loss was minimal. ?     The examined duodenum was normal. ?Impression:            - Normal esophagus. Biopsied. ?                       - Small hiatal hernia. ?                       - Multiple fundic gland polyps. One polyp resected and  ?                       retrieved. ?                       - Normal examined duodenum. ?Recommendation:        - Await pathology results. ?                       - Perform a colonoscopy today. ?Procedure Code(s):     --- Professional --- ?                       [phone], Esophagogastroduodenoscopy, flexible,  ?                       transoral; with biopsy, single or multiple ?Diagnosis Code(s):     --- Professional --- ?                       K44.9, Diaphragmatic hernia without obstruction or  ?                       gangrene ?                       K31.7, Polyp of stomach and duodenum ?                       R13.10, Dysphagia, unspecified ?CPT copyright 2019 American Medical Association. All rights reserved. ?The codes documented in this report are preliminary and upon coder review may  ?be revised to meet  current compliance requirements. ?Andrey Farmer MD, MD ?12/11/2021 10:47:11 AM ?Number of Addenda: 0 ?Note Initiated On: 12/11/2021 10:26 AM ?Estimated Blood Loss:  Estimated blood loss was minimal. ?     [facility] ?

## 2021-12-12 ENCOUNTER — Encounter: Payer: Self-pay | Admitting: Gastroenterology

## 2021-12-19 LAB — SURGICAL PATHOLOGY

## 2022-01-01 ENCOUNTER — Other Ambulatory Visit (INDEPENDENT_AMBULATORY_CARE_PROVIDER_SITE_OTHER): Payer: Self-pay | Admitting: Nurse Practitioner

## 2022-01-01 ENCOUNTER — Encounter: Payer: Medicare Other | Admitting: Dermatology

## 2022-03-04 ENCOUNTER — Other Ambulatory Visit: Payer: Self-pay

## 2022-03-11 ENCOUNTER — Other Ambulatory Visit: Payer: Self-pay

## 2022-03-29 ENCOUNTER — Encounter: Payer: Self-pay | Admitting: *Deleted

## 2022-04-01 ENCOUNTER — Ambulatory Visit: Payer: Medicare Other | Admitting: Anesthesiology

## 2022-04-01 ENCOUNTER — Encounter: Payer: Self-pay | Admitting: Anesthesiology

## 2022-04-01 ENCOUNTER — Ambulatory Visit
Admission: RE | Admit: 2022-04-01 | Discharge: 2022-04-01 | Disposition: A | Discharge status: Home / Self Care | Payer: Medicare Other | Attending: Gastroenterology | Admitting: Gastroenterology

## 2022-04-01 ENCOUNTER — Encounter
Admission: RE | Disposition: A | Discharge status: Home / Self Care | Payer: Self-pay | Source: Home / Self Care | Attending: Gastroenterology

## 2022-04-01 DIAGNOSIS — K640 First degree hemorrhoids: Secondary | ICD-10-CM | POA: Diagnosis not present

## 2022-04-01 DIAGNOSIS — M109 Gout, unspecified: Secondary | ICD-10-CM | POA: Diagnosis not present

## 2022-04-01 DIAGNOSIS — D123 Benign neoplasm of transverse colon: Secondary | ICD-10-CM | POA: Insufficient documentation

## 2022-04-01 DIAGNOSIS — G4730 Sleep apnea, unspecified: Secondary | ICD-10-CM | POA: Diagnosis not present

## 2022-04-01 DIAGNOSIS — E119 Type 2 diabetes mellitus without complications: Secondary | ICD-10-CM | POA: Diagnosis not present

## 2022-04-01 DIAGNOSIS — E669 Obesity, unspecified: Secondary | ICD-10-CM | POA: Insufficient documentation

## 2022-04-01 DIAGNOSIS — Z6838 Body mass index (BMI) 38.0-38.9, adult: Secondary | ICD-10-CM | POA: Diagnosis not present

## 2022-04-01 DIAGNOSIS — Z1211 Encounter for screening for malignant neoplasm of colon: Secondary | ICD-10-CM | POA: Insufficient documentation

## 2022-04-01 DIAGNOSIS — K635 Polyp of colon: Secondary | ICD-10-CM | POA: Insufficient documentation

## 2022-04-01 DIAGNOSIS — G709 Myoneural disorder, unspecified: Secondary | ICD-10-CM | POA: Diagnosis not present

## 2022-04-01 DIAGNOSIS — Z86718 Personal history of other venous thrombosis and embolism: Secondary | ICD-10-CM | POA: Insufficient documentation

## 2022-04-01 DIAGNOSIS — Z86010 Personal history of colonic polyps: Secondary | ICD-10-CM | POA: Insufficient documentation

## 2022-04-01 DIAGNOSIS — E039 Hypothyroidism, unspecified: Secondary | ICD-10-CM | POA: Diagnosis not present

## 2022-04-01 DIAGNOSIS — Z85850 Personal history of malignant neoplasm of thyroid: Secondary | ICD-10-CM | POA: Insufficient documentation

## 2022-04-01 DIAGNOSIS — M797 Fibromyalgia: Secondary | ICD-10-CM | POA: Insufficient documentation

## 2022-04-01 DIAGNOSIS — Z86711 Personal history of pulmonary embolism: Secondary | ICD-10-CM | POA: Diagnosis not present

## 2022-04-01 DIAGNOSIS — E785 Hyperlipidemia, unspecified: Secondary | ICD-10-CM | POA: Diagnosis not present

## 2022-04-01 DIAGNOSIS — K219 Gastro-esophageal reflux disease without esophagitis: Secondary | ICD-10-CM | POA: Diagnosis not present

## 2022-04-01 DIAGNOSIS — I10 Essential (primary) hypertension: Secondary | ICD-10-CM | POA: Diagnosis not present

## 2022-04-01 HISTORY — PX: COLONOSCOPY WITH PROPOFOL: SHX5780

## 2022-04-01 LAB — GLUCOSE, CAPILLARY: Glucose-Capillary: 134 mg/dL — ABNORMAL HIGH (ref 70–99)

## 2022-04-01 SURGERY — COLONOSCOPY WITH PROPOFOL
Anesthesia: General

## 2022-04-01 MED ORDER — PROPOFOL 1000 MG/100ML IV EMUL
INTRAVENOUS | Status: AC
  Filled 2022-04-01: qty 100

## 2022-04-01 MED ORDER — SODIUM CHLORIDE 0.9 % IV SOLN
INTRAVENOUS | Status: DC
  Administered 2022-04-01: 20 mL/h via INTRAVENOUS

## 2022-04-01 MED ORDER — PROPOFOL 500 MG/50ML IV EMUL
INTRAVENOUS | Status: DC | PRN
  Administered 2022-04-01: 150 ug/kg/min via INTRAVENOUS

## 2022-04-01 NOTE — Transfer of Care (Signed)
Immediate Anesthesia Transfer of Care Note  Patient: Katie Baker  Procedure(s) Performed: COLONOSCOPY WITH PROPOFOL  Patient Location: PACU  Anesthesia Type:General  Level of Consciousness: awake and sedated  Airway & Oxygen Therapy: Patient Spontanous Breathing and Patient connected to nasal cannula oxygen  Post-op Assessment: Report given to RN and Post -op Vital signs reviewed and stable  Post vital signs: Reviewed and stable  Last Vitals:  Vitals Value Taken Time  BP    Temp    Pulse    Resp    SpO2      Last Pain:  Vitals:   04/01/22 1014  TempSrc: Temporal  PainSc: 9          Complications: No notable events documented.

## 2022-04-01 NOTE — Interval H&P Note (Signed)
History and Physical Interval Note:  04/01/2022 10:56 AM  Katie Baker  has presented today for surgery, with the diagnosis of PH Colon Polyps.  The various methods of treatment have been discussed with the patient and family. After consideration of risks, benefits and other options for treatment, the patient has consented to  Procedure(s) with comments: COLONOSCOPY WITH PROPOFOL (N/A) - ENGLISH, DM as a surgical intervention.  The patient's history has been reviewed, patient examined, no change in status, stable for surgery.  I have reviewed the patient's chart and labs.  Questions were answered to the patient's satisfaction.     Lesly Rubenstein  Ok to proceed with colonoscopy

## 2022-04-01 NOTE — Anesthesia Procedure Notes (Signed)
Date/Time: 04/01/2022 11:03 AM  Performed by: Donalda Ewings, CindyPre-anesthesia Checklist: Patient identified, Emergency Drugs available, Suction available, Patient being monitored and Timeout performed Patient Re-evaluated:Patient Re-evaluated prior to induction Oxygen Delivery Method: Simple face mask Preoxygenation: Pre-oxygenation with 100% oxygen Induction Type: IV induction Placement Confirmation: positive ETCO2 and CO2 detector

## 2022-04-01 NOTE — H&P (Signed)
Outpatient short stay form Pre-procedure 04/01/2022  Lesly Rubenstein, MD  Primary Physician: Perrin Maltese, MD  Reason for visit:  Surveillance colonoscopy  History of present illness:    61 y/o ladywith history of DM II and chronic opioid use for chronic pain here for colonoscopy due to history of adenomatous polyps. Last colonoscopy in April with poor prep. Takes DOAC with last dose being 72 hours ago. No significant abdominal surgeries. No first degree relatives with colon cancer.    Current Facility-Administered Medications:    0.9 %  sodium chloride infusion, , Intravenous, Continuous, Mindy Behnken, Hilton Cork, MD, Last Rate: 20 mL/hr at 04/01/22 1037, 20 mL/hr at 04/01/22 1037  Medications Prior to Admission  Medication Sig Dispense Refill Last Dose   acetaminophen (TYLENOL) 500 MG tablet Take 1,000 mg by mouth every 6 (six) hours as needed for moderate pain or headache.   03/31/2022   allopurinol (ZYLOPRIM) 100 MG tablet    03/31/2022   B Complex Vitamins (VITAMIN B COMPLEX PO) Take 1 tablet by mouth daily.   03/31/2022   Biotin 10 MG TABS Take 10 mg by mouth daily.   03/31/2022   Cholecalciferol 25 MCG (1000 UT) tablet Take 5,000 Units by mouth daily.   03/31/2022   colchicine 0.6 MG tablet Take 1 tablet by mouth in the morning and at bedtime.   03/31/2022   cycloSPORINE (RESTASIS) 0.05 % ophthalmic emulsion Apply to eye.   03/31/2022   dapagliflozin propanediol (FARXIGA) 5 MG TABS tablet Take 10 mg by mouth daily.    03/31/2022   dicyclomine (BENTYL) 10 MG capsule Take 20 mg by mouth every 6 (six) hours.   03/31/2022   ELIQUIS 5 MG TABS tablet TAKE ONE (1) TABLET BY MOUTH TWO TIMES PER DAY 60 tablet 5 Past Week   furosemide (LASIX) 40 MG tablet Take 40 mg by mouth daily.   03/31/2022   gabapentin (NEURONTIN) 300 MG capsule Take 300 mg by mouth 2 (two) times daily.   03/31/2022   glimepiride (AMARYL) 2 MG tablet Take 2 mg by mouth daily with breakfast.    03/31/2022   hydroxychloroquine  (PLAQUENIL) 200 MG tablet Take 200 mg by mouth 2 (two) times daily.   03/31/2022   levothyroxine (SYNTHROID, LEVOTHROID) 175 MCG tablet Take 175 mcg by mouth daily before breakfast.  1 04/01/2022 at 0600   lidocaine (LIDODERM) 5 % Place 1 patch onto the skin daily. For pain   03/31/2022   linaclotide (LINZESS) 145 MCG CAPS capsule Take 1 capsule by mouth daily.   03/31/2022   lisinopril (PRINIVIL,ZESTRIL) 40 MG tablet Take 40 mg by mouth daily.   03/31/2022   morphine (MS CONTIN) 15 MG 12 hr tablet Take 1 tablet (15 mg total) by mouth every 12 (twelve) hours. 30 tablet 0 Past Month   Multiple Vitamins-Minerals (VITAMIN-MINERAL SUPPLEMENT PO) Take by mouth daily. Calcium, Magnesium, and Zinc plus Vitamin D-3   03/31/2022   pantoprazole (PROTONIX) 40 MG tablet Take 40 mg by mouth 2 (two) times daily.   5 03/31/2022   potassium chloride (MICRO-K) 10 MEQ CR capsule Take 10 mEq by mouth every morning.   03/31/2022   predniSONE (DELTASONE) 10 MG tablet    03/31/2022   RESTASIS 0.05 % ophthalmic emulsion    03/31/2022   rosuvastatin (CRESTOR) 40 MG tablet Take 40 mg by mouth daily.    03/31/2022   Semaglutide (OZEMPIC, 0.25 OR 0.5 MG/DOSE, [place]) Inject into the skin as directed.   03/31/2022  VITAMIN E PO Take by mouth daily.   03/31/2022     No Known Allergies   Past Medical History:  Diagnosis Date   Deep vein thrombosis (DVT) (HCC)    Diabetes mellitus, type II (HCC)    Dislocation of hip, congenital    Fibromyalgia    Gout    Hearing loss    left ear   Hyperlipidemia    Hypertension    Hypothyroidism    Obesity    Prediabetes    Pulmonary embolism ([place]) 01/2017   Reflux    Sleep apnea    Spinal stenosis    Tendonitis    Thyroid cancer ([place])     Review of systems:  Otherwise negative.    Physical Exam  Gen: Alert, oriented. Appears stated age.  HEENT: PERRLA. Lungs: No respiratory distress CV: RRR Abd: soft, benign, no masses Ext: No edema    Planned procedures: Proceed with  colonoscopy. The patient understands the nature of the planned procedure, indications, risks, alternatives and potential complications including but not limited to bleeding, infection, perforation, damage to internal organs and possible oversedation/side effects from anesthesia. The patient agrees and gives consent to proceed.  Please refer to procedure notes for findings, recommendations and patient disposition/instructions.     Lesly Rubenstein, MD [facility]

## 2022-04-01 NOTE — Op Note (Signed)
[facility] Gastroenterology Patient Name: Katie Baker Procedure Date: 04/01/2022 11:01 AM MRN: [id number] Account #: [id number] Date of Birth: [birth year] Admit Type: Outpatient Age: 61 Room: [facility] ENDO ROOM 3 Gender: Female Note Status: Finalized Instrument Name: Jasper Riling [id number] Procedure:             Colonoscopy Indications:           Surveillance: Personal history of adenomatous polyps                         on last colonoscopy > 5 years ago Providers:             Andrey Farmer MD, MD Referring MD:          Perrin Maltese, MD (Referring MD) Medicines:             Monitored Anesthesia Care Complications:         No immediate complications. Estimated blood loss:                         Minimal. Procedure:             Pre-Anesthesia Assessment:                        - Prior to the procedure, a History and Physical was                         performed, and patient medications and allergies were                         reviewed. The patient is competent. The risks and                         benefits of the procedure and the sedation options and                         risks were discussed with the patient. All questions                         were answered and informed consent was obtained.                         Patient identification and proposed procedure were                         verified by the physician, the nurse, the                         anesthesiologist, the anesthetist and the technician                         in the endoscopy suite. Mental Status Examination:                         alert and oriented. Airway Examination: normal                         oropharyngeal airway and neck mobility. Respiratory  Examination: clear to auscultation. CV Examination:                         normal. Prophylactic Antibiotics: The patient does not                         require prophylactic antibiotics. Prior                          Anticoagulants: The patient has taken Eliquis                         (apixaban), last dose was 3 days prior to procedure.                         ASA Grade Assessment: III - A patient with severe                         systemic disease. After reviewing the risks and                         benefits, the patient was deemed in satisfactory                         condition to undergo the procedure. The anesthesia                         plan was to use monitored anesthesia care (MAC).                         Immediately prior to administration of medications,                         the patient was re-assessed for adequacy to receive                         sedatives. The heart rate, respiratory rate, oxygen                         saturations, blood pressure, adequacy of pulmonary                         ventilation, and response to care were monitored                         throughout the procedure. The physical status of the                         patient was re-assessed after the procedure.                        After obtaining informed consent, the colonoscope was                         passed under direct vision. Throughout the procedure,                         the patient's blood pressure, pulse, and oxygen  saturations were monitored continuously. The                         Colonoscope was introduced through the anus and                         advanced to the the cecum, identified by appendiceal                         orifice and ileocecal valve. The colonoscopy was                         performed without difficulty. The patient tolerated                         the procedure well. The quality of the bowel                         preparation was good. Findings:      The perianal and digital rectal examinations were normal.      A 1 mm polyp was found in the cecum. The polyp was sessile. The polyp       was removed with a jumbo cold forceps.  Resection and retrieval were       complete. Estimated blood loss was minimal.      A 3 mm polyp was found in the transverse colon. The polyp was sessile.       The polyp was removed with a cold snare. Resection and retrieval were       complete. Estimated blood loss was minimal.      Internal hemorrhoids were found during retroflexion. The hemorrhoids       were Grade I (internal hemorrhoids that do not prolapse).      The exam was otherwise without abnormality on direct and retroflexion       views. Impression:            - One 1 mm polyp in the cecum, removed with a jumbo                         cold forceps. Resected and retrieved.                        - One 3 mm polyp in the transverse colon, removed with                         a cold snare. Resected and retrieved.                        - Internal hemorrhoids.                        - The examination was otherwise normal on direct and                         retroflexion views. Recommendation:        - Discharge patient to home.                        - Resume previous diet.                        -  Continue present medications.                        - Await pathology results.                        - Repeat colonoscopy in 7 years for surveillance.                        - Return to referring physician as previously                         scheduled. Procedure Code(s):     --- Professional ---                        [phone], Colonoscopy, flexible; with removal of                         tumor(s), polyp(s), or other lesion(s) by snare                         technique                        45380, 76, Colonoscopy, flexible; with biopsy, single                         or multiple Diagnosis Code(s):     --- Professional ---                        Z86.010, Personal history of colonic polyps                        K63.5, Polyp of colon                        K64.0, First degree hemorrhoids CPT copyright 2019 American Medical  Association. All rights reserved. The codes documented in this report are preliminary and upon coder review may  be revised to meet current compliance requirements. Andrey Farmer MD, MD 04/01/2022 11:34:21 AM Number of Addenda: 0 Note Initiated On: 04/01/2022 11:01 AM Scope Withdrawal Time: 0 hours 11 minutes 3 seconds  Total Procedure Duration: 0 hours 20 minutes 9 seconds  Estimated Blood Loss:  Estimated blood loss was minimal.      [facility]

## 2022-04-01 NOTE — Anesthesia Preprocedure Evaluation (Signed)
Anesthesia Evaluation  Patient identified by MRN, date of birth, ID band Patient awake    Reviewed: Allergy & Precautions, NPO status , Patient's Chart, lab work & pertinent test results  History of Anesthesia Complications Negative for: history of anesthetic complications  Airway Mallampati: III  TM Distance: <3 FB Neck ROM: full    Dental  (+) Chipped   Pulmonary sleep apnea ,    Pulmonary exam normal        Cardiovascular Exercise Tolerance: Good hypertension, (-) anginanegative cardio ROS Normal cardiovascular exam     Neuro/Psych  Headaches,  Neuromuscular disease negative psych ROS   GI/Hepatic negative GI ROS, Neg liver ROS, neg GERD  ,  Endo/Other  negative endocrine ROSdiabetesHypothyroidism   Renal/GU negative Renal ROS  negative genitourinary   Musculoskeletal   Abdominal   Peds  Hematology negative hematology ROS (+)   Anesthesia Other Findings Patient is NPO appropriate and reports no nausea or vomiting today.  Past Medical History: No date: Deep vein thrombosis (DVT) (HCC) No date: Diabetes mellitus, type II (HCC) No date: Dislocation of hip, congenital No date: Fibromyalgia No date: Gout No date: Hearing loss     Comment:  left ear No date: Hyperlipidemia No date: Hypertension No date: Hypothyroidism No date: Obesity No date: Prediabetes 01/2017: Pulmonary embolism (HCC) No date: Reflux No date: Sleep apnea No date: Spinal stenosis No date: Tendonitis No date: Thyroid cancer [facility])  Past Surgical History: 02/09/2015: COLONOSCOPY WITH PROPOFOL; N/A     Comment:  Procedure: COLONOSCOPY WITH PROPOFOL;  Surgeon: [name], MD;  Location: [facility] ENDOSCOPY;  Service:               Endoscopy;  Laterality: N/A; 12/11/2021: COLONOSCOPY WITH PROPOFOL; N/A     Comment:  Procedure: COLONOSCOPY WITH PROPOFOL;  Surgeon:               Lesly Rubenstein, MD;  Location: [facility]  ENDOSCOPY;                Service: Endoscopy;  Laterality: N/A; 02/09/2015: ESOPHAGOGASTRODUODENOSCOPY; N/A     Comment:  Procedure: ESOPHAGOGASTRODUODENOSCOPY (EGD);  Surgeon:               [name], MD;  Location: [facility] ENDOSCOPY;                Service: Endoscopy;  Laterality: N/A; 12/11/2021: ESOPHAGOGASTRODUODENOSCOPY (EGD) WITH PROPOFOL; N/A     Comment:  Procedure: ESOPHAGOGASTRODUODENOSCOPY (EGD) WITH               PROPOFOL;  Surgeon: Lesly Rubenstein, MD;  Location:               [facility] ENDOSCOPY;  Service: Endoscopy;  Laterality: N/A; No date: HIP SURGERY 02/14/2017: IVC FILTER INSERTION; N/A     Comment:  Procedure: IVC Filter Insertion;  Surgeon: Katha Cabal, MD;  Location: [place] CV [facility];  Service:              Cardiovascular;  Laterality: N/A; 03/24/2018: IVC FILTER REMOVAL; N/A     Comment:  Procedure: IVC FILTER REMOVAL;  Surgeon: Katha Cabal, MD;  Location: [place] [facility];  Service:  Cardiovascular;  Laterality: N/A; No date: LIMB SPARING RESECTION HIP W/ SADDLE JOINT REPLACEMENT;  Bilateral No date: THYROID SURGERY No date: TOTAL HIP REVISION  BMI    Body Mass Index: 38.11 kg/m      Reproductive/Obstetrics negative OB ROS                             Anesthesia Physical Anesthesia Plan  ASA: 3  Anesthesia Plan: General   Post-op Pain Management:    Induction: Intravenous  PONV Risk Score and Plan: Propofol infusion and TIVA  Airway Management Planned: Natural Airway and Nasal Cannula  Additional Equipment:   Intra-op Plan:   Post-operative Plan:   Informed Consent: I have reviewed the patients History and Physical, chart, labs and discussed the procedure including the risks, benefits and alternatives for the proposed anesthesia with the patient or authorized representative who has indicated his/her understanding and acceptance.     Dental Advisory  Given  Plan Discussed with: Anesthesiologist, CRNA and Surgeon  Anesthesia Plan Comments: (Patient consented for risks of anesthesia including but not limited to:  - adverse reactions to medications - risk of airway placement if required - damage to eyes, teeth, lips or other oral mucosa - nerve damage due to positioning  - sore throat or hoarseness - Damage to heart, brain, nerves, lungs, other parts of body or loss of life  Patient voiced understanding.)        Anesthesia Quick Evaluation

## 2022-04-01 NOTE — Anesthesia Postprocedure Evaluation (Signed)
Anesthesia Post Note  Patient: Katie Baker  Procedure(s) Performed: COLONOSCOPY WITH PROPOFOL  Patient location during evaluation: Endoscopy Anesthesia Type: General Level of consciousness: awake and alert Pain management: pain level controlled Vital Signs Assessment: post-procedure vital signs reviewed and stable Respiratory status: spontaneous breathing, nonlabored ventilation, respiratory function stable and patient connected to nasal cannula oxygen Cardiovascular status: blood pressure returned to baseline and stable Postop Assessment: no apparent nausea or vomiting Anesthetic complications: no   No notable events documented.   Last Vitals:  Vitals:   04/01/22 1142 04/01/22 1152  BP: 110/79 100/69  Pulse: 84   Resp: 18   Temp:    SpO2: 100%     Last Pain:  Vitals:   04/01/22 1152  TempSrc:   PainSc: 0-No pain                 Precious Haws Ian Cavey

## 2022-04-02 ENCOUNTER — Encounter: Payer: Self-pay | Admitting: Gastroenterology

## 2022-04-02 LAB — SURGICAL PATHOLOGY

## 2022-04-03 ENCOUNTER — Other Ambulatory Visit: Payer: Self-pay | Admitting: Pharmacy Technician

## 2022-04-03 NOTE — Patient Outreach (Signed)
Received provider portion of PAP applications for Ozempic, Farxiga & Eliquis.  Patient still needs to sign her portion and provide proof of income.  Attempted to contact patient to make aware.  Unable to reach.  Left a message for patient to call back.  [facility] [place]

## 2022-05-31 ENCOUNTER — Other Ambulatory Visit (INDEPENDENT_AMBULATORY_CARE_PROVIDER_SITE_OTHER): Payer: Self-pay | Admitting: Nurse Practitioner

## 2022-06-06 ENCOUNTER — Other Ambulatory Visit: Payer: Self-pay | Admitting: Anesthesiology

## 2022-06-06 ENCOUNTER — Other Ambulatory Visit (HOSPITAL_COMMUNITY): Payer: Self-pay | Admitting: Anesthesiology

## 2022-06-06 DIAGNOSIS — M5410 Radiculopathy, site unspecified: Secondary | ICD-10-CM

## 2022-06-06 DIAGNOSIS — M545 Low back pain, unspecified: Secondary | ICD-10-CM

## 2022-06-14 ENCOUNTER — Ambulatory Visit: Payer: Medicare Other | Admitting: Cardiovascular Disease

## 2022-06-27 ENCOUNTER — Ambulatory Visit
Admission: RE | Admit: 2022-06-27 | Discharge: 2022-06-27 | Disposition: A | Discharge status: Home / Self Care | Payer: Medicare Other | Source: Ambulatory Visit | Attending: Anesthesiology | Admitting: Anesthesiology

## 2022-06-27 ENCOUNTER — Other Ambulatory Visit: Payer: Self-pay

## 2022-06-27 DIAGNOSIS — M545 Low back pain, unspecified: Secondary | ICD-10-CM

## 2022-06-27 DIAGNOSIS — M5410 Radiculopathy, site unspecified: Secondary | ICD-10-CM

## 2022-06-27 DIAGNOSIS — G8929 Other chronic pain: Secondary | ICD-10-CM | POA: Insufficient documentation

## 2022-07-01 ENCOUNTER — Encounter (INDEPENDENT_AMBULATORY_CARE_PROVIDER_SITE_OTHER): Payer: Self-pay

## 2022-08-05 NOTE — Progress Notes (Deleted)
Referring Physician:  Perrin Maltese, MD [address] [place],  [place] [address]  Primary Physician:  Perrin Maltese, MD  History of Present Illness: 08/05/2022*** Katie Baker has a history of DM (sees endocrine, last HgbA1c was 7.3 on 05/21/22), thyroid CA with postop surgical hypothyroidism, chronic gastritis, PE and DVT, FM, skin CA, gout, HTN, obesity, OSA on CPAP.    Did she see gyn for MRI results?***  She is on ELIQUIS.   Duration: *** Location: *** Quality: *** Severity: ***  Precipitating: aggravated by *** Modifying factors: made better by *** Weakness: none Timing: *** Bowel/Bladder Dysfunction: none  Conservative measures:  Physical therapy: ***  Multimodal medical therapy including regular antiinflammatories: tylenol, neurontin, lidoderm patches, MS Contin.  Injections: *** epidural steroid injections  Past Surgery: ***  [name] has ***no symptoms of cervical myelopathy.  The symptoms are causing a significant impact on the patient's life.   Review of Systems:  A 10 point review of systems is negative, except for the pertinent positives and negatives detailed in the HPI.  Past Medical History: Past Medical History:  Diagnosis Date   Deep vein thrombosis (DVT) (HCC)    Diabetes mellitus, type II (HCC)    Dislocation of hip, congenital    Fibromyalgia    Gout    Hearing loss    left ear   Hyperlipidemia    Hypertension    Hypothyroidism    Obesity    Prediabetes    Pulmonary embolism ([place]) 01/2017   Reflux    Sleep apnea    Spinal stenosis    Tendonitis    Thyroid cancer [facility])     Past Surgical History: Past Surgical History:  Procedure Laterality Date   COLONOSCOPY WITH PROPOFOL N/A 02/09/2015   Procedure: COLONOSCOPY WITH PROPOFOL;  Surgeon: [name], MD;  Location[facility] END[facility]COPY;  Service: Endoscopy;  Laterality: N/A;   COLONOSCOPY WITH PROPOFOL N/A 12/11/2021   Procedure: COLONOSCOPY WITH PROPOFOL;  Surgeon:  Lesly Rubenstein, MD;  Location: [facility] ENDOSCOPY;  Service: Endoscopy;  Laterality: N/A;   COLONOSCOPY WITH PROPOFOL N/A 04/01/2022   Procedure: COLONOSCOPY WITH PROPOFOL;  Surgeon: Lesly Rubenstein, MD;  Location: [facility] ENDOSCOPY;  Service: Endoscopy;  Laterality: N/A;  ENGLISH, DM   ESOPHAGOGASTRODUODENOSCOPY N/A 02/09/2015   Procedure: ESOPHAGOGASTRODUODENOSCOPY (EGD);  Surgeon: [name], MD;  Location: [facility] ENDOSCOPY;  Service: Endoscopy;  Laterality: N/A;   ESOPHAGOGASTRODUODENOSCOPY (EGD) WITH PROPOFOL N/A 12/11/2021   Procedure: ESOPHAGOGASTRODUODENOSCOPY (EGD) WITH PROPOFOL;  Surgeon: Lesly Rubenstein, MD;  Location: [facility] ENDOSCOPY;  Service: Endoscopy;  Laterality: N/A;   HIP SURGERY     IVC FILTER INSERTION N/A 02/14/2017   Procedure: IVC Filter Insertion;  Surgeon: Katha Cabal, MD;  Location: [place] [facility];  Service: Cardiovascular;  Laterality: N/A;   IVC FILTER REMOVAL N/A 03/24/2018   Procedure: IVC FILTER REMOVAL;  Surgeon: Katha Cabal, MD;  Location: [place] [facility];  Service: Cardiovascular;  Laterality: N/A;   LIMB SPARING RESECTION HIP W/ SADDLE JOINT REPLACEMENT Bilateral    THYROID SURGERY     TOTAL HIP REVISION      Allergies: Allergies as of 08/06/2022   (No Known Allergies)    Medications: Outpatient Encounter Medications as of 08/06/2022  Medication Sig   acetaminophen (TYLENOL) 500 MG tablet Take 1,000 mg by mouth every 6 (six) hours as needed for moderate pain or headache.   allopurinol (ZYLOPRIM) 100 MG tablet    B Complex  Vitamins (VITAMIN B COMPLEX PO) Take 1 tablet by mouth daily.   Biotin 10 MG TABS Take 10 mg by mouth daily.   Cholecalciferol 25 MCG (1000 UT) tablet Take 5,000 Units by mouth daily.   colchicine 0.6 MG tablet Take 1 tablet by mouth in the morning and at bedtime.   cycloSPORINE (RESTASIS) 0.05 % ophthalmic emulsion Apply to eye.   dapagliflozin propanediol (FARXIGA) 5 MG TABS tablet Take 10 mg by  mouth daily.    dicyclomine (BENTYL) 10 MG capsule Take 20 mg by mouth every 6 (six) hours.   ELIQUIS 5 MG TABS tablet TAKE ONE (1) TABLET BY MOUTH TWO TIMES PER DAY   furosemide (LASIX) 40 MG tablet Take 40 mg by mouth daily.   gabapentin (NEURONTIN) 300 MG capsule Take 300 mg by mouth 2 (two) times daily.   glimepiride (AMARYL) 2 MG tablet Take 2 mg by mouth daily with breakfast.    hydroxychloroquine (PLAQUENIL) 200 MG tablet Take 200 mg by mouth 2 (two) times daily.   levothyroxine (SYNTHROID, LEVOTHROID) 175 MCG tablet Take 175 mcg by mouth daily before breakfast.   lidocaine (LIDODERM) 5 % Place 1 patch onto the skin daily. For pain   linaclotide (LINZESS) 145 MCG CAPS capsule Take 1 capsule by mouth daily.   lisinopril (PRINIVIL,ZESTRIL) 40 MG tablet Take 40 mg by mouth daily.   morphine (MS CONTIN) 15 MG 12 hr tablet Take 1 tablet (15 mg total) by mouth every 12 (twelve) hours.   Multiple Vitamins-Minerals (VITAMIN-MINERAL SUPPLEMENT PO) Take by mouth daily. Calcium, Magnesium, and Zinc plus Vitamin D-3   pantoprazole (PROTONIX) 40 MG tablet Take 40 mg by mouth 2 (two) times daily.    potassium chloride (MICRO-K) 10 MEQ CR capsule Take 10 mEq by mouth every morning.   predniSONE (DELTASONE) 10 MG tablet    RESTASIS 0.05 % ophthalmic emulsion    rosuvastatin (CRESTOR) 40 MG tablet Take 40 mg by mouth daily.    Semaglutide (OZEMPIC, 0.25 OR 0.5 MG/DOSE, [place]) Inject into the skin as directed.   VITAMIN E PO Take by mouth daily.   No facility-administered encounter medications on file as of 08/06/2022.    Social History: Social History   Tobacco Use   Smoking status: Never   Smokeless tobacco: Never  Vaping Use   Vaping Use: Never used  Substance Use Topics   Alcohol use: No    Alcohol/week: 0.0 standard drinks of alcohol   Drug use: No    Family Medical History: Family History  Problem Relation Age of Onset   Heart disease Mother    Hypertension Mother    Alzheimer's  disease Mother    Diabetes Mother    Stroke Mother    Anxiety disorder Mother    Breast cancer Mother        at 54 years   Diabetes Father    Alcohol abuse Father    Bladder Cancer Sister    Thyroid disease Brother    Diabetes Daughter    Diabetes Maternal Aunt     Physical Examination: There were no vitals filed for this visit.  General: Patient is well developed, well nourished, calm, collected, and in no apparent distress. Attention to examination is appropriate.  Respiratory: Patient is breathing without any difficulty.   NEUROLOGICAL:     Awake, alert, oriented to person, place, and time.  Speech is clear and fluent. Fund of knowledge is appropriate.   Cranial Nerves: Pupils equal round and reactive to light.  Facial tone is symmetric.  Facial sensation is symmetric.  ROM of spine:  *** ROM of cervical spine *** pain *** ROM of lumbar spine *** pain  No abnormal lesions on exposed skin.   Strength: Side Biceps Triceps Deltoid Interossei Grip Wrist Ext. Wrist Flex.  R '5 5 5 5 5 5 5  '$ L '5 5 5 5 5 5 5   '$ Side Iliopsoas Quads Hamstring PF DF EHL  R '5 5 5 5 5 5  '$ L '5 5 5 5 5 5   '$ Reflexes are ***2+ and symmetric at the biceps, triceps, brachioradialis, patella and achilles.   Hoffman's is absent.  Clonus is not present.   Bilateral upper and lower extremity sensation is intact to light touch.    No evidence of dysmetria noted.  Gait is normal.   ***No difficulty with tandem gait.    Medical Decision Making  Imaging: MRI of thoracic and lumbar spine dated 06/27/22:   FINDINGS: MRI THORACIC SPINE FINDINGS   Alignment:  Negative for listhesis.   Vertebrae: No fracture, evidence of discitis, or bone lesion.   Cord:  Normal signal and morphology.   Paraspinal and other soft tissues: Negative for perispinal mass or inflammation   Disc levels:   Diffuse facet spurring and nodular ligamentum flavum thickening and likely calcification. These areas encroach on  the posterior thecal sac but no cord impingement. Generalized midthoracic disc desiccation and narrowing. There is evidence of a right paracentral to foraminal herniation at T4-5, with mild to moderate foraminal stenosis. Facet spurs encroach on the right foramina at T9-10 to T11-12, stenosis advanced at T11-12.   MRI LUMBAR SPINE FINDINGS   Segmentation:  5 lumbar type vertebrae   Alignment:  Mild scoliosis and L3-4 anterolisthesis.   Vertebrae:  Negative.   Conus medullaris and cauda equina: Conus extends to the L1 level. Conus and cauda equina appear normal.   Paraspinal and other soft tissues: Negative for perispinal mass or inflammation. Prominent atrophy of intrinsic back muscles. Fibroid appearance of the uterus on the scanogram. Abnormal endometrial thickness for age, 8 mm on sagittal T2 weighted imaging.   Disc levels:   T12- L1: Unremarkable.   L1-L2: Degenerative facet spurring.  No herniation or impingement   L2-L3: Degenerative facet spurring and mild disc bulging.   L3-L4: Facet spurring and mild anterolisthesis.  No impingement   L4-L5: Degenerative facet spurring with ankylosis. Mild anterolisthesis.   L5-S1:Degenerative facet spurring.  No herniation or impingement.   IMPRESSION: MR THORACIC SPINE IMPRESSION   1. Generalized degeneration especially affecting facets with multilevel nodular ligamentum flavum thickening. Generalized progression since 2018. 2. T4-5 right paracentral to foraminal protrusion which could affect the right T4 nerve root. 3. Right foraminal stenosis from facet spurring at T9-10 to T11-12, advanced at T11-12.   MR LUMBAR SPINE IMPRESSION   1. Abnormal endometrial thickness (8 mm) for postmenopausal age, new since 2018. Recommend gynecology follow-up. 2. Generalized progression of lumbar spine degeneration since 2018 with mild anterolisthesis at L3-4 and L4-5. 3. No focal or compressive spinal stenosis.      Electronically Signed   By: Jorje Guild M.D.   On: 07/01/2022 08:27   I have personally reviewed the images and agree with the above interpretation.  Assessment and Plan: Ms. Yarbough is a pleasant 61 y.o. female with ***  Treatment options discussed with patient and following plan made:    - History of chronic gastritis and she is on ELIQUIS- medications are limited. Do  not recommend NSAIDs.   - Order for physical therapy for *** spine ***. - Continue on current medications including ***. Reviewed proper dosing along with risks and benefits. Take and NSAIDs with food.      I spent a total of *** minutes in face-to-face and non-face-to-face activities related to this patient's care toda including review of outside records, review of imaging, review of symptoms, physical exam, discussion of differential diagnosis, discussion of treatment options, and documentation.   Thank you for involving me in the care of this patient.   Geronimo Boot PA-C Dept. of Neurosurgery

## 2022-08-06 ENCOUNTER — Ambulatory Visit: Payer: Medicare Other | Admitting: Orthopedic Surgery

## 2022-09-04 NOTE — Progress Notes (Unsigned)
Referring Physician:  Perrin Maltese, MD [address] [place],  [place] [address]  Primary Physician:  Perrin Maltese, MD  History of Present Illness: 09/05/2022 Katie Baker has a history of DM (sees endocrine, last HgbA1c was 7.3 on 05/21/22), thyroid CA with postop surgical hypothyroidism, chronic gastritis, PE and DVT, FM, skin CA, gout, HTN, obesity, OSA on CPAP.   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 She has leg length discrepancy and her right leg is longer than her left.   She has chronic pain and is on MS Contin from pain management.   She has noted significant decline in her mobility over the last 5 years. She has difficulty walking- feels like her legs won't work. She is using a walker at home, but does not ambulate outside of the house.   She has constant neck and bilateral arm pain. She notes some difficulty using her hands, opening bottles, and is dropping things.   She has intermittent mid back and intermittent LBP with bilateral leg pain (entire leg) to her feet. She has intermittent stabbing pain in mid-lower back. She notes weakness in both legs.   She was seen at [facility] for her neck in 2016- was told she had cervical stenosis. Surgery was discussed and she declined.   She is on ELIQUIS.    Conservative measures:  Physical therapy: no recent PT  Multimodal medical therapy including regular antiinflammatories: tylenol, neurontin, lidoderm patches, MS Contin.  Injections: No recent epidural steroid injections  Past Surgery: no spinal surgery  Katie Baker has symptoms of cervical myelopathy.  The symptoms are causing a significant impact on the patient's life.   Review of Systems:  A 10 point review of systems is negative, except for the pertinent positives and negatives detailed in the HPI.  Past Medical History: Past Medical History:  Diagnosis Date   Deep vein thrombosis (DVT) (HCC)    Diabetes  mellitus, type II (HCC)    Dislocation of hip, congenital    Fibromyalgia    Gout    Hearing loss    left ear   Hyperlipidemia    Hypertension    Hypothyroidism    Obesity    Prediabetes    Pulmonary embolism ([place]) 01/2017   Reflux    Sleep apnea    Spinal stenosis    Tendonitis    Thyroid cancer [facility])     Past Surgical History: Past Surgical History:  Procedure Laterality Date   COLONOSCOPY WITH PROPOFOL N/A 02/09/2015   Procedure: COLONOSCOPY WITH PROPOFOL;  Surgeon: [name], MD;  Location: [facility] ENDOSCOPY;  Service: Endoscopy;  Laterality: N/A;   COLONOSCOPY WITH PROPOFOL N/A 12/11/2021   Procedure: COLONOSCOPY WITH PROPOFOL;  Surgeon: Lesly Rubenstein, MD;  Location: [facility] ENDOSCOPY;  Service: Endoscopy;  Laterality: N/A;   COLONOSCOPY WITH PROPOFOL N/A 04/01/2022   Procedure: COLONOSCOPY WITH PROPOFOL;  Surgeon: Lesly Rubenstein, MD;  Location: [facility] ENDOSCOPY;  Service: Endoscopy;  Laterality: N/A;  ENGLISH, DM   ESOPHAGOGASTRODUODENOSCOPY N/A 02/09/2015   Procedure: ESOPHAGOGASTRODUODENOSCOPY (EGD);  Surgeon: [name], MD;  Location: [facility] ENDOSCOPY;  Service: Endoscopy;  Laterality: N/A;   ESOPHAGOGASTRODUODENOSCOPY (EGD) WITH PROPOFOL N/A 12/11/2021   Procedure: ESOPHAGOGASTRODUODENOSCOPY (EGD) WITH PROPOFOL;  Surgeon: Lesly Rubenstein, MD;  Location: [facility] ENDOSCOPY;  Service: Endoscopy;  Laterality: N/A;   HIP SURGERY     IVC FILTER INSERTION N/A 02/14/2017   Procedure: IVC Filter Insertion;  Surgeon:  Schnier, Dolores Lory, MD;  Location: [facility] CV LAB;  Service: Cardiovascular;  Laterality: N/A;   IVC FILTER REMOVAL N/A 03/24/2018   Procedure: IVC FILTER REMOVAL;  Surgeon: Katha Cabal, MD;  Location: [facility];  Service: Cardiovascular;  Laterality: N/A;   LIMB SPARING RESECTION HIP W/ SADDLE JOINT REPLACEMENT Bilateral    THYROID SURGERY     TOTAL HIP REVISION      Allergies: Allergies as of 09/05/2022   (No Known  Allergies)    Medications: Outpatient Encounter Medications as of 09/05/2022  Medication Sig   acetaminophen (TYLENOL) 500 MG tablet Take 1,000 mg by mouth every 6 (six) hours as needed for moderate pain or headache.   allopurinol (ZYLOPRIM) 100 MG tablet    B Complex Vitamins (VITAMIN B COMPLEX PO) Take 1 tablet by mouth daily.   Biotin 10 MG TABS Take 10 mg by mouth daily.   Cholecalciferol 25 MCG (1000 UT) tablet Take 5,000 Units by mouth daily.   colchicine 0.6 MG tablet Take 1 tablet by mouth in the morning and at bedtime.   cycloSPORINE (RESTASIS) 0.05 % ophthalmic emulsion Apply to eye.   dapagliflozin propanediol (FARXIGA) 5 MG TABS tablet Take 10 mg by mouth daily.    dicyclomine (BENTYL) 10 MG capsule Take 20 mg by mouth every 6 (six) hours.   ELIQUIS 5 MG TABS tablet TAKE ONE (1) TABLET BY MOUTH TWO TIMES PER DAY   furosemide (LASIX) 40 MG tablet Take 40 mg by mouth daily.   gabapentin (NEURONTIN) 300 MG capsule Take 300 mg by mouth 2 (two) times daily.   glimepiride (AMARYL) 2 MG tablet Take 2 mg by mouth daily with breakfast.    hydroxychloroquine (PLAQUENIL) 200 MG tablet Take 200 mg by mouth 2 (two) times daily.   levothyroxine (SYNTHROID, LEVOTHROID) 175 MCG tablet Take 175 mcg by mouth daily before breakfast.   lidocaine (LIDODERM) 5 % Place 1 patch onto the skin daily. For pain   linaclotide (LINZESS) 145 MCG CAPS capsule Take 1 capsule by mouth daily.   lisinopril (PRINIVIL,ZESTRIL) 40 MG tablet Take 40 mg by mouth daily.   morphine (MS CONTIN) 15 MG 12 hr tablet Take 1 tablet (15 mg total) by mouth every 12 (twelve) hours.   Multiple Vitamins-Minerals (VITAMIN-MINERAL SUPPLEMENT PO) Take by mouth daily. Calcium, Magnesium, and Zinc plus Vitamin D-3   pantoprazole (PROTONIX) 40 MG tablet Take 40 mg by mouth 2 (two) times daily.    potassium chloride (MICRO-K) 10 MEQ CR capsule Take 10 mEq by mouth every morning.   predniSONE (DELTASONE) 10 MG tablet    RESTASIS 0.05 %  ophthalmic emulsion    rosuvastatin (CRESTOR) 40 MG tablet Take 40 mg by mouth daily.    Semaglutide (OZEMPIC, 0.25 OR 0.5 MG/DOSE, [place]) Inject into the skin as directed.   VITAMIN E PO Take by mouth daily.   No facility-administered encounter medications on file as of 09/05/2022.    Social History: Social History   Tobacco Use   Smoking status: Never   Smokeless tobacco: Never  Vaping Use   Vaping Use: Never used  Substance Use Topics   Alcohol use: No    Alcohol/week: 0.0 standard drinks of alcohol   Drug use: No    Family Medical History: Family History  Problem Relation Age of Onset   Heart disease Mother    Hypertension Mother    Alzheimer's disease Mother    Diabetes Mother    Stroke Mother  Anxiety disorder Mother    Breast cancer Mother        at 85 years   Diabetes Father    Alcohol abuse Father    Bladder Cancer Sister    Thyroid disease Brother    Diabetes Daughter    Diabetes Maternal Aunt     Physical Examination: There were no vitals filed for this visit.  General: Patient is well developed, well nourished, calm, collected, and in no apparent distress. Attention to examination is appropriate.  Respiratory: Patient is breathing without any difficulty.   NEUROLOGICAL:     Awake, alert, oriented to person, place, and time.  Speech is clear and fluent. Fund of knowledge is appropriate.   Cranial Nerves: Pupils equal round and reactive to light.  Facial tone is symmetric.    She has diffuse trapezial tenderness. No significant thoracic tenderness. She has diffuse lower lumbar tenderness.   No abnormal lesions on exposed skin.   Strength: Side Biceps Triceps Deltoid Interossei Grip Wrist Ext. Wrist Flex.  R '5 5 5 5 5 5 5  '$ L '5 5 5 5 5 5 5   '$ Side Iliopsoas Quads Hamstring PF DF EHL  R '5 5 5 5 5 5  '$ L '5 5 5 5 5 5   '$ No gross weakness noted as above, but does not give great effort.   Reflexes are 2+ and symmetric at the biceps, triceps,  brachioradialis, patella and achilles.   Hoffman's is positive in both hands.  Clonus is not present.   Bilateral upper and lower extremity sensation is intact to light touch.     Medical Decision Making  Imaging: MRI of thoracic and lumbar spine dated 06/27/22:   FINDINGS: MRI THORACIC SPINE FINDINGS   Alignment:  Negative for listhesis.   Vertebrae: No fracture, evidence of discitis, or bone lesion.   Cord:  Normal signal and morphology.   Paraspinal and other soft tissues: Negative for perispinal mass or inflammation   Disc levels:   Diffuse facet spurring and nodular ligamentum flavum thickening and likely calcification. These areas encroach on the posterior thecal sac but no cord impingement. Generalized midthoracic disc desiccation and narrowing. There is evidence of a right paracentral to foraminal herniation at T4-5, with mild to moderate foraminal stenosis. Facet spurs encroach on the right foramina at T9-10 to T11-12, stenosis advanced at T11-12.   MRI LUMBAR SPINE FINDINGS   Segmentation:  5 lumbar type vertebrae   Alignment:  Mild scoliosis and L3-4 anterolisthesis.   Vertebrae:  Negative.   Conus medullaris and cauda equina: Conus extends to the L1 level. Conus and cauda equina appear normal.   Paraspinal and other soft tissues: Negative for perispinal mass or inflammation. Prominent atrophy of intrinsic back muscles. Fibroid appearance of the uterus on the scanogram. Abnormal endometrial thickness for age, 8 mm on sagittal T2 weighted imaging.   Disc levels:   T12- L1: Unremarkable.   L1-L2: Degenerative facet spurring.  No herniation or impingement   L2-L3: Degenerative facet spurring and mild disc bulging.   L3-L4: Facet spurring and mild anterolisthesis.  No impingement   L4-L5: Degenerative facet spurring with ankylosis. Mild anterolisthesis.   L5-S1:Degenerative facet spurring.  No herniation or impingement.   IMPRESSION: MR THORACIC  SPINE IMPRESSION   1. Generalized degeneration especially affecting facets with multilevel nodular ligamentum flavum thickening. Generalized progression since 2018. 2. T4-5 right paracentral to foraminal protrusion which could affect the right T4 nerve root. 3. Right foraminal stenosis from facet spurring  at T9-10 to T11-12, advanced at T11-12.   MR LUMBAR SPINE IMPRESSION   1. Abnormal endometrial thickness (8 mm) for postmenopausal age, new since 2018. Recommend gynecology follow-up. 2. Generalized progression of lumbar spine degeneration since 2018 with mild anterolisthesis at L3-4 and L4-5. 3. No focal or compressive spinal stenosis.     Electronically Signed   By: Jorje Guild M.D.   On: 07/01/2022 08:27   I have personally reviewed the images and agree with the above interpretation.  Assessment and Plan: Katie Baker is a pleasant 62 y.o. female with significant decline in her mobility over the last 5 years. She has difficulty walking- feels like her legs won't work. She is using a walker at home, but does not ambulate outside of the house.   She has constant neck and bilateral arm pain. She notes some difficulty using her hands, opening bottles, and is dropping things.   She has intermittent mid back pain and intermittent LBP with bilateral leg pain (entire leg) to her feet. She notes weakness in both legs.   She has known thoracic spondylosis with no significant central stenosis. She has known lumbar spondylosis with slip at L3-L4. This would not explain her mobility or balance issues.   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 This is likely cause of her balance/dexterity/mobility issues.   Treatment options discussed with patient and following plan made:   - Cervical MRI to evaluate for spinal stenosis. She has positive hoffmans on exam. She has balance issues and trouble with hand dexterity.  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  - MRI of lumbar spine showed abnormal endometrial thickness (8 mm) for postmenopausal age, new since 2018. Radiology recommended gynecology follow-up.  - She does not have Ob/gyn. Has not seen one in years- she had previous bad experience with severe pain and states she had to be sedated for last pelvic exam years ago. Referral done to [facility]. She will discuss her past experience with them.  - History of chronic gastritis and she is on ELIQUIS- medications are limited.  - She sees pain management Nathanial Rancher) and is on chronic MS Contin.  - Will set her up with phone review once I have MRI results.   I spent a total of 40 minutes in face-to-face and non-face-to-face activities related to this patient's care toda including review of outside records, review of imaging, review of symptoms, physical exam, discussion of differential diagnosis, discussion of treatment options, and documentation.   Thank you for involving me in the care of this patient.   Geronimo Boot PA-C Dept. of Neurosurgery

## 2022-09-05 ENCOUNTER — Encounter: Payer: Self-pay | Admitting: Orthopedic Surgery

## 2022-09-05 ENCOUNTER — Ambulatory Visit (INDEPENDENT_AMBULATORY_CARE_PROVIDER_SITE_OTHER): Payer: Medicare Other | Admitting: Orthopedic Surgery

## 2022-09-05 VITALS — BP 128/78 | Ht <= 58 in | Wt 170.0 lb

## 2022-09-05 DIAGNOSIS — M47814 Spondylosis without myelopathy or radiculopathy, thoracic region: Secondary | ICD-10-CM | POA: Diagnosis not present

## 2022-09-05 DIAGNOSIS — M542 Cervicalgia: Secondary | ICD-10-CM | POA: Diagnosis not present

## 2022-09-05 DIAGNOSIS — M4726 Other spondylosis with radiculopathy, lumbar region: Secondary | ICD-10-CM

## 2022-09-05 DIAGNOSIS — R2689 Other abnormalities of gait and mobility: Secondary | ICD-10-CM

## 2022-09-05 DIAGNOSIS — M47816 Spondylosis without myelopathy or radiculopathy, lumbar region: Secondary | ICD-10-CM

## 2022-09-05 DIAGNOSIS — M5416 Radiculopathy, lumbar region: Secondary | ICD-10-CM

## 2022-09-05 NOTE — Patient Instructions (Signed)
It was so nice to see you today, I am sorry that you are hurting so much.   I am worried that your balance issues and problems with mobility are coming from your neck.   I want to get an updated MRI of your neck to look into this further. We will get this approved and [facility] will call you to schedule.   Your mid back and lower back MRI show some wear and tear (arthritis).   MRI of your lower back also shows some thickening of your endometrial lining in your uterus. I want you to follow up with [facility] for this. I put in a referral and they should call you to schedule this. Let them know about your previous bad experiences.   We will call you to schedule a phone visit once I have your MRI results back.   Please do not hesitate to call if you have any questions or concerns. You can also message me in [place].   Geronimo Boot PA-C [phone]

## 2022-09-11 ENCOUNTER — Emergency Department
Admission: EM | Admit: 2022-09-11 | Discharge: 2022-09-11 | Disposition: A | Discharge status: Home / Self Care | Payer: Medicare Other | Attending: Emergency Medicine | Admitting: Emergency Medicine

## 2022-09-11 ENCOUNTER — Other Ambulatory Visit: Payer: Self-pay

## 2022-09-11 ENCOUNTER — Emergency Department: Payer: Medicare Other

## 2022-09-11 DIAGNOSIS — R200 Anesthesia of skin: Secondary | ICD-10-CM

## 2022-09-11 DIAGNOSIS — I10 Essential (primary) hypertension: Secondary | ICD-10-CM | POA: Insufficient documentation

## 2022-09-11 DIAGNOSIS — G510 Bell's palsy: Secondary | ICD-10-CM | POA: Diagnosis not present

## 2022-09-11 DIAGNOSIS — Z7901 Long term (current) use of anticoagulants: Secondary | ICD-10-CM | POA: Diagnosis not present

## 2022-09-11 DIAGNOSIS — E119 Type 2 diabetes mellitus without complications: Secondary | ICD-10-CM | POA: Insufficient documentation

## 2022-09-11 LAB — CBC
HCT: 42.7 % (ref 36.0–46.0)
Hemoglobin: 13.9 g/dL (ref 12.0–15.0)
MCH: 30 pg (ref 26.0–34.0)
MCHC: 32.6 g/dL (ref 30.0–36.0)
MCV: 92 fL (ref 80.0–100.0)
Platelets: 188 10*3/uL (ref 150–400)
RBC: 4.64 MIL/uL (ref 3.87–5.11)
RDW: 12.3 % (ref 11.5–15.5)
WBC: 7.1 10*3/uL (ref 4.0–10.5)
nRBC: 0 % (ref 0.0–0.2)

## 2022-09-11 LAB — COMPREHENSIVE METABOLIC PANEL
ALT: 23 U/L (ref 0–44)
AST: 24 U/L (ref 15–41)
Albumin: 3.8 g/dL (ref 3.5–5.0)
Alkaline Phosphatase: 77 U/L (ref 38–126)
Anion gap: 7 (ref 5–15)
BUN: 10 mg/dL (ref 8–23)
CO2: 27 mmol/L (ref 22–32)
Calcium: 8.9 mg/dL (ref 8.9–10.3)
Chloride: 107 mmol/L (ref 98–111)
Creatinine, Ser: 0.58 mg/dL (ref 0.44–1.00)
GFR, Estimated: >60 mL/min (ref >60)
Glucose, Bld: 201 mg/dL — ABNORMAL HIGH (ref 70–99)
Potassium: 3.5 mmol/L (ref 3.5–5.1)
Sodium: 141 mmol/L (ref 135–145)
Total Bilirubin: 0.5 mg/dL (ref 0.3–1.2)
Total Protein: 7.2 g/dL (ref 6.5–8.1)

## 2022-09-11 LAB — TROPONIN I (HIGH SENSITIVITY)
Troponin I (High Sensitivity): 3 ng/L (ref <18)
Troponin I (High Sensitivity): 4 ng/L (ref <18)

## 2022-09-11 LAB — CBG MONITORING, ED: Glucose-Capillary: 98 mg/dL (ref 70–99)

## 2022-09-11 MED ORDER — PREDNISONE 10 MG (21) PO TBPK: ORAL_TABLET | ORAL | 0 refills | Status: DC

## 2022-09-11 MED ORDER — FUROSEMIDE 20 MG PO TABS: 20.0000 mg | ORAL_TABLET | Freq: Every day | ORAL | 0 refills | Status: DC

## 2022-09-11 MED ORDER — MORPHINE SULFATE ER 15 MG PO TBCR
15.0000 mg | EXTENDED_RELEASE_TABLET | Freq: Once | ORAL | Status: AC
  Administered 2022-09-11: 15 mg via ORAL
  Filled 2022-09-11: qty 1

## 2022-09-11 MED ORDER — CARBOXYMETHYLCELLULOSE SODIUM 1 % OP SOLN: 1.0000 [drp] | Freq: Three times a day (TID) | OPHTHALMIC | 1 refills | Status: AC

## 2022-09-11 MED ORDER — MORPHINE SULFATE (PF) 4 MG/ML IV SOLN
4.0000 mg | Freq: Once | INTRAVENOUS | Status: AC
  Administered 2022-09-11: 4 mg via INTRAVENOUS
  Filled 2022-09-11: qty 1

## 2022-09-11 NOTE — ED Provider Notes (Signed)
[facility] Provider Note   Event Date/Time   First MD Initiated Contact with Patient 09/11/22 1501     (approximate) History  Numbness  HPI TANNIS BURSTEIN is a 62 y.o. female with a stated past medical history of DVT on Eliquis, left-sided Bell's palsy, hypertension, and type 2 diabetes who presents for right-sided facial numbness that is been present intermittently since yesterday.  Patient has caregiver at bedside that states that patient has had facial droop on this right side over this time as well and mild slurred speech.  The symptoms have generally improved since onset but patient still states she feels numbness to the right side of the face. ROS: Patient currently denies any vision changes, tinnitus, difficulty speaking, facial droop, sore throat, chest pain, shortness of breath, abdominal pain, nausea/vomiting/diarrhea, dysuria, or weakness/numbness/paresthesias in any extremity   Physical Exam  Triage Vital Signs: ED Triage Vitals  Enc Vitals Group     BP 09/11/22 1254 (!) 162/92     Pulse Rate 09/11/22 1254 85     Resp 09/11/22 1254 16     Temp 09/11/22 1254 98.5 F (36.9 C)     Temp Source 09/11/22 1254 Oral     SpO2 09/11/22 1254 96 %     Weight 09/11/22 1255 170 lb (77.1 kg)     Height 09/11/22 1255 '4\' 8"'$  (1.422 m)     Head Circumference --      Peak Flow --      Pain Score 09/11/22 1255 9     Pain Loc --      Pain Edu? --      Excl. in [place]? --    Most recent vital signs: Vitals:   09/11/22 1835 09/11/22 2124  BP: (!) 188/74 (!) 163/76  Pulse: 90 89  Resp: 19 18  Temp: 98.1 F (36.7 C)   SpO2: 97% 98%   General: Awake, oriented x4. CV:  Good peripheral perfusion.  Resp:  Normal effort.  Abd:  No distention.  Other:  Elderly overweight Hispanic female laying in bed in no acute distress ED Results / Procedures / Treatments  Labs (all labs ordered are listed, but only abnormal results are displayed) Labs Reviewed  COMPREHENSIVE  METABOLIC PANEL - Abnormal; Notable for the following components:      Result Value   Glucose, Bld 201 (*)    All other components within normal limits  CBC  CBG MONITORING, ED  TROPONIN I (HIGH SENSITIVITY)  TROPONIN I (HIGH SENSITIVITY)   EKG ED ECG REPORT I, Naaman Plummer, the attending physician, personally viewed and interpreted this ECG. Date: 09/11/2022 EKG Time: 1304 Rate: 82 Rhythm: normal sinus rhythm QRS Axis: normal Intervals: normal ST/T Wave abnormalities: normal Narrative Interpretation: no evidence of acute ischemia RADIOLOGY ED MD interpretation: CT of the head without contrast interpreted by me shows no evidence of acute abnormalities including no intracerebral hemorrhage, obvious masses, or significant edema -Agree with radiology assessment Official radiology report(s): MR Brain Wo Contrast (neuro protocol)  Result Date: 09/11/2022 CLINICAL DATA:  Initial evaluation for neuro deficit, stroke suspected. EXAM: MRI HEAD WITHOUT CONTRAST TECHNIQUE: Multiplanar, multiecho pulse sequences of the brain and surrounding structures were obtained without intravenous contrast. COMPARISON:  CT from earlier the same day. FINDINGS: Brain: Cerebral volume within normal limits. Few scattered subcentimeter foci of T2/FLAIR hyperintensity seen involving the supratentorial cerebral white matter, nonspecific, but overall mild in nature, and less than expected than is typically seen for age.  No evidence for acute or subacute infarct. Gray-white matter differentiation maintained. No areas of chronic cortical infarction. No acute or chronic intracranial blood products. No mass lesion, midline shift or mass effect. No hydrocephalus or extra-axial fluid collection. Pituitary gland and suprasellar region within normal limits. Vascular: Major intracranial vascular flow voids are maintained. Skull and upper cervical spine: Craniocervical junction within normal limits. Diffuse loss of normal bone  marrow signal, nonspecific but can be seen with anemia, smoking, obesity, and infiltrative/myelofibrotic marrow processes. No scalp soft tissue abnormality. Sinuses/Orbits: Globes orbital soft tissues within normal limits. Paranasal sinuses are largely clear. No mastoid effusion. Other: None. IMPRESSION: Negative brain MRI for age.  No acute intracranial abnormality. Electronically Signed   By: Jeannine Boga M.D.   On: 09/11/2022 20:13   CT Head Wo Contrast  Result Date: 09/11/2022 CLINICAL DATA:  Numbness and paresthesias. EXAM: CT HEAD WITHOUT CONTRAST TECHNIQUE: Contiguous axial images were obtained from the base of the skull through the vertex without intravenous contrast. RADIATION DOSE REDUCTION: This exam was performed according to the departmental dose-optimization program which includes automated exposure control, adjustment of the mA and/or kV according to patient size and/or use of iterative reconstruction technique. COMPARISON:  None Available. FINDINGS: Brain: No acute intracranial hemorrhage. No focal mass lesion. No CT evidence of acute infarction. No midline shift or mass effect. No hydrocephalus. Basilar cisterns are patent. Vascular: No hyperdense vessel or unexpected calcification. Skull: Normal. Negative for fracture or focal lesion. Sinuses/Orbits: Paranasal sinuses and mastoid air cells are clear. Orbits are clear. Other: None. IMPRESSION: No acute intracranial findings.  Is Electronically Signed   By: Suzy Bouchard M.D.   On: 09/11/2022 13:31   PROCEDURES: Critical Care performed: No .1-3 Lead EKG Interpretation  Performed by: Naaman Plummer, MD Authorized by: Naaman Plummer, MD     Interpretation: normal     ECG rate:  71   ECG rate assessment: normal     Rhythm: sinus rhythm     Ectopy: none     Conduction: normal    MEDICATIONS ORDERED IN ED: Medications  morphine (MS CONTIN) 12 hr tablet 15 mg (15 mg Oral Given 09/11/22 1616)  morphine (PF) 4 MG/ML injection  4 mg (4 mg Intravenous Given 09/11/22 1824)   IMPRESSION / MDM / ASSESSMENT AND PLAN / ED COURSE  I reviewed the triage vital signs and the nursing notes.                             The patient is on the cardiac monitor to evaluate for evidence of arrhythmia and/or significant heart rate changes. Patient's presentation is most consistent with acute presentation with potential threat to life or bodily function. + Acute, painless, unilateral facial paralysis w no forehead sparing ML 2/2 Bells Palsy.  Unlikely CVA, trigeminal neuralgia, Botulism, MG, ICH.  Rx: - Artificial tears qhr while patient is awake AND ophthalmic ointment at night to prevent corneal exposure keratitis (consider protective goggles or taping) - Since within 72hours of onset will Rx Prednisone '60mg'$  qday x1wk.  Disposition: SRP and PCP follow up within the next week discussed. Advised if with incomplete facial recovery 3 months after initial symptom onset may need referral to ophthalmology +/- facial nerve specialist.   FINAL CLINICAL IMPRESSION(S) / ED DIAGNOSES   Final diagnoses:  Right facial numbness  Bell's palsy   Rx / DC Orders   ED Discharge Orders  Ordered    carboxymethylcellulose 1 % ophthalmic solution  3 times daily        09/11/22 2055    predniSONE (STERAPRED UNI-PAK 21 TAB) 10 MG (21) TBPK tablet        09/11/22 2055    furosemide (LASIX) 20 MG tablet  Daily        09/11/22 2102           Note:  This document was prepared using Dragon voice recognition software and may include unintentional dictation errors.   Naaman Plummer, MD 09/11/22 [phone]

## 2022-09-11 NOTE — Discharge Instructions (Addendum)
Please begin taking your Lasix 20 mg by mouth daily for the next 3-5 days.

## 2022-09-11 NOTE — ED Triage Notes (Signed)
Pt reports going to bed at 0100 and waking up at 1030 this am with right sided facial numbness, pt states that she has a hx of bell's palsy from 10 years ago affecting her right side. Pt states that she is on eliquis for the hx of blood clots that she has. Pt states that she has noticed some swelling to her legs bilat with pain and states that her blood sugar has been up and down lately

## 2022-09-11 NOTE — ED Notes (Addendum)
First Nurse Note: Patient c/o facial numbness on both sides including mouth and tongue. [place] at 1am- woke up at 1030am with symptoms. Hx of bells palsy.  PA assessing patient in triage.

## 2022-09-11 NOTE — ED Notes (Signed)
Pt states is DM 2 and hasn't eaten yet today; pt wondering if she can eat; messaged EDP via secure chat. Awaiting reply.

## 2022-09-11 NOTE — ED Provider Triage Note (Signed)
Emergency Medicine Provider Triage Evaluation Note  RICCI PAFF, a 62 y.o. female  was evaluated in triage.  Pt complains of right-sided facial numbness and left-sided facial weakness.  Patient notes onset last night at about 1:00 this morning.  With history of Bell's palsy and DVTs currently on Eliquis, presents with symptoms that have persisted overnight.  She awoke this morning with a sensation that her tongue was numb her entire mouth was numb, and noting tearing to the left eye.  Denies any vision change, distal paresthesias, or slurred speech.  Review of Systems  Positive: Facial numbness, facial weakness Negative: Vision change, headache  Physical Exam  BP (!) 162/92 (BP Location: Left Arm)   Pulse 85   Temp 98.5 F (36.9 C) (Oral)   Resp 16   Ht '4\' 8"'$  (1.422 m)   Wt 77.1 kg   LMP 07/04/2015 (Approximate) Comment: >2 years ago  SpO2 96%   BMI 38.11 kg/m  Gen:   Awake, no distress   Resp:  Normal effort  MSK:   Moves extremities without difficulty  Other:  Some lid lag noted to the left eye, and downgoing right side of the mouth.  Medical Decision Making  Medically screening exam initiated at 1:51 PM.  Appropriate orders placed.  CHARNESE FEDERICI was informed that the remainder of the evaluation will be completed by another provider, this initial triage assessment does not replace that evaluation, and the importance of remaining in the ED until their evaluation is complete.  Patient to the ED for evaluation of right-sided facial numbness and left-sided facial weakness.  Patient noted onset of symptoms last night, which have persisted through the morning.   Melvenia Needles, PA-C 09/11/22 1354

## 2022-09-13 ENCOUNTER — Encounter: Payer: Self-pay | Admitting: Obstetrics & Gynecology

## 2022-09-20 ENCOUNTER — Telehealth: Payer: Self-pay

## 2022-09-20 NOTE — Telephone Encounter (Signed)
        Patient  visited [facility] on 1/10     Telephone encounter attempt :  1st  .Unable to [place], [facility]  [phone] [address], [place], [place] [address] Phone: [phone] Email: [email or web address]

## 2022-09-23 ENCOUNTER — Telehealth: Payer: Self-pay

## 2022-09-23 NOTE — Telephone Encounter (Signed)
        Patient  visited [place] on 1/10   Telephone encounter attempt :  2nd  Unable to [place]  [phone] [address], [place], [place] [address] Phone: [phone] Email: [email or web address]

## 2022-09-25 ENCOUNTER — Ambulatory Visit: Payer: Medicare Other | Attending: Cardiovascular Disease | Admitting: Cardiovascular Disease

## 2022-09-25 ENCOUNTER — Encounter: Payer: Self-pay | Admitting: Cardiovascular Disease

## 2022-09-25 VITALS — BP 110/70 | HR 78 | Ht <= 58 in | Wt 180.4 lb

## 2022-09-25 DIAGNOSIS — Z86718 Personal history of other venous thrombosis and embolism: Secondary | ICD-10-CM

## 2022-09-25 DIAGNOSIS — R0602 Shortness of breath: Secondary | ICD-10-CM | POA: Insufficient documentation

## 2022-09-25 DIAGNOSIS — E785 Hyperlipidemia, unspecified: Secondary | ICD-10-CM | POA: Insufficient documentation

## 2022-09-25 DIAGNOSIS — I10 Essential (primary) hypertension: Secondary | ICD-10-CM | POA: Insufficient documentation

## 2022-09-25 NOTE — Progress Notes (Signed)
Cardiology Office Note   Date:  09/25/2022   ID:  Katie Baker, DOB 03-30-1961, MRN [id number]  PCP:  Perrin Maltese, MD  Cardiologist:   Kathlyn Sacramento, MD   Chief Complaint  Patient presents with   Other    6 Month f/u c/o body pain, edema bilateral legs/feet pain. Meds reviewed verbally with pt.      History of Present Illness: Katie Baker is a 62 y.o. female who is here today for follow-up visit regarding chest pain.   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 She had multiple hip surgeries in the past and due to that she is not very mobile. She has family history of premature coronary artery disease in multiple family members.  She is not a smoker. Echocardiogram in June 2018 was done in the setting of bilateral pulmonary embolism and showed normal LV systolic function and normal RV systolic function.  She was evaluated in 2018 for chest pain.  Lexiscan Myoview showed no evidence of ischemia with normal ejection fraction. She underwent cardiac CTA in November 2020 due to recurrent chest pain.  The CTA showed a coronary calcium score of 0 with no evidence of coronary artery disease.  Repeat echocardiogram in 2020 showed normal LV systolic function. She had previous ABI in 2021 that was normal. She was most recently seen in our office in April 2022 and was noted to have atypical chest pain. She had an emergency room visit recently for left-sided facial numbness and facial droop.  It was felt that her presentation was consistent with Bell's palsy.  She was treated with prednisone.  She reports worsening exertional dyspnea without chest pain.  Her mobility is overall limited.  She complains of lower extremity edema but reports difficulty applying the lymphedema pump on a daily basis.   Past Medical  History:  Diagnosis Date   Deep vein thrombosis (DVT) (HCC)    Diabetes mellitus, type II (HCC)    Dislocation of hip, congenital    Fibromyalgia    Gout    Hearing loss    left ear   Hyperlipidemia    Hypertension    Hypothyroidism    Obesity    Prediabetes    Pulmonary embolism ([place]) 01/2017   Reflux    Sleep apnea    Spinal stenosis    Tendonitis    Thyroid cancer [facility])     Past Surgical History:  Procedure Laterality Date   COLONOSCOPY WITH PROPOFOL N/A 02/09/2015   Procedure: COLONOSCOPY WITH PROPOFOL;  Surgeon: [name], MD;  Location: [facility] ENDOSCOPY;  Service: Endoscopy;  Laterality: N/A;   COLONOSCOPY WITH PROPOFOL N/A 12/11/2021   Procedure: COLONOSCOPY WITH PROPOFOL;  Surgeon: Lesly Rubenstein, MD;  Location: [facility] ENDOSCOPY;  Service: Endoscopy;  Laterality: N/A;   COLONOSCOPY WITH PROPOFOL N/A 04/01/2022   Procedure: COLONOSCOPY WITH PROPOFOL;  Surgeon: Lesly Rubenstein, MD;  Location: [facility] ENDOSCOPY;  Service: Endoscopy;  Laterality: N/A;  ENGLISH, DM   ESOPHAGOGASTRODUODENOSCOPY N/A 02/09/2015   Procedure: ESOPHAGOGASTRODUODENOSCOPY (EGD);  Surgeon: [name], MD;  Location: [facility] ENDOSCOPY;  Service: Endoscopy;  Laterality: N/A;   ESOPHAGOGASTRODUODENOSCOPY (EGD) WITH PROPOFOL N/A 12/11/2021   Procedure: ESOPHAGOGASTRODUODENOSCOPY (EGD) WITH PROPOFOL;  Surgeon: Lesly Rubenstein, MD;  Location: [facility] ENDOSCOPY;  Service: Endoscopy;  Laterality: N/A;   HIP SURGERY     IVC  FILTER INSERTION N/A 02/14/2017   Procedure: IVC Filter Insertion;  Surgeon: Katha Cabal, MD;  Location: [place] CV LAB;  Service: Cardiovascular;  Laterality: N/A;   IVC FILTER REMOVAL N/A 03/24/2018   Procedure: IVC FILTER REMOVAL;  Surgeon: Katha Cabal, MD;  Location: [place] CV LAB;  Service: Cardiovascular;  Laterality: N/A;   LIMB SPARING RESECTION HIP W/ SADDLE JOINT REPLACEMENT Bilateral    THYROID SURGERY     TOTAL HIP REVISION        Current Outpatient Medications  Medication Sig Dispense Refill   acetaminophen (TYLENOL) 500 MG tablet Take 1,000 mg by mouth every 6 (six) hours as needed for moderate pain or headache.     allopurinol (ZYLOPRIM) 300 MG tablet Take 300 mg by mouth daily.     Biotin 10 MG TABS Take 10 mg by mouth daily.     carboxymethylcellulose 1 % ophthalmic solution Apply 1 drop to eye 3 (three) times daily. 30 mL 1   Cholecalciferol 25 MCG (1000 UT) tablet Take 5,000 Units by mouth daily.     ELIQUIS 5 MG TABS tablet TAKE ONE (1) TABLET BY MOUTH TWO TIMES PER DAY 60 tablet 5   furosemide (LASIX) 20 MG tablet Take 1 tablet (20 mg total) by mouth daily for 5 days. 5 tablet 0   gabapentin (NEURONTIN) 300 MG capsule Take 300 mg by mouth 2 (two) times daily.     glimepiride (AMARYL) 2 MG tablet Take 2 mg by mouth daily with breakfast.      levothyroxine (SYNTHROID, LEVOTHROID) 175 MCG tablet Take 175 mcg by mouth daily before breakfast.  1   lidocaine (LIDODERM) 5 % Place 1 patch onto the skin daily. For pain     linaclotide (LINZESS) 145 MCG CAPS capsule Take 1 capsule by mouth daily.     lisinopril (PRINIVIL,ZESTRIL) 40 MG tablet Take 40 mg by mouth daily.     morphine (MS CONTIN) 15 MG 12 hr tablet Take 1 tablet (15 mg total) by mouth every 12 (twelve) hours. 30 tablet 0   Multiple Vitamins-Minerals (VITAMIN-MINERAL SUPPLEMENT PO) Take by mouth daily. Calcium, Magnesium, and Zinc plus Vitamin D-3     pantoprazole (PROTONIX) 40 MG tablet Take 40 mg by mouth 2 (two) times daily.   5   predniSONE (STERAPRED UNI-PAK 21 TAB) 10 MG (21) TBPK tablet As directed on packaging 1 each 0   Propylene Glycol (SYSTANE BALANCE OP) Apply to eye as directed.     rosuvastatin (CRESTOR) 40 MG tablet Take 40 mg by mouth daily.      Semaglutide (OZEMPIC, 0.25 OR 0.5 MG/DOSE, [place]) Inject into the skin as directed.     VITAMIN E PO Take by mouth daily.     No current facility-administered medications for this visit.     Allergies:   Patient has no known allergies.    Social History:  The patient  reports that she has never smoked. She has never used smokeless tobacco. She reports that she does not drink alcohol and does not use drugs.   Family History:  The patient's family history includes Alcohol abuse in her father; Alzheimer's disease in her mother; Anxiety disorder in her mother; Bladder Cancer in her sister; Breast cancer in her mother; Diabetes in her daughter, father, maternal aunt, and mother; Heart disease in her mother; Hypertension in her mother; Stroke in her mother; Thyroid disease in her brother.    ROS:  Please see the history of present illness.  Otherwise, review of systems are positive for none.   All other systems are reviewed and negative.    PHYSICAL EXAM: VS:  BP 110/70 (BP Location: Left Arm, Patient Position: Sitting, Cuff Size: Large)   Pulse 78   Ht '4\' 8"'$  (1.422 m)   Wt 180 lb 6 oz (81.8 kg)   LMP 07/04/2015 (Approximate) Comment: >2 years ago  SpO2 98%   BMI 40.44 kg/m  , BMI Body mass index is 40.44 kg/m. GEN: Well nourished, well developed, in no acute distress  HEENT: normal  Neck: no JVD, carotid bruits, or masses Cardiac: RRR; no murmurs, rubs, or gallops, mild bilateral leg edema  Respiratory:  clear to auscultation bilaterally, normal work of breathing GI: soft, nontender, nondistended, + BS MS: no deformity or atrophy  Skin: warm and dry, no rash Neuro:  Strength and sensation are intact Psych: euthymic mood, full affect   EKG:  EKG is not ordered today. I reviewed her recent EKG done in the emergency department which showed normal sinus rhythm with no significant ST changes.   Recent Labs: 09/11/2022: ALT 23; BUN 10; Creatinine, Ser 0.58; Hemoglobin 13.9; Platelets 188; Potassium 3.5; Sodium 141    Lipid Panel No results found for: "CHOL", "TRIG", "HDL", "CHOLHDL", "VLDL", "LDLCALC", "LDLDIRECT"    Wt Readings from Last 3 Encounters:  09/25/22  180 lb 6 oz (81.8 kg)  09/11/22 170 lb (77.1 kg)  09/05/22 170 lb (77.1 kg)         07/28/2017    1:48 PM  PAD Screen  Previous PAD dx? No  Previous surgical procedure? No  Pain with walking? No  Feet/toe relief with dangling? No  Painful, non-healing ulcers? No  Extremities discolored? Yes      ASSESSMENT AND PLAN:  1.  Exertional dyspnea: No chest pain at the present time.  I requested an echocardiogram.  Suspect an element of physical deconditioning.   2.  Previous pulmonary embolism and DVT: Given large clot burden and limited mobility, I agree with lifelong anticoagulation as tolerated.  3.  Essential hypertension: Blood pressure is well controlled on current medications.  4.  Hyperlipidemia: Currently on rosuvastatin 40 mg once daily.  Lipid profiles are not available but she did have lipid profile done in 2020 that showed an LDL of 63.    Disposition:   FU with me in 12 months  Signed,  Kathlyn Sacramento, MD  09/25/2022 9:37 AM    [place]

## 2022-09-25 NOTE — Patient Instructions (Signed)
Medication Instructions:  No changes *If you need a refill on your cardiac medications before your next appointment, please call your pharmacy*   Lab Work: None ordered If you have labs (blood work) drawn today and your tests are completely normal, you will receive your results only by: [place] (if you have MyChart) OR A paper copy in the mail If you have any lab test that is abnormal or we need to change your treatment, we will call you to review the results.   Testing/Procedures: Your physician has requested that you have an echocardiogram. Echocardiography is a painless test that uses sound waves to create images of your heart. It provides your doctor with information about the size and shape of your heart and how well your heart's chambers and valves are working.   You may receive an ultrasound enhancing agent through an IV if needed to better visualize your heart during the echo. This procedure takes approximately one hour.  There are no restrictions for this procedure.  This will take place at [facility] ([place]) [address], [place]    Follow-Up: At [facility], you and your health needs are our priority.  As part of our continuing mission to provide you with exceptional heart care, we have created designated Provider Care Teams.  These Care Teams include your primary Cardiologist (physician) and Advanced Practice Providers (APPs -  Physician Assistants and Nurse Practitioners) who all work together to provide you with the care you need, when you need it.  We recommend signing up for the patient portal called "MyChart".  Sign up information is provided on this After Visit Summary.  MyChart is used to connect with patients for Virtual Visits (Telemedicine).  Patients are able to view lab/test results, encounter notes, upcoming appointments, etc.  Non-urgent messages can be sent to your provider as well.   To learn more about what you can  do with MyChart, go to [email or web address].    Your next appointment:   12 month(s)  Provider:   You may see Kathlyn Sacramento, MD or one of the following Advanced Practice Providers on your designated Care Team:   Murray Hodgkins, NP Christell Faith, PA-C Cadence Kathlen Mody, PA-C Gerrie Nordmann, NP

## 2022-10-10 ENCOUNTER — Telehealth (INDEPENDENT_AMBULATORY_CARE_PROVIDER_SITE_OTHER): Payer: Self-pay | Admitting: Nurse Practitioner

## 2022-10-10 ENCOUNTER — Ambulatory Visit: Payer: Self-pay | Admitting: Nurse Practitioner

## 2022-10-10 NOTE — Telephone Encounter (Signed)
Patient was made aware with medical recommendations and verbalized understanding 

## 2022-10-10 NOTE — Telephone Encounter (Signed)
Patient called and left a voice message asking if it was ok for her to get a tattoo.  She wants to know if she should stop her Eliquis before getting it.  Please advise.

## 2022-10-10 NOTE — Telephone Encounter (Signed)
I wouldn't recommend getting a tattoo on her legs but arms is ok.  She can continue eliquis from our perspective unless the tattoo parlor recommends holding.

## 2022-10-14 ENCOUNTER — Encounter: Payer: Self-pay | Admitting: Obstetrics & Gynecology

## 2022-10-14 ENCOUNTER — Ambulatory Visit (INDEPENDENT_AMBULATORY_CARE_PROVIDER_SITE_OTHER): Payer: Medicare Other | Admitting: Obstetrics & Gynecology

## 2022-10-14 VITALS — BP 144/82 | HR 78 | Ht <= 58 in | Wt 178.2 lb

## 2022-10-14 DIAGNOSIS — Z7689 Persons encountering health services in other specified circumstances: Secondary | ICD-10-CM

## 2022-10-14 DIAGNOSIS — R9389 Abnormal findings on diagnostic imaging of other specified body structures: Secondary | ICD-10-CM

## 2022-10-14 NOTE — Progress Notes (Signed)
   Established Patient Office Visit  Subjective   Patient ID: Katie Baker, female    DOB: Oct 29, 1960  Age: 62 y.o. MRN: [id number]  Chief Complaint  Patient presents with   Endometrial thickening    HPI   62 yo married (with a wife) here as a new patient. She is referred because her MRI done 06/2022 for back issues, showed an 8 mm endometrium. She denies PMB. She says that she cannot have pelvic exam in the office and her last pap was done in the OR for this reason. She denies a h/o abnormal paps or HR HPV. She does not have penetrative sex.  Objective:     BP (!) 144/82   Pulse 78   Ht '4\' 8"'$  (1.422 m)   Wt 178 lb 3.2 oz (80.8 kg)   LMP 07/04/2015 (Approximate) Comment: >2 years ago  BMI 39.95 kg/m    Physical Exam   Well nourished, well hydrated [place], no apparent distress  She is in a Ambulance person. She is hard of hearing and has some facial paralysis due to a Bell's Palsy.  Assessment & Plan:  Thickened endometrium 8 mm. The cut off for EMBX with NO PMB is 10 mm.  I have counseled her that if she has PMB, this needs to be evaluated immediately. I have discussed her relatively low risk of cervical cancer but told her that when she decides that she is ready for a pap smear/cervical cancer screening, she can have an appt for consult for EUA.  Problem List Items Addressed This Visit   None Visit Diagnoses     Establishing care with new doctor, encounter for    -  Primary   Thickened endometrium       Screening for cervical cancer           No follow-ups on file.    Emily Filbert, MD

## 2022-10-24 ENCOUNTER — Ambulatory Visit (INDEPENDENT_AMBULATORY_CARE_PROVIDER_SITE_OTHER): Payer: Medicare Other | Admitting: Nurse Practitioner

## 2022-10-24 ENCOUNTER — Encounter: Payer: Self-pay | Admitting: Nurse Practitioner

## 2022-10-24 VITALS — BP 174/96 | HR 91 | Ht <= 58 in | Wt 178.0 lb

## 2022-10-24 DIAGNOSIS — E119 Type 2 diabetes mellitus without complications: Secondary | ICD-10-CM | POA: Diagnosis not present

## 2022-10-24 DIAGNOSIS — G510 Bell's palsy: Secondary | ICD-10-CM

## 2022-10-24 DIAGNOSIS — M797 Fibromyalgia: Secondary | ICD-10-CM

## 2022-10-24 DIAGNOSIS — G4733 Obstructive sleep apnea (adult) (pediatric): Secondary | ICD-10-CM | POA: Diagnosis not present

## 2022-10-24 DIAGNOSIS — G8929 Other chronic pain: Secondary | ICD-10-CM

## 2022-10-24 DIAGNOSIS — I872 Venous insufficiency (chronic) (peripheral): Secondary | ICD-10-CM | POA: Diagnosis not present

## 2022-10-24 DIAGNOSIS — M5090 Cervical disc disorder, unspecified, unspecified cervical region: Secondary | ICD-10-CM | POA: Diagnosis not present

## 2022-10-24 DIAGNOSIS — I10 Essential (primary) hypertension: Secondary | ICD-10-CM

## 2022-10-24 DIAGNOSIS — H9201 Otalgia, right ear: Secondary | ICD-10-CM

## 2022-10-24 DIAGNOSIS — M545 Low back pain, unspecified: Secondary | ICD-10-CM

## 2022-10-24 MED ORDER — AZITHROMYCIN 250 MG PO TABS: ORAL_TABLET | ORAL | 0 refills | Status: DC

## 2022-10-24 MED ORDER — LISINOPRIL 40 MG PO TABS: 40.0000 mg | ORAL_TABLET | Freq: Every day | ORAL | 1 refills | Status: DC

## 2022-10-24 MED ORDER — VALACYCLOVIR HCL 1 G PO TABS: 1000.0000 mg | ORAL_TABLET | Freq: Two times a day (BID) | ORAL | 0 refills | Status: DC

## 2022-10-24 NOTE — Addendum Note (Signed)
Addended by: Evern Bio on: 10/24/2022 04:07 PM   Modules accepted: Orders

## 2022-10-24 NOTE — Patient Instructions (Addendum)
1) ENT referral - right ear with foreign body 2) Return in 2 weeks, BP recheck

## 2022-10-24 NOTE — Progress Notes (Signed)
Established Patient Office Visit  Subjective:  Patient ID: Katie Baker, female    DOB: Nov 18, 1960  Age: 62 y.o. MRN: [id number]  Chief Complaint  Patient presents with   Follow-up    2 weeks follow up    Follow up.  Recent left sided Bell's Palsy, received pred x 5 days, did MRI.  She also went to eye provider for eye drops.  BP is elevated, pt reports she took her lisinopril this AM.  Bilateral ear pain.     Past Medical History:  Diagnosis Date   Deep vein thrombosis (DVT) (HCC)    Diabetes mellitus, type II (HCC)    Dislocation of hip, congenital    Fibromyalgia    Gout    Hearing loss    left ear   Hyperlipidemia    Hypertension    Hypothyroidism    Obesity    Prediabetes    Pulmonary embolism ([place]) 01/2017   Reflux    Sleep apnea    Spinal stenosis    Tendonitis    Thyroid cancer ([place])     Social History   Socioeconomic History   Marital status: Divorced    Spouse name: Not on file   Number of children: Not on file   Years of education: Not on file   Highest education level: Not on file  Occupational History   Not on file  Tobacco Use   Smoking status: Never   Smokeless tobacco: Never  Vaping Use   Vaping Use: Never used  Substance and Sexual Activity   Alcohol use: No    Comment: socially   Drug use: No   Sexual activity: Yes    Birth control/protection: Post-menopausal  Other Topics Concern   Not on file  Social History Narrative   Not on file   Social Determinants of Health   Financial Resource Strain: Not on file  Food Insecurity: Not on file  Transportation Needs: Not on file  Physical Activity: Not on file  Stress: Not on file  Social Connections: Not on file  Intimate Partner Violence: Not on file    Family History  Problem Relation Age of Onset   Heart disease Mother    Hypertension Mother    Alzheimer's disease Mother    Diabetes Mother    Stroke Mother    Anxiety disorder Mother    Breast cancer Mother        at  13 years   Diabetes Father    Alcohol abuse Father    Bladder Cancer Sister    Thyroid disease Brother    Diabetes Daughter    Diabetes Maternal Aunt     No Known Allergies  Review of Systems  Constitutional: Negative.   HENT:  Positive for hearing loss, sore throat and tinnitus.   Eyes: Negative.   Respiratory: Negative.    Cardiovascular:  Positive for leg swelling.  Gastrointestinal: Negative.   Genitourinary: Negative.   Musculoskeletal:  Positive for back pain, joint pain and neck pain.  Skin: Negative.   Neurological:  Positive for weakness and headaches.  Endo/Heme/Allergies: Negative.   Psychiatric/Behavioral:  Positive for depression. The patient has insomnia.        Objective:   BP (!) 174/96   Pulse 91   Ht 4' 8"$  (1.422 m)   Wt 178 lb (80.7 kg)   LMP 07/04/2015 (Approximate) Comment: >2 years ago  SpO2 96%   BMI 39.91 kg/m   Vitals:   10/24/22 0958  BP: Marland Kitchen)  174/96  Pulse: 91  Height: 4' 8"$  (1.422 m)  Weight: 178 lb (80.7 kg)  SpO2: 96%  BMI (Calculated): 39.93    Physical Exam Vitals reviewed.  Constitutional:      Appearance: Normal appearance.  HENT:     Head: Normocephalic.     Nose: Nose normal.     Mouth/Throat:     Mouth: Mucous membranes are dry.  Eyes:     Pupils: Pupils are equal, round, and reactive to light.  Cardiovascular:     Rate and Rhythm: Normal rate and regular rhythm.  Pulmonary:     Effort: Pulmonary effort is normal.     Breath sounds: Normal breath sounds.  Abdominal:     General: Bowel sounds are normal.     Palpations: Abdomen is soft.  Musculoskeletal:     Right lower leg: Edema present.     Left lower leg: Edema present.  Lymphadenopathy:     Cervical: Cervical adenopathy present.  Skin:    General: Skin is warm and dry.  Neurological:     Mental Status: She is alert and oriented to person, place, and time.  Psychiatric:        Mood and Affect: Mood normal.        Behavior: Behavior normal.       No results found for any visits on 10/24/22.  Recent Results (from the past 2160 hour(s))  Comprehensive metabolic panel     Status: Abnormal   Collection Time: 09/11/22  1:00 PM  Result Value Ref Range   Sodium 141 135 - 145 mmol/L   Potassium 3.5 3.5 - 5.1 mmol/L   Chloride 107 98 - 111 mmol/L   CO2 27 22 - 32 mmol/L   Glucose, Bld 201 (H) 70 - 99 mg/dL    Comment: Glucose reference range applies only to samples taken after fasting for at least 8 hours.   BUN 10 8 - 23 mg/dL   Creatinine, Ser 0.58 0.44 - 1.00 mg/dL   Calcium 8.9 8.9 - 10.3 mg/dL   Total Protein 7.2 6.5 - 8.1 g/dL   Albumin 3.8 3.5 - 5.0 g/dL   AST 24 15 - 41 U/L   ALT 23 0 - 44 U/L   Alkaline Phosphatase 77 38 - 126 U/L   Total Bilirubin 0.5 0.3 - 1.2 mg/dL   GFR, Estimated >60 >60 mL/min    Comment: (NOTE) Calculated using the CKD-EPI Creatinine Equation (2021)    Anion gap 7 5 - 15    Comment: Performed at [facility], [address] [place], [place] [address]  CBC     Status: None   Collection Time: 09/11/22  1:00 PM  Result Value Ref Range   WBC 7.1 4.0 - 10.5 K/uL   RBC 4.64 3.87 - 5.11 MIL/uL   Hemoglobin 13.9 12.0 - 15.0 g/dL   HCT 42.7 36.0 - 46.0 %   MCV 92.0 80.0 - 100.0 fL   MCH 30.0 26.0 - 34.0 pg   MCHC 32.6 30.0 - 36.0 g/dL   RDW 12.3 11.5 - 15.5 %   Platelets 188 150 - 400 K/uL   nRBC 0.0 0.0 - 0.2 %    Comment: Performed at [facility], [address] [place], [place] [address]  Troponin I (High Sensitivity)     Status: None   Collection Time: 09/11/22  1:00 PM  Result Value Ref Range   Troponin I (High Sensitivity) 3 <18 ng/L    Comment: (  NOTE) Elevated high sensitivity troponin I (hsTnI) values and significant  changes across serial measurements may suggest ACS but many other  chronic and acute conditions are known to elevate hsTnI results.  Refer to the "Links" section for chest pain algorithms and additional  guidance. Performed at [facility], [place], [place] [address]   Troponin I (High Sensitivity)     Status: None   Collection Time: 09/11/22  5:18 PM  Result Value Ref Range   Troponin I (High Sensitivity) 4 <18 ng/L    Comment: (NOTE) Elevated high sensitivity troponin I (hsTnI) values and significant  changes across serial measurements may suggest ACS but many other  chronic and acute conditions are known to elevate hsTnI results.  Refer to the "Links" section for chest pain algorithms and additional  guidance. Performed at [facility], [address] [place], [place] [address]   CBG monitoring, ED     Status: None   Collection Time: 09/11/22  5:23 PM  Result Value Ref Range   Glucose-Capillary 98 70 - 99 mg/dL    Comment: Glucose reference range applies only to samples taken after fasting for at least 8 hours.      Assessment & Plan:   Problem List Items Addressed This Visit       Cardiovascular and Mediastinum   Chronic venous insufficiency   Relevant Medications   lisinopril (ZESTRIL) 40 MG tablet     Respiratory   Obstructive apnea - Primary     Endocrine   Type 2 diabetes mellitus without complication, without long-term current use of insulin (HCC)   Relevant Medications   lisinopril (ZESTRIL) 40 MG tablet     Nervous and Auditory   Bell's palsy   Relevant Medications   valACYclovir (VALTREX) 1000 MG tablet     Musculoskeletal and Integument   Cervical disc disease     Other   Fibromyalgia   Low back pain   Other Visit Diagnoses     Essential hypertension, benign       Relevant Medications   lisinopril (ZESTRIL) 40 MG tablet   Otalgia of right ear       Relevant Medications   azithromycin (ZITHROMAX Z-PAK) 250 MG tablet   Other Relevant Orders   Ambulatory referral to ENT       Return in about 2 weeks (around 11/07/2022).   Total time spent: 30 minutes  Evern Bio, NP  10/24/2022

## 2022-10-25 MED ORDER — AZITHROMYCIN 250 MG PO TABS: ORAL_TABLET | ORAL | 0 refills | Status: AC

## 2022-10-25 MED ORDER — LISINOPRIL 40 MG PO TABS: 40.0000 mg | ORAL_TABLET | Freq: Every day | ORAL | 1 refills | Status: DC

## 2022-10-25 NOTE — Addendum Note (Signed)
Addended by: Evern Bio on: 10/25/2022 08:53 AM   Modules accepted: Orders

## 2022-10-27 NOTE — Progress Notes (Deleted)
MRN : [id number]  Katie Baker is a 62 y.o. (03/20/1961) female who presents with chief complaint of legs swell.  History of Present Illness:   The patient presents to the office for evaluation of leg pain and swelling.     DVT was identified at [facility] by Duplex ultrasound in June 2018.  The initial symptoms were SOB secondary to her PE's associated with  pain and swelling in the lower extremity.   The IVC filter was removed  03/24/2018.   Patient states her swelling and leg pain have been increasing.  It is now a problem all the time.  Also having leg cramps.  Overall she feels that her pain is substantially worse and this has prompted her return to the office.   The patient has not been using compression therapy at this point because she is not able to put the stockings on.  She has not been using her Lymph pump stating it isn't working properly and is very worn.  It is 63 years old she states.  She is now spending the majority of her time in a wheelchair because of her disability.  She continues to use a hospital bed and elevates her feet at night.   No SOB or pleuritic chest pains.  No cough or hemoptysis.   No blood per rectum or blood in any sputum.  No excessive bruising per the patient.  ABIs obtained today demonstrate normal waveforms all of them are triphasic with normal TBI's.  Patient would not allow for a cuff to be placed on the ankle.  Venous duplex demonstrates her deep veins are all patent and compressible bilaterally.  No evidence of reflux is identified.  No outpatient medications have been marked as taking for the 10/28/22 encounter (Appointment) with Delana Meyer, Dolores Lory, MD.    Past Medical History:  Diagnosis Date   Deep vein thrombosis (DVT) ([place])    Diabetes mellitus, type II ([place])    Dislocation of hip, congenital    Fibromyalgia    Gout    Hearing loss    left ear   Hyperlipidemia    Hypertension    Hypothyroidism    Obesity    Prediabetes     Pulmonary embolism ([place]) 01/2017   Reflux    Sleep apnea    Spinal stenosis    Tendonitis    Thyroid cancer [facility])     Past Surgical History:  Procedure Laterality Date   COLONOSCOPY WITH PROPOFOL N/A 02/09/2015   Procedure: COLONOSCOPY WITH PROPOFOL;  Surgeon: [name], MD;  Location: [facility] ENDOSCOPY;  Service: Endoscopy;  Laterality: N/A;   COLONOSCOPY WITH PROPOFOL N/A 12/11/2021   Procedure: COLONOSCOPY WITH PROPOFOL;  Surgeon: Lesly Rubenstein, MD;  Location: [facility] ENDOSCOPY;  Service: Endoscopy;  Laterality: N/A;   COLONOSCOPY WITH PROPOFOL N/A 04/01/2022   Procedure: COLONOSCOPY WITH PROPOFOL;  Surgeon: Lesly Rubenstein, MD;  Location: [facility] ENDOSCOPY;  Service: Endoscopy;  Laterality: N/A;  ENGLISH, DM   ESOPHAGOGASTRODUODENOSCOPY N/A 02/09/2015   Procedure: ESOPHAGOGASTRODUODENOSCOPY (EGD);  Surgeon: [name], MD;  Location: [facility] ENDOSCOPY;  Service: Endoscopy;  Laterality: N/A;   ESOPHAGOGASTRODUODENOSCOPY (EGD) WITH PROPOFOL N/A 12/11/2021   Procedure: ESOPHAGOGASTRODUODENOSCOPY (EGD) WITH PROPOFOL;  Surgeon: Lesly Rubenstein, MD;  Location: [facility] ENDOSCOPY;  Service: Endoscopy;  Laterality: N/A;   HIP SURGERY     IVC FILTER INSERTION N/A 02/14/2017   Procedure: IVC Filter Insertion;  Surgeon: Katha Cabal, MD;  Location: [place] INVASIVE CV  LAB;  Service: Cardiovascular;  Laterality: N/A;   IVC FILTER REMOVAL N/A 03/24/2018   Procedure: IVC FILTER REMOVAL;  Surgeon: Katha Cabal, MD;  Location: [place] CV LAB;  Service: Cardiovascular;  Laterality: N/A;   LIMB SPARING RESECTION HIP W/ SADDLE JOINT REPLACEMENT Bilateral    THYROID SURGERY     TOTAL HIP REVISION      Social History Social History   Tobacco Use   Smoking status: Never   Smokeless tobacco: Never  Vaping Use   Vaping Use: Never used  Substance Use Topics   Alcohol use: No    Comment: socially   Drug use: No    Family History Family History  Problem Relation Age of  Onset   Heart disease Mother    Hypertension Mother    Alzheimer's disease Mother    Diabetes Mother    Stroke Mother    Anxiety disorder Mother    Breast cancer Mother        at 58 years   Diabetes Father    Alcohol abuse Father    Bladder Cancer Sister    Thyroid disease Brother    Diabetes Daughter    Diabetes Maternal Aunt     No Known Allergies   REVIEW OF SYSTEMS (Negative unless checked)  Constitutional: '[]'$ Weight loss  '[]'$ Fever  '[]'$ Chills Cardiac: '[]'$ Chest pain   '[]'$ Chest pressure   '[]'$ Palpitations   '[]'$ Shortness of breath when laying flat   '[]'$ Shortness of breath with exertion. Vascular:  '[]'$ Pain in legs with walking   '[x]'$ Pain in legs with standing  '[]'$ History of DVT   '[]'$ Phlebitis   '[x]'$ Swelling in legs   '[]'$ Varicose veins   '[]'$ Non-healing ulcers Pulmonary:   '[]'$ Uses home oxygen   '[]'$ Productive cough   '[]'$ Hemoptysis   '[]'$ Wheeze  '[]'$ COPD   '[]'$ Asthma Neurologic:  '[]'$ Dizziness   '[]'$ Seizures   '[]'$ History of stroke   '[]'$ History of TIA  '[]'$ Aphasia   '[]'$ Vissual changes   '[]'$ Weakness or numbness in arm   '[]'$ Weakness or numbness in leg Musculoskeletal:   '[]'$ Joint swelling   '[]'$ Joint pain   '[]'$ Low back pain Hematologic:  '[]'$ Easy bruising  '[]'$ Easy bleeding   '[]'$ Hypercoagulable state   '[]'$ Anemic Gastrointestinal:  '[]'$ Diarrhea   '[]'$ Vomiting  '[]'$ Gastroesophageal reflux/heartburn   '[]'$ Difficulty swallowing. Genitourinary:  '[]'$ Chronic kidney disease   '[]'$ Difficult urination  '[]'$ Frequent urination   '[]'$ Blood in urine Skin:  '[]'$ Rashes   '[]'$ Ulcers  Psychological:  '[]'$ History of anxiety   '[]'$  History of major depression.  Physical Examination  There were no vitals filed for this visit. There is no height or weight on file to calculate BMI. Gen: WD/WN, NAD Head: [place]/AT, No temporalis wasting.  Ear/Nose/Throat: Hearing grossly intact, nares w/o erythema or drainage, pinna without lesions Eyes: PER, EOMI, sclera nonicteric.  Neck: Supple, no gross masses.  No JVD.  Pulmonary:  Good air movement, no audible wheezing, no use of  accessory muscles.  Cardiac: RRR, precordium not hyperdynamic. Vascular:  scattered varicosities present bilaterally.  Mild venous stasis changes to the legs bilaterally.  3-4+ soft pitting edema, CEAP C4sEpAsPr  Vessel Right Left  Radial Palpable Palpable  Gastrointestinal: soft, non-distended. No guarding/no peritoneal signs.  Musculoskeletal: M/S 5/5 throughout.  No deformity.  Neurologic: CN 2-12 intact. Pain and light touch intact in extremities.  Symmetrical.  Speech is fluent. Motor exam as listed above. Psychiatric: Judgment intact, Mood & affect appropriate for pt's clinical situation. Dermatologic: Venous rashes no ulcers noted.  No changes consistent with cellulitis. Lymph : No lichenification or skin  changes of chronic lymphedema.  CBC Lab Results  Component Value Date   WBC 7.1 09/11/2022   HGB 13.9 09/11/2022   HCT 42.7 09/11/2022   MCV 92.0 09/11/2022   PLT 188 09/11/2022    BMET    Component Value Date/Time   NA 141 09/11/2022 1300   NA 141 10/01/2011 0721   K 3.5 09/11/2022 1300   K 3.6 10/01/2011 0721   CL 107 09/11/2022 1300   CL 104 10/01/2011 0721   CO2 27 09/11/2022 1300   CO2 28 10/01/2011 0721   GLUCOSE 201 (H) 09/11/2022 1300   GLUCOSE 111 (H) 10/01/2011 0721   BUN 10 09/11/2022 1300   BUN 10 10/01/2011 0721   CREATININE 0.58 09/11/2022 1300   CREATININE 0.51 (L) 10/01/2011 0721   CALCIUM 8.9 09/11/2022 1300   CALCIUM 9.1 10/01/2011 0721   GFRNONAA >60 09/11/2022 1300   GFRNONAA >60 10/01/2011 0721   GFRAA >60 11/24/2019 1001   GFRAA >60 10/01/2011 0721   CrCl cannot be calculated (Patient's most recent lab result is older than the maximum 21 days allowed.).  COAG Lab Results  Component Value Date   INR 0.96 02/12/2017    Radiology No results found.   Assessment/Plan There are no diagnoses linked to this encounter.   Hortencia Pilar, MD  10/27/2022 11:09 AM

## 2022-10-28 ENCOUNTER — Ambulatory Visit (INDEPENDENT_AMBULATORY_CARE_PROVIDER_SITE_OTHER): Payer: Medicare Other | Admitting: Vascular Surgery

## 2022-10-28 ENCOUNTER — Encounter (INDEPENDENT_AMBULATORY_CARE_PROVIDER_SITE_OTHER): Payer: Self-pay | Admitting: Vascular Surgery

## 2022-10-28 VITALS — BP 189/88 | HR 87 | Resp 16 | Ht <= 58 in | Wt 180.0 lb

## 2022-10-28 DIAGNOSIS — E119 Type 2 diabetes mellitus without complications: Secondary | ICD-10-CM

## 2022-10-28 DIAGNOSIS — I890 Lymphedema, not elsewhere classified: Secondary | ICD-10-CM | POA: Diagnosis not present

## 2022-10-28 DIAGNOSIS — E782 Mixed hyperlipidemia: Secondary | ICD-10-CM

## 2022-10-28 DIAGNOSIS — I87009 Postthrombotic syndrome without complications of unspecified extremity: Secondary | ICD-10-CM

## 2022-10-28 DIAGNOSIS — I872 Venous insufficiency (chronic) (peripheral): Secondary | ICD-10-CM | POA: Diagnosis not present

## 2022-10-28 DIAGNOSIS — I10 Essential (primary) hypertension: Secondary | ICD-10-CM | POA: Diagnosis not present

## 2022-10-28 MED ORDER — APIXABAN 5 MG PO TABS: 5.0000 mg | ORAL_TABLET | Freq: Two times a day (BID) | ORAL | 3 refills | Status: DC

## 2022-10-28 NOTE — Progress Notes (Signed)
MRN : [id number]  Katie Baker is a 62 y.o. (September 02, 1961) female who presents with chief complaint of legs hurt and swell.  History of Present Illness:   The patient presents to the office for evaluation of leg pain and swelling.     DVT was identified at [facility] by Duplex ultrasound in June 2018.  The initial symptoms were SOB secondary to her PE's associated with  pain and swelling in the lower extremity.   The IVC filter was removed  03/24/2018.   Patient states her swelling and leg pain have been increasing.  It is now a problem all the time.  Also having leg cramps.  Overall she feels that her pain is substantially worse and this has prompted her return to the office.   The patient has not been using compression therapy at this point because she is not able to put the stockings on.  She has not been using her Lymph pump.  She is now spending the majority of her time in a wheelchair because of her disability.  She continues to use a hospital bed and elevates her feet at night.   No SOB or pleuritic chest pains.  No cough or hemoptysis.   No blood per rectum or blood in any sputum.  No excessive bruising per the patient.  ABIs obtained today demonstrate normal waveforms all of them are triphasic with normal TBI's.  Patient would not allow for a cuff to be placed on the ankle.  Venous duplex demonstrates her deep veins are all patent and compressible bilaterally.  No evidence of reflux is identified.  Current Meds  Medication Sig   acetaminophen (TYLENOL) 500 MG tablet Take 1,000 mg by mouth every 6 (six) hours as needed for moderate pain or headache.   allopurinol (ZYLOPRIM) 300 MG tablet Take 300 mg by mouth daily.   azithromycin (ZITHROMAX Z-PAK) 250 MG tablet Take 2 tablets (500 mg) on  Day 1,  followed by 1 tablet (250 mg) once daily on Days 2 through 5.   Biotin 10 MG TABS Take 10 mg by mouth daily.   carboxymethylcellulose 1 % ophthalmic solution Apply 1 drop to eye  3 (three) times daily.   ELIQUIS 5 MG TABS tablet TAKE ONE (1) TABLET BY MOUTH TWO TIMES PER DAY   gabapentin (NEURONTIN) 300 MG capsule Take 300 mg by mouth 2 (two) times daily.   glimepiride (AMARYL) 2 MG tablet Take 2 mg by mouth daily with breakfast.    levothyroxine (SYNTHROID, LEVOTHROID) 175 MCG tablet Take 175 mcg by mouth daily before breakfast.   lidocaine (LIDODERM) 5 % Place 1 patch onto the skin daily. For pain   linaclotide (LINZESS) 145 MCG CAPS capsule Take 1 capsule by mouth daily.   lisinopril (ZESTRIL) 40 MG tablet Take 1 tablet (40 mg total) by mouth daily.   morphine (MS CONTIN) 15 MG 12 hr tablet Take 1 tablet (15 mg total) by mouth every 12 (twelve) hours.   Multiple Vitamins-Minerals (VITAMIN-MINERAL SUPPLEMENT PO) Take by mouth daily. Calcium, Magnesium, and Zinc plus Vitamin D-3   pantoprazole (PROTONIX) 40 MG tablet Take 40 mg by mouth 2 (two) times daily.    Propylene Glycol (SYSTANE BALANCE OP) Apply to eye as directed.   rosuvastatin (CRESTOR) 40 MG tablet Take 40 mg by mouth daily.    Semaglutide (OZEMPIC, 0.25 OR 0.5 MG/DOSE, [place]) Inject into the skin as directed.   valACYclovir (VALTREX) 1000  MG tablet Take 1 tablet (1,000 mg total) by mouth 2 (two) times daily.   VITAMIN E PO Take by mouth daily.    Past Medical History:  Diagnosis Date   Deep vein thrombosis (DVT) ([facility])    Diabetes mellitus, type II ([facility])    Dislocation of hip, congenital    Fibromyalgia    Gout    Hearing loss    left ear   Hyperlipidemia    Hypertension    Hypothyroidism    Obesity    Prediabetes    Pulmonary embolism ([place]) 01/2017   Reflux    Sleep apnea    Spinal stenosis    Tendonitis    Thyroid cancer [facility])     Past Surgical History:  Procedure Laterality Date   COLONOSCOPY WITH PROPOFOL N/A 02/09/2015   Procedure: COLONOSCOPY WITH PROPOFOL;  Surgeon: [name], MD;  Location: [facility] ENDOSCOPY;  Service: Endoscopy;  Laterality: N/A;   COLONOSCOPY WITH PROPOFOL  N/A 12/11/2021   Procedure: COLONOSCOPY WITH PROPOFOL;  Surgeon: Lesly Rubenstein, MD;  Location: [facility] ENDOSCOPY;  Service: Endoscopy;  Laterality: N/A;   COLONOSCOPY WITH PROPOFOL N/A 04/01/2022   Procedure: COLONOSCOPY WITH PROPOFOL;  Surgeon: Lesly Rubenstein, MD;  Location: [facility] ENDOSCOPY;  Service: Endoscopy;  Laterality: N/A;  ENGLISH, DM   ESOPHAGOGASTRODUODENOSCOPY N/A 02/09/2015   Procedure: ESOPHAGOGASTRODUODENOSCOPY (EGD);  Surgeon: [name], MD;  Location[facility] ENDOSCOPY;  Service: Endoscopy;  Laterality: N/A;   ESOPHAGOGASTRODUODENOSCOPY (EGD) WITH PROPOFOL N/A 12/11/2021   Procedure: ESOPHAGOGASTRODUODENOSCOPY (EGD) WITH PROPOFOL;  Surgeon: Lesly Rubenstein, MD;  Location: [facility] ENDOSCOPY;  Service: Endoscopy;  Laterality: N/A;   HIP SURGERY     IVC FILTER INSERTION N/A 02/14/2017   Procedure: IVC Filter Insertion;  Surgeon: Katha Cabal, MD;  Location: [place] CV LAB;  Service: Cardiovascular;  Laterality: N/A;   IVC FILTER REMOVAL N/A 03/24/2018   Procedure: IVC FILTER REMOVAL;  Surgeon: Katha Cabal, MD;  Location: [place] CV LAB;  Service: Cardiovascular;  Laterality: N/A;   LIMB SPARING RESECTION HIP W/ SADDLE JOINT REPLACEMENT Bilateral    THYROID SURGERY     TOTAL HIP REVISION      Social History Social History   Tobacco Use   Smoking status: Never   Smokeless tobacco: Never  Vaping Use   Vaping Use: Never used  Substance Use Topics   Alcohol use: No    Comment: socially   Drug use: No    Family History Family History  Problem Relation Age of Onset   Heart disease Mother    Hypertension Mother    Alzheimer's disease Mother    Diabetes Mother    Stroke Mother    Anxiety disorder Mother    Breast cancer Mother        at 93 years   Diabetes Father    Alcohol abuse Father    Bladder Cancer Sister    Thyroid disease Brother    Diabetes Daughter    Diabetes Maternal Aunt     No Known Allergies   REVIEW OF SYSTEMS  (Negative unless checked)  Constitutional: '[]'$ Weight loss  '[]'$ Fever  '[]'$ Chills Cardiac: '[]'$ Chest pain   '[]'$ Chest pressure   '[]'$ Palpitations   '[]'$ Shortness of breath when laying flat   '[]'$ Shortness of breath with exertion. Vascular:  '[]'$ Pain in legs with walking   '[x]'$ Pain in legs at rest  '[]'$ History of DVT   '[]'$ Phlebitis   '[x]'$ Swelling in legs   '[]'$ Varicose veins   '[]'$ Non-healing ulcers Pulmonary:   '[]'$   Uses home oxygen   '[]'$ Productive cough   '[]'$ Hemoptysis   '[]'$ Wheeze  '[]'$ COPD   '[]'$ Asthma Neurologic:  '[]'$ Dizziness   '[]'$ Seizures   '[]'$ History of stroke   '[]'$ History of TIA  '[]'$ Aphasia   '[]'$ Vissual changes   '[]'$ Weakness or numbness in arm   '[]'$ Weakness or numbness in leg Musculoskeletal:   '[]'$ Joint swelling   '[]'$ Joint pain   '[]'$ Low back pain Hematologic:  '[]'$ Easy bruising  '[]'$ Easy bleeding   '[]'$ Hypercoagulable state   '[]'$ Anemic Gastrointestinal:  '[]'$ Diarrhea   '[]'$ Vomiting  '[]'$ Gastroesophageal reflux/heartburn   '[]'$ Difficulty swallowing. Genitourinary:  '[]'$ Chronic kidney disease   '[]'$ Difficult urination  '[]'$ Frequent urination   '[]'$ Blood in urine Skin:  '[]'$ Rashes   '[]'$ Ulcers  Psychological:  '[]'$ History of anxiety   '[]'$  History of major depression.  Physical Examination  Vitals:   10/28/22 1333  BP: (!) 189/88  Pulse: 87  Resp: 16  Weight: 180 lb (81.6 kg)  Height: '4\' 8"'$  (1.422 m)   Body mass index is 40.36 kg/m. Gen: WD/WN, NAD walking with a walker Head: [place]/AT, No temporalis wasting.  Ear/Nose/Throat: Hearing grossly intact, nares w/o erythema or drainage, pinna without lesions Eyes: PER, EOMI, sclera nonicteric.  Neck: Supple, no gross masses.  No JVD.  Pulmonary:  Good air movement, no audible wheezing, no use of accessory muscles.  Cardiac: RRR, precordium not hyperdynamic. Vascular:  scattered varicosities present bilaterally.  Moderate venous stasis changes to the legs bilaterally.  2-3+ soft pitting edema. CEAP C4sEpAsPr   Vessel Right Left  Radial Palpable Palpable  Gastrointestinal: soft, non-distended. No guarding/no  peritoneal signs.  Musculoskeletal: M/S 5/5 throughout.  No deformity.  Neurologic: CN 2-12 intact. Pain and light touch intact in extremities.  Symmetrical.  Speech is fluent. Motor exam as listed above. Psychiatric: Judgment intact, Mood & affect appropriate for pt's clinical situation. Dermatologic: Venous rashes no ulcers noted.  No changes consistent with cellulitis. Lymph : No lichenification or skin changes of chronic lymphedema.  CBC Lab Results  Component Value Date   WBC 7.1 09/11/2022   HGB 13.9 09/11/2022   HCT 42.7 09/11/2022   MCV 92.0 09/11/2022   PLT 188 09/11/2022    BMET    Component Value Date/Time   NA 141 09/11/2022 1300   NA 141 10/01/2011 0721   K 3.5 09/11/2022 1300   K 3.6 10/01/2011 0721   CL 107 09/11/2022 1300   CL 104 10/01/2011 0721   CO2 27 09/11/2022 1300   CO2 28 10/01/2011 0721   GLUCOSE 201 (H) 09/11/2022 1300   GLUCOSE 111 (H) 10/01/2011 0721   BUN 10 09/11/2022 1300   BUN 10 10/01/2011 0721   CREATININE 0.58 09/11/2022 1300   CREATININE 0.51 (L) 10/01/2011 0721   CALCIUM 8.9 09/11/2022 1300   CALCIUM 9.1 10/01/2011 0721   GFRNONAA >60 09/11/2022 1300   GFRNONAA >60 10/01/2011 0721   GFRAA >60 11/24/2019 1001   GFRAA >60 10/01/2011 0721   CrCl cannot be calculated (Patient's most recent lab result is older than the maximum 21 days allowed.).  COAG Lab Results  Component Value Date   INR 0.96 02/12/2017    Radiology No results found.   Assessment/Plan 1. Chronic venous insufficiency Recommend:   No surgery or intervention at this point in time.     I have reviewed my previous discussion with the patient regarding swelling and why it causes symptoms.  Patient needs to wear graduated compression class 1 (20-30 mmHg) on a daily basis.  She resists this but I have shown  her compression wraps which she would be capable of doing.  The patient will  beginning wearing the stockings first thing in the morning and removing them in  the evening. The patient is instructed specifically not to sleep in the stockings.     In addition, behavioral modification including several periods of elevation of the lower extremities during the day will be continued.  This was reviewed with the patient during the initial visit.   The patient will also continue routine exercise, especially walking on a daily basis as was discussed during the initial visit.     Despite conservative treatments including graduated compression therapy class 1 and behavioral modification including exercise and elevation the patient  has not obtained adequate control of the lymphedema.  The patient still has stage 3 lymphedema and therefore, I believe that a new lymph pump is indicated to improve the control of the patient's lymphedema.  Additionally, a lymph pump is warranted because it will reduce the risk of cellulitis and ulceration in the future.  2. Lymphedema of both lower extremities Recommend:   No surgery or intervention at this point in time.     I have reviewed my previous discussion with the patient regarding swelling and why it causes symptoms.  Patient needs to wear graduated compression class 1 (20-30 mmHg) on a daily basis.  She resists this but I have shown her compression wraps which she would be capable of doing.  The patient will  beginning wearing the stockings first thing in the morning and removing them in the evening. The patient is instructed specifically not to sleep in the stockings.     In addition, behavioral modification including several periods of elevation of the lower extremities during the day will be continued.  This was reviewed with the patient during the initial visit.   The patient will also continue routine exercise, especially walking on a daily basis as was discussed during the initial visit.     Despite conservative treatments including graduated compression therapy class 1 and behavioral modification including exercise  and elevation the patient  has not obtained adequate control of the lymphedema.  The patient still has stage 3 lymphedema and therefore, I believe that a new lymph pump is indicated to improve the control of the patient's lymphedema.  Additionally, a lymph pump is warranted because it will reduce the risk of cellulitis and ulceration in the future.  3. Post-phlebitic syndrome Recommend:   No surgery or intervention at this point in time.    She is planning to return to [place] for several months to visit with family.  She has asked that I fill her Eliquis for a 90-day supply so that she will have medication while she is away.   I have reviewed my previous discussion with the patient regarding swelling and why it causes symptoms.  Patient needs to wear graduated compression class 1 (20-30 mmHg) on a daily basis.  She resists this but I have shown her compression wraps which she would be capable of doing.  The patient will  beginning wearing the stockings first thing in the morning and removing them in the evening. The patient is instructed specifically not to sleep in the stockings.     In addition, behavioral modification including several periods of elevation of the lower extremities during the day will be continued.  This was reviewed with the patient during the initial visit.   The patient will also continue routine exercise, especially walking on a  daily basis as was discussed during the initial visit.     Despite conservative treatments including graduated compression therapy class 1 and behavioral modification including exercise and elevation the patient  has not obtained adequate control of the lymphedema.  The patient still has stage 3 lymphedema and therefore, I believe that a new lymph pump is indicated to improve the control of the patient's lymphedema.  Additionally, a lymph pump is warranted because it will reduce the risk of cellulitis and ulceration in the future.  4. Primary  hypertension Continue antihypertensive medications as already ordered, these medications have been reviewed and there are no changes at this time.  5. Type 2 diabetes mellitus without complication, without long-term current use of insulin (HCC) Continue hypoglycemic medications as already ordered, these medications have been reviewed and there are no changes at this time.  Hgb A1C to be monitored as already arranged by primary service  6. Mixed hyperlipidemia Continue statin as ordered and reviewed, no changes at this time    Hortencia Pilar, MD  10/28/2022 2:29 PM

## 2022-10-28 NOTE — Progress Notes (Deleted)
MRN : [id number]  Katie Baker is a 62 y.o. (09/15/1960) female who presents with chief complaint of legs hurt and swell.  History of Present Illness:   The patient presents to the office for evaluation of leg pain and swelling.     DVT was identified at [facility] by Duplex ultrasound in June 2018.  The initial symptoms were SOB secondary to her PE's associated with  pain and swelling in the lower extremity.   The IVC filter was removed  03/24/2018.   Patient states her swelling and leg pain have been increasing.  It is now a problem all the time.  Also having leg cramps.  Overall she feels that her pain is substantially worse and this has prompted her return to the office.   The patient has not been using compression therapy at this point because she is not able to put the stockings on.  She has not been using her Lymph pump stating it isn't working properly and is very worn.  It is 63 years old she states.  She is now spending the majority of her time in a wheelchair because of her disability.  She continues to use a hospital bed and elevates her feet at night.   No SOB or pleuritic chest pains.  No cough or hemoptysis.   No blood per rectum or blood in any sputum.  No excessive bruising per the patient.  Previous ABI's demonstrate normal waveforms all of them are triphasic with normal TBI's.  Patient would not allow for a cuff to be placed on the ankle.  Previous venous duplex demonstrates her deep veins are all patent and compressible bilaterally.  No evidence of reflux is identified.  No outpatient medications have been marked as taking for the 10/31/22 encounter (Appointment) with Delana Meyer, Dolores Lory, MD.    Past Medical History:  Diagnosis Date   Deep vein thrombosis (DVT) ([place])    Diabetes mellitus, type II ([place])    Dislocation of hip, congenital    Fibromyalgia    Gout    Hearing loss    left ear   Hyperlipidemia    Hypertension    Hypothyroidism    Obesity     Prediabetes    Pulmonary embolism ([place]) 01/2017   Reflux    Sleep apnea    Spinal stenosis    Tendonitis    Thyroid cancer [facility])     Past Surgical History:  Procedure Laterality Date   COLONOSCOPY WITH PROPOFOL N/A 02/09/2015   Procedure: COLONOSCOPY WITH PROPOFOL;  Surgeon: [name], MD;  Location: [facility] ENDOSCOPY;  Service: Endoscopy;  Laterality: N/A;   COLONOSCOPY WITH PROPOFOL N/A 12/11/2021   Procedure: COLONOSCOPY WITH PROPOFOL;  Surgeon: Lesly Rubenstein, MD;  Location: [facility] ENDOSCOPY;  Service: Endoscopy;  Laterality: N/A;   COLONOSCOPY WITH PROPOFOL N/A 04/01/2022   Procedure: COLONOSCOPY WITH PROPOFOL;  Surgeon: Lesly Rubenstein, MD;  Location: [facility] ENDOSCOPY;  Service: Endoscopy;  Laterality: N/A;  ENGLISH, DM   ESOPHAGOGASTRODUODENOSCOPY N/A 02/09/2015   Procedure: ESOPHAGOGASTRODUODENOSCOPY (EGD);  Surgeon: [name], MD;  Location: [facility] ENDOSCOPY;  Service: Endoscopy;  Laterality: N/A;   ESOPHAGOGASTRODUODENOSCOPY (EGD) WITH PROPOFOL N/A 12/11/2021   Procedure: ESOPHAGOGASTRODUODENOSCOPY (EGD) WITH PROPOFOL;  Surgeon: Lesly Rubenstein, MD;  Location: [facility] ENDOSCOPY;  Service: Endoscopy;  Laterality: N/A;   HIP SURGERY     IVC FILTER INSERTION N/A 02/14/2017   Procedure: IVC Filter Insertion;  Surgeon: Delana Meyer,  Dolores Lory, MD;  Location: [facility];  Service: Cardiovascular;  Laterality: N/A;   IVC FILTER REMOVAL N/A 03/24/2018   Procedure: IVC FILTER REMOVAL;  Surgeon: Katha Cabal, MD;  Location: [place] CV LAB;  Service: Cardiovascular;  Laterality: N/A;   LIMB SPARING RESECTION HIP W/ SADDLE JOINT REPLACEMENT Bilateral    THYROID SURGERY     TOTAL HIP REVISION      Social History Social History   Tobacco Use   Smoking status: Never   Smokeless tobacco: Never  Vaping Use   Vaping Use: Never used  Substance Use Topics   Alcohol use: No    Comment: socially   Drug use: No    Family History Family History  Problem  Relation Age of Onset   Heart disease Mother    Hypertension Mother    Alzheimer's disease Mother    Diabetes Mother    Stroke Mother    Anxiety disorder Mother    Breast cancer Mother        at 41 years   Diabetes Father    Alcohol abuse Father    Bladder Cancer Sister    Thyroid disease Brother    Diabetes Daughter    Diabetes Maternal Aunt     No Known Allergies   REVIEW OF SYSTEMS (Negative unless checked)  Constitutional: '[]'$ Weight loss  '[]'$ Fever  '[]'$ Chills Cardiac: '[]'$ Chest pain   '[]'$ Chest pressure   '[]'$ Palpitations   '[]'$ Shortness of breath when laying flat   '[]'$ Shortness of breath with exertion. Vascular:  '[]'$ Pain in legs with walking   '[x]'$ Pain in legs at rest  '[]'$ History of DVT   '[]'$ Phlebitis   '[x]'$ Swelling in legs   '[]'$ Varicose veins   '[]'$ Non-healing ulcers Pulmonary:   '[]'$ Uses home oxygen   '[]'$ Productive cough   '[]'$ Hemoptysis   '[]'$ Wheeze  '[]'$ COPD   '[]'$ Asthma Neurologic:  '[]'$ Dizziness   '[]'$ Seizures   '[]'$ History of stroke   '[]'$ History of TIA  '[]'$ Aphasia   '[]'$ Vissual changes   '[]'$ Weakness or numbness in arm   '[x]'$ Weakness or numbness in leg Musculoskeletal:   '[]'$ Joint swelling   '[x]'$ Joint pain   '[x]'$ Low back pain Hematologic:  '[]'$ Easy bruising  '[]'$ Easy bleeding   '[]'$ Hypercoagulable state   '[]'$ Anemic Gastrointestinal:  '[]'$ Diarrhea   '[]'$ Vomiting  '[]'$ Gastroesophageal reflux/heartburn   '[]'$ Difficulty swallowing. Genitourinary:  '[]'$ Chronic kidney disease   '[]'$ Difficult urination  '[]'$ Frequent urination   '[]'$ Blood in urine Skin:  '[]'$ Rashes   '[]'$ Ulcers  Psychological:  '[]'$ History of anxiety   '[]'$  History of major depression.  Physical Examination  There were no vitals filed for this visit. There is no height or weight on file to calculate BMI. Gen: WD/WN, NAD Head: [place]/AT, No temporalis wasting.  Ear/Nose/Throat: Hearing grossly intact, nares w/o erythema or drainage, pinna without lesions Eyes: PER, EOMI, sclera nonicteric.  Neck: Supple, no gross masses.  No JVD.  Pulmonary:  Good air movement, no audible wheezing, no  use of accessory muscles.  Cardiac: RRR, precordium not hyperdynamic. Vascular:  scattered varicosities present bilaterally.  Moderate venous stasis changes to the legs bilaterally.  2+ soft pitting edema. CEAP [id number]   Vessel Right Left  Radial Palpable Palpable  Gastrointestinal: soft, non-distended. No guarding/no peritoneal signs.  Musculoskeletal: M/S 5/5 throughout.  No deformity.  Neurologic: CN 2-12 intact. Pain and light touch intact in extremities.  Symmetrical.  Speech is fluent. Motor exam as listed above. Psychiatric: Judgment intact, Mood & affect appropriate for pt's clinical situation. Dermatologic: Venous rashes no ulcers noted.  No changes  consistent with cellulitis. Lymph : No lichenification or skin changes of chronic lymphedema.  CBC Lab Results  Component Value Date   WBC 7.1 09/11/2022   HGB 13.9 09/11/2022   HCT 42.7 09/11/2022   MCV 92.0 09/11/2022   PLT 188 09/11/2022    BMET    Component Value Date/Time   NA 141 09/11/2022 1300   NA 141 10/01/2011 0721   K 3.5 09/11/2022 1300   K 3.6 10/01/2011 0721   CL 107 09/11/2022 1300   CL 104 10/01/2011 0721   CO2 27 09/11/2022 1300   CO2 28 10/01/2011 0721   GLUCOSE 201 (H) 09/11/2022 1300   GLUCOSE 111 (H) 10/01/2011 0721   BUN 10 09/11/2022 1300   BUN 10 10/01/2011 0721   CREATININE 0.58 09/11/2022 1300   CREATININE 0.51 (L) 10/01/2011 0721   CALCIUM 8.9 09/11/2022 1300   CALCIUM 9.1 10/01/2011 0721   GFRNONAA >60 09/11/2022 1300   GFRNONAA >60 10/01/2011 0721   GFRAA >60 11/24/2019 1001   GFRAA >60 10/01/2011 0721   CrCl cannot be calculated (Patient's most recent lab result is older than the maximum 21 days allowed.).  COAG Lab Results  Component Value Date   INR 0.96 02/12/2017    Radiology No results found.   Assessment/Plan There are no diagnoses linked to this encounter.   Hortencia Pilar, MD  10/28/2022 1:09 PM

## 2022-10-31 ENCOUNTER — Ambulatory Visit (INDEPENDENT_AMBULATORY_CARE_PROVIDER_SITE_OTHER): Payer: Medicare Other | Admitting: Vascular Surgery

## 2022-11-02 ENCOUNTER — Encounter (INDEPENDENT_AMBULATORY_CARE_PROVIDER_SITE_OTHER): Payer: Self-pay | Admitting: Vascular Surgery

## 2022-11-14 ENCOUNTER — Other Ambulatory Visit: Payer: Medicare Other

## 2022-11-21 ENCOUNTER — Ambulatory Visit: Payer: Medicare Other | Admitting: Nurse Practitioner

## 2023-02-05 ENCOUNTER — Other Ambulatory Visit: Payer: Medicare Other

## 2023-04-29 ENCOUNTER — Ambulatory Visit (INDEPENDENT_AMBULATORY_CARE_PROVIDER_SITE_OTHER): Payer: Medicare Other | Admitting: Nurse Practitioner

## 2023-06-04 ENCOUNTER — Ambulatory Visit: Payer: Federal, State, Local not specified - PPO

## 2023-06-27 ENCOUNTER — Telehealth (INDEPENDENT_AMBULATORY_CARE_PROVIDER_SITE_OTHER): Payer: Self-pay

## 2023-06-27 NOTE — Telephone Encounter (Signed)
Patient left a message stating that she is currently in [place] and was requesting for Eliquis refill to be sent to [facility] in [place] Please Advise if we are able to send refill

## 2023-07-02 NOTE — Telephone Encounter (Signed)
Unfortunately providers not license in [place] Patient would need to be seen with urgent care or provider in the area. Patient was made aware with medical recommendations and verbalized understanding

## 2023-07-02 NOTE — Telephone Encounter (Signed)
I left a message on patient voicemail to provide pharmacy information.

## 2023-07-02 NOTE — Telephone Encounter (Signed)
See if we send the Rx to her pharmacy here, can they transfer it to the [place] store.  We may be able to do it that way

## 2023-08-05 ENCOUNTER — Ambulatory Visit: Payer: Federal, State, Local not specified - PPO

## 2023-09-24 ENCOUNTER — Ambulatory Visit: Payer: Medicare Other | Attending: Cardiovascular Disease

## 2023-09-24 DIAGNOSIS — R0602 Shortness of breath: Secondary | ICD-10-CM | POA: Diagnosis present

## 2023-09-24 LAB — ECHOCARDIOGRAM COMPLETE
AR max vel: 2 cm2
AV Area VTI: 1.97 cm2
AV Area mean vel: 1.96 cm2
AV Mean grad: 4.3 mm[Hg]
AV Peak grad: 8.4 mm[Hg]
Ao pk vel: 1.45 m/s
Area-P 1/2: 3.91 cm2
S' Lateral: 3.3 cm
Single Plane A4C EF: 54.4 %

## 2023-09-26 ENCOUNTER — Encounter: Payer: Self-pay | Admitting: Cardiology

## 2023-09-26 ENCOUNTER — Encounter: Payer: Self-pay | Admitting: *Deleted

## 2023-09-26 ENCOUNTER — Ambulatory Visit (INDEPENDENT_AMBULATORY_CARE_PROVIDER_SITE_OTHER): Payer: Medicare Other | Admitting: Cardiology

## 2023-09-26 VITALS — BP 130/82 | HR 88 | Ht <= 58 in | Wt 173.0 lb

## 2023-09-26 DIAGNOSIS — Z1231 Encounter for screening mammogram for malignant neoplasm of breast: Secondary | ICD-10-CM | POA: Diagnosis not present

## 2023-09-26 DIAGNOSIS — G510 Bell's palsy: Secondary | ICD-10-CM

## 2023-09-26 DIAGNOSIS — I10 Essential (primary) hypertension: Secondary | ICD-10-CM

## 2023-09-26 MED ORDER — NYSTATIN-TRIAMCINOLONE 100000-0.1 UNIT/GM-% EX OINT: 1.0000 | TOPICAL_OINTMENT | Freq: Two times a day (BID) | CUTANEOUS | 1 refills | Status: DC

## 2023-09-26 MED ORDER — LISINOPRIL 40 MG PO TABS: 40.0000 mg | ORAL_TABLET | Freq: Every day | ORAL | 1 refills | Status: DC

## 2023-09-26 MED ORDER — VALACYCLOVIR HCL 1 G PO TABS: 1000.0000 mg | ORAL_TABLET | Freq: Two times a day (BID) | ORAL | 0 refills | Status: DC

## 2023-09-26 NOTE — Patient Instructions (Signed)
[facility] at [facility] [address], [address] [place],  [place]  [address]  Main: [phone]

## 2023-09-26 NOTE — Progress Notes (Signed)
Established Patient Office Visit  Subjective:  Patient ID: Katie [facility], female    DOB: 01-20-1961  Age: 63 y.o. MRN: [id number]  Chief Complaint  Patient presents with   Follow-up    Med refills and discuss high bp    Patient in office needing medication refills, and to discuss elevated blood pressure. Patient recently returned from [place] for a short visit to the [place]. Blood pressure improved on recheck. Lisinopril refilled.  Patient seeing endocrinology for her DM and thyroid, had blood work for them done this morning. Unknown if lipid panel was checked.  Over due for mammogram, order placed.  Patient has a history of bells palsy, requesting a refill on Valtrex to have while she is in [place] to use as needed.  Complaining of itching and a rash on legs, not near vaginal area. Will send in Nystatin.     No other concerns at this time.   Past Medical History:  Diagnosis Date   Deep vein thrombosis (DVT) ([facility])    Diabetes mellitus, type II ([facility])    Dislocation of hip, congenital    Fibromyalgia    Gout    Hearing loss    left ear   Hyperlipidemia    Hypertension    Hypothyroidism    Obesity    Prediabetes    Pulmonary embolism ([facility]) 01/2017   Reflux    Sleep apnea    Spinal stenosis    Tendonitis    Thyroid cancer [facility])     Past Surgical History:  Procedure Laterality Date   COLONOSCOPY WITH PROPOFOL N/A 02/09/2015   Procedure: COLONOSCOPY WITH PROPOFOL;  Surgeon: Elnita Maxwell, MD;  Location: [facility] ENDOSCOPY;  Service: Endoscopy;  Laterality: N/A;   COLONOSCOPY WITH PROPOFOL N/A 12/11/2021   Procedure: COLONOSCOPY WITH PROPOFOL;  Surgeon: Regis Bill, MD;  Location: [facility] ENDOSCOPY;  Service: Endoscopy;  Laterality: N/A;   COLONOSCOPY WITH PROPOFOL N/A 04/01/2022   Procedure: COLONOSCOPY WITH PROPOFOL;  Surgeon: Regis Bill, MD;  Location: [facility] ENDOSCOPY;  Service: Endoscopy;  Laterality: N/A;  ENGLISH, DM   ESOPHAGOGASTRODUODENOSCOPY N/A  02/09/2015   Procedure: ESOPHAGOGASTRODUODENOSCOPY (EGD);  Surgeon: Elnita Maxwell, MD;  Location: [facility] ENDOSCOPY;  Service: Endoscopy;  Laterality: N/A;   ESOPHAGOGASTRODUODENOSCOPY (EGD) WITH PROPOFOL N/A 12/11/2021   Procedure: ESOPHAGOGASTRODUODENOSCOPY (EGD) WITH PROPOFOL;  Surgeon: Regis Bill, MD;  Location: [facility] ENDOSCOPY;  Service: Endoscopy;  Laterality: N/A;   HIP SURGERY     IVC FILTER INSERTION N/A 02/14/2017   Procedure: IVC Filter Insertion;  Surgeon: Renford Dills, MD;  Location: [facility] INVASIVE CV LAB;  Service: Cardiovascular;  Laterality: N/A;   IVC FILTER REMOVAL N/A 03/24/2018   Procedure: IVC FILTER REMOVAL;  Surgeon: Renford Dills, MD;  Location: [facility] INVASIVE CV LAB;  Service: Cardiovascular;  Laterality: N/A;   LIMB SPARING RESECTION HIP W/ SADDLE JOINT REPLACEMENT Bilateral    THYROID SURGERY     TOTAL HIP REVISION      Social History   Socioeconomic History   Marital status: Divorced    Spouse name: Not on file   Number of children: Not on file   Years of education: Not on file   Highest education level: Not on file  Occupational History   Not on file  Tobacco Use   Smoking status: Never   Smokeless tobacco: Never  Vaping Use   Vaping status: Never Used  Substance and Sexual Activity   Alcohol use: No    Comment:  socially   Drug use: No   Sexual activity: Yes    Birth control/protection: Post-menopausal  Other Topics Concern   Not on file  Social History Narrative   Not on file   Social Drivers of Health   Financial Resource Strain: Not on file  Food Insecurity: Not on file  Transportation Needs: Not on file  Physical Activity: Not on file  Stress: Not on file  Social Connections: Not on file  Intimate Partner Violence: Not on file    Family History  Problem Relation Age of Onset   Heart disease Mother    Hypertension Mother    Alzheimer's disease Mother    Diabetes Mother    Stroke Mother    Anxiety disorder Mother     Breast cancer Mother        at 71 years   Diabetes Father    Alcohol abuse Father    Bladder Cancer Sister    Thyroid disease Brother    Diabetes Daughter    Diabetes Maternal Aunt     No Known Allergies  Outpatient Medications Prior to Visit  Medication Sig   acetaminophen (TYLENOL) 500 MG tablet Take 1,000 mg by mouth every 6 (six) hours as needed for moderate pain or headache.   allopurinol (ZYLOPRIM) 300 MG tablet Take 300 mg by mouth daily.   apixaban (ELIQUIS) 5 MG TABS tablet Take 1 tablet (5 mg total) by mouth 2 (two) times daily. Please fill for 90 days the patient is going home to see family and needs a 3 month supply  Thanks   Biotin 10 MG TABS Take 10 mg by mouth daily.   carboxymethylcellulose 1 % ophthalmic solution Apply 1 drop to eye 3 (three) times daily.   gabapentin (NEURONTIN) 300 MG capsule Take 300 mg by mouth 2 (two) times daily.   glimepiride (AMARYL) 2 MG tablet Take 2 mg by mouth daily with breakfast.    levothyroxine (SYNTHROID, LEVOTHROID) 175 MCG tablet Take 175 mcg by mouth daily before breakfast.   lidocaine (LIDODERM) 5 % Place 1 patch onto the skin daily. For pain   morphine (MS CONTIN) 15 MG 12 hr tablet Take 1 tablet (15 mg total) by mouth every 12 (twelve) hours.   Multiple Vitamins-Minerals (VITAMIN-MINERAL SUPPLEMENT PO) Take by mouth daily. Calcium, Magnesium, and Zinc plus Vitamin D-3   pantoprazole (PROTONIX) 40 MG tablet Take 40 mg by mouth 2 (two) times daily.    Propylene Glycol (SYSTANE BALANCE OP) Apply to eye as directed.   rosuvastatin (CRESTOR) 40 MG tablet Take 40 mg by mouth daily.    VITAMIN E PO Take by mouth daily.   [DISCONTINUED] lisinopril (ZESTRIL) 40 MG tablet Take 1 tablet (40 mg total) by mouth daily.   Semaglutide (OZEMPIC, 0.25 OR 0.5 MG/DOSE, [place]) Inject into the skin as directed.   [DISCONTINUED] Cholecalciferol 25 MCG (1000 UT) tablet Take 5,000 Units by mouth daily. (Patient not taking: Reported on 09/26/2023)    [DISCONTINUED] furosemide (LASIX) 20 MG tablet Take 1 tablet (20 mg total) by mouth daily for 5 days.   [DISCONTINUED] linaclotide (LINZESS) 145 MCG CAPS capsule Take 1 capsule by mouth daily. (Patient not taking: Reported on 09/26/2023)   [DISCONTINUED] potassium chloride (MICRO-K) 10 MEQ CR capsule  (Patient not taking: Reported on 10/28/2022)   [DISCONTINUED] valACYclovir (VALTREX) 1000 MG tablet Take 1 tablet (1,000 mg total) by mouth 2 (two) times daily. (Patient not taking: Reported on 09/26/2023)   No facility-administered medications prior to visit.  Review of Systems  Constitutional: Negative.   HENT: Negative.    Eyes: Negative.   Respiratory: Negative.  Negative for shortness of breath.   Cardiovascular: Negative.  Negative for chest pain.  Gastrointestinal: Negative.  Negative for abdominal pain, constipation and diarrhea.  Genitourinary: Negative.   Musculoskeletal:  Negative for joint pain and myalgias.  Skin: Negative.   Neurological: Negative.  Negative for dizziness and headaches.  Endo/Heme/Allergies: Negative.   All other systems reviewed and are negative.      Objective:   BP 130/82 (BP Location: Right Arm, Patient Position: Sitting, Cuff Size: Large)   Pulse 88   Ht 4\' 8"  (1.422 m)   Wt 173 lb (78.5 kg)   LMP 07/04/2015 (Approximate) Comment: >2 years ago  SpO2 97%   BMI 38.79 kg/m   Vitals:   09/26/23 1117 09/26/23 1139  BP: (!) 176/82 130/82  Pulse: 88   Height: 4\' 8"  (1.422 m)   Weight: 173 lb (78.5 kg)   SpO2: 97%   BMI (Calculated): 38.81     Physical Exam Vitals and nursing note reviewed.  Constitutional:      Appearance: Normal appearance. She is normal weight.  HENT:     Head: Normocephalic and atraumatic.     Nose: Nose normal.     Mouth/Throat:     Mouth: Mucous membranes are moist.  Eyes:     Extraocular Movements: Extraocular movements intact.     Conjunctiva/sclera: Conjunctivae normal.     Pupils: Pupils are equal, round, and  reactive to light.  Cardiovascular:     Rate and Rhythm: Normal rate and regular rhythm.     Pulses: Normal pulses.     Heart sounds: Normal heart sounds.  Pulmonary:     Effort: Pulmonary effort is normal.     Breath sounds: Normal breath sounds.  Abdominal:     General: Abdomen is flat. Bowel sounds are normal.     Palpations: Abdomen is soft.  Musculoskeletal:        General: Normal range of motion.     Cervical back: Normal range of motion.  Skin:    General: Skin is warm and dry.  Neurological:     General: No focal deficit present.     Mental Status: She is alert and oriented to person, place, and time.  Psychiatric:        Mood and Affect: Mood normal.        Behavior: Behavior normal.        Thought Content: Thought content normal.        Judgment: Judgment normal.      No results found for any visits on 09/26/23.  Recent Results (from the past 2160 hours)  ECHOCARDIOGRAM COMPLETE     Status: None   Collection Time: 09/24/23 12:15 PM  Result Value Ref Range   AR max vel 2.00 cm2   AV Peak grad 8.4 mmHg   Ao pk vel 1.45 m/s   S' Lateral 3.30 cm   Area-P 1/2 3.91 cm2   AV Area VTI 1.97 cm2   AV Mean grad 4.3 mmHg   Single Plane A4C EF 54.4 %   AV Area mean vel 1.96 cm2   Est EF 60 - 65%       Assessment & Plan:  Continue same medications. Valtrex refilled. Nystatin for itching and rash inner thighs.  Schedule mammogram.  Problem List Items Addressed This Visit       Cardiovascular and Mediastinum  BP (high blood pressure) - Primary   Relevant Medications   lisinopril (ZESTRIL) 40 MG tablet     Nervous and Auditory   Bell's palsy   Relevant Medications   valACYclovir (VALTREX) 1000 MG tablet   Other Visit Diagnoses       Essential hypertension, benign       Relevant Medications   lisinopril (ZESTRIL) 40 MG tablet     Breast cancer screening by mammogram       Relevant Orders   MM 3D SCREENING MAMMOGRAM BILATERAL BREAST       Return  in about 6 months (around 03/25/2024).   Total time spent: 25 minutes  [employer], NP  09/26/2023   This document may have been prepared by Dragon Voice Recognition software and as such may include unintentional dictation errors.

## 2023-10-02 ENCOUNTER — Ambulatory Visit (INDEPENDENT_AMBULATORY_CARE_PROVIDER_SITE_OTHER): Payer: Medicare Other | Admitting: Nurse Practitioner

## 2023-10-02 ENCOUNTER — Encounter (INDEPENDENT_AMBULATORY_CARE_PROVIDER_SITE_OTHER): Payer: Self-pay | Admitting: Nurse Practitioner

## 2023-10-02 VITALS — BP 184/90 | HR 84 | Resp 16 | Wt 176.4 lb

## 2023-10-02 DIAGNOSIS — E119 Type 2 diabetes mellitus without complications: Secondary | ICD-10-CM | POA: Diagnosis not present

## 2023-10-02 DIAGNOSIS — I890 Lymphedema, not elsewhere classified: Secondary | ICD-10-CM

## 2023-10-02 DIAGNOSIS — E782 Mixed hyperlipidemia: Secondary | ICD-10-CM

## 2023-10-02 MED ORDER — APIXABAN 5 MG PO TABS: 5.0000 mg | ORAL_TABLET | Freq: Two times a day (BID) | ORAL | 3 refills | Status: DC

## 2023-10-03 ENCOUNTER — Encounter (INDEPENDENT_AMBULATORY_CARE_PROVIDER_SITE_OTHER): Payer: Self-pay | Admitting: Nurse Practitioner

## 2023-10-03 NOTE — Progress Notes (Signed)
Subjective:    Patient ID: Katie Baker, female    DOB: 03-Jun-1961, 63 y.o.   MRN: [id number] Chief Complaint  Patient presents with   Follow-up    6 month follow up    The patient presents to the office for follow-up of her lymphedema.  DVT was identified at [facility] by Duplex ultrasound in June 2018.  The initial symptoms were SOB secondary to her PE's associated with  pain and swelling in the lower extremity.   The IVC filter was removed  03/24/2018.    She has recently returned from [place] after visiting family.  Unfortunately issues with pharmacy caused a mixup and she has not been on her Eliquis for the last month or so.  Fortunately she does not have any significant swelling in her lower extremities.  She has been diligent with elevating her lower extremities.  She also notes that the pain and tenderness that she had previously is actually improved.   No SOB or pleuritic chest pains.  No cough or hemoptysis.      Review of Systems  Cardiovascular:  Negative for leg swelling.  Musculoskeletal:  Positive for gait problem.  All other systems reviewed and are negative.      Objective:   Physical Exam Vitals reviewed.  HENT:     Head: Normocephalic.  Cardiovascular:     Rate and Rhythm: Normal rate.  Pulmonary:     Effort: Pulmonary effort is normal.  Skin:    General: Skin is warm and dry.  Neurological:     Mental Status: She is alert and oriented to person, place, and time.  Psychiatric:        Mood and Affect: Mood normal.        Behavior: Behavior normal.        Thought Content: Thought content normal.        Judgment: Judgment normal.     BP (!) 184/90   Pulse 84   Resp 16   Wt 176 lb 6.4 oz (80 kg)   LMP 07/04/2015 (Approximate) Comment: >2 years ago  BMI 39.55 kg/m   Past Medical History:  Diagnosis Date   Deep vein thrombosis (DVT) (HCC)    Diabetes mellitus, type II (HCC)    Dislocation of hip, congenital    Fibromyalgia    Gout     Hearing loss    left ear   Hyperlipidemia    Hypertension    Hypothyroidism    Obesity    Prediabetes    Pulmonary embolism (HCC) 01/2017   Reflux    Sleep apnea    Spinal stenosis    Tendonitis    Thyroid cancer (HCC)     Social History   Socioeconomic History   Marital status: Divorced    Spouse name: Not on file   Number of children: Not on file   Years of education: Not on file   Highest education level: Not on file  Occupational History   Not on file  Tobacco Use   Smoking status: Never   Smokeless tobacco: Never  Vaping Use   Vaping status: Never Used  Substance and Sexual Activity   Alcohol use: No    Comment: socially   Drug use: No   Sexual activity: Yes    Birth control/protection: Post-menopausal  Other Topics Concern   Not on file  Social History Narrative   Not on file   Social Drivers of Health   Financial Resource Strain: Not  on file  Food Insecurity: Not on file  Transportation Needs: Not on file  Physical Activity: Not on file  Stress: Not on file  Social Connections: Not on file  Intimate Partner Violence: Not on file    Past Surgical History:  Procedure Laterality Date   COLONOSCOPY WITH PROPOFOL N/A 02/09/2015   Procedure: COLONOSCOPY WITH PROPOFOL;  Surgeon: Elnita Maxwell, MD;  Location: [facility] ENDOSCOPY;  Service: Endoscopy;  Laterality: N/A;   COLONOSCOPY WITH PROPOFOL N/A 12/11/2021   Procedure: COLONOSCOPY WITH PROPOFOL;  Surgeon: Regis Bill, MD;  Location: [facility] ENDOSCOPY;  Service: Endoscopy;  Laterality: N/A;   COLONOSCOPY WITH PROPOFOL N/A 04/01/2022   Procedure: COLONOSCOPY WITH PROPOFOL;  Surgeon: Regis Bill, MD;  Location: [facility] ENDOSCOPY;  Service: Endoscopy;  Laterality: N/A;  ENGLISH, DM   ESOPHAGOGASTRODUODENOSCOPY N/A 02/09/2015   Procedure: ESOPHAGOGASTRODUODENOSCOPY (EGD);  Surgeon: Elnita Maxwell, MD;  Location[facility] ENDOSCOPY;  Service: Endoscopy;  Laterality: N/A;   ESOPHAGOGASTRODUODENOSCOPY  (EGD) WITH PROPOFOL N/A 12/11/2021   Procedure: ESOPHAGOGASTRODUODENOSCOPY (EGD) WITH PROPOFOL;  Surgeon: Regis Bill, MD;  Location: [facility] ENDOSCOPY;  Service: Endoscopy;  Laterality: N/A;   HIP SURGERY     IVC FILTER INSERTION N/A 02/14/2017   Procedure: IVC Filter Insertion;  Surgeon: Renford Dills, MD;  Location: [facility] INVASIVE CV LAB;  Service: Cardiovascular;  Laterality: N/A;   IVC FILTER REMOVAL N/A 03/24/2018   Procedure: IVC FILTER REMOVAL;  Surgeon: Renford Dills, MD;  Location: [facility] INVASIVE CV LAB;  Service: Cardiovascular;  Laterality: N/A;   LIMB SPARING RESECTION HIP W/ SADDLE JOINT REPLACEMENT Bilateral    THYROID SURGERY     TOTAL HIP REVISION      Family History  Problem Relation Age of Onset   Heart disease Mother    Hypertension Mother    Alzheimer's disease Mother    Diabetes Mother    Stroke Mother    Anxiety disorder Mother    Breast cancer Mother        at 3 years   Diabetes Father    Alcohol abuse Father    Bladder Cancer Sister    Thyroid disease Brother    Diabetes Daughter    Diabetes Maternal Aunt     No Known Allergies     Latest Ref Rng & Units 09/11/2022    1:00 PM 12/13/2020    2:39 PM 06/20/2020   10:14 AM  CBC  WBC 4.0 - 10.5 K/uL 7.1  13.2  5.8   Hemoglobin 12.0 - 15.0 g/dL 01.0  27.2  53.6   Hematocrit 36.0 - 46.0 % 42.7  42.8  42.0   Platelets 150 - 400 K/uL 188  220  228       CMP     Component Value Date/Time   NA 141 09/11/2022 1300   NA 141 10/01/2011 0721   K 3.5 09/11/2022 1300   K 3.6 10/01/2011 0721   CL 107 09/11/2022 1300   CL 104 10/01/2011 0721   CO2 27 09/11/2022 1300   CO2 28 10/01/2011 0721   GLUCOSE 201 (H) 09/11/2022 1300   GLUCOSE 111 (H) 10/01/2011 0721   BUN 10 09/11/2022 1300   BUN 10 10/01/2011 0721   CREATININE 0.58 09/11/2022 1300   CREATININE 0.51 (L) 10/01/2011 0721   CALCIUM 8.9 09/11/2022 1300   CALCIUM 9.1 10/01/2011 0721   PROT 7.2 09/11/2022 1300   PROT 7.3 10/01/2011  0721   ALBUMIN 3.8 09/11/2022 1300   ALBUMIN 3.6 10/01/2011  0721   AST 24 09/11/2022 1300   AST 17 10/01/2011 0721   ALT 23 09/11/2022 1300   ALT 28 10/01/2011 0721   ALKPHOS 77 09/11/2022 1300   ALKPHOS 79 10/01/2011 0721   BILITOT 0.5 09/11/2022 1300   BILITOT 0.3 10/01/2011 0721   GFRNONAA >60 09/11/2022 1300   GFRNONAA >60 10/01/2011 0721     No results found.     Assessment & Plan:   1. Lymphedema of both lower extremities (Primary) Today the patient's lower extremities have much improved swelling from the last time of seeing her legs.  She also seems to have much less pain and discomfort.  Despite being off of her Eliquis for a short timeframe I do not believe there is any evidence of DVT or blood clot ongoing currently as there is no swelling, redness or significant discomfort.  Patient is advised to continue with conservative therapies and will have her return in 6 months and evaluate her ABIs.  2. Type 2 diabetes mellitus without complication, without long-term current use of insulin (HCC) Continue hypoglycemic medications as already ordered, these medications have been reviewed and there are no changes at this time.  Hgb A1C to be monitored as already arranged by primary service   3. Mixed hyperlipidemia Continue statin as ordered and reviewed, no changes at this time    Current Outpatient Medications on File Prior to Visit  Medication Sig Dispense Refill   acetaminophen (TYLENOL) 500 MG tablet Take 1,000 mg by mouth every 6 (six) hours as needed for moderate pain or headache.     allopurinol (ZYLOPRIM) 300 MG tablet Take 300 mg by mouth daily.     Biotin 10 MG TABS Take 10 mg by mouth daily.     carboxymethylcellulose 1 % ophthalmic solution Apply 1 drop to eye 3 (three) times daily. 30 mL 1   gabapentin (NEURONTIN) 300 MG capsule Take 300 mg by mouth 2 (two) times daily.     glimepiride (AMARYL) 2 MG tablet Take 2 mg by mouth daily with breakfast.       levothyroxine (SYNTHROID, LEVOTHROID) 175 MCG tablet Take 175 mcg by mouth daily before breakfast.  1   lidocaine (LIDODERM) 5 % Place 1 patch onto the skin daily. For pain     lisinopril (ZESTRIL) 40 MG tablet Take 1 tablet (40 mg total) by mouth daily. 90 tablet 1   morphine (MS CONTIN) 15 MG 12 hr tablet Take 1 tablet (15 mg total) by mouth every 12 (twelve) hours. 30 tablet 0   Multiple Vitamins-Minerals (VITAMIN-MINERAL SUPPLEMENT PO) Take by mouth daily. Calcium, Magnesium, and Zinc plus Vitamin D-3     nystatin-triamcinolone ointment (MYCOLOG) Apply 1 Application topically 2 (two) times daily. 30 g 1   pantoprazole (PROTONIX) 40 MG tablet Take 40 mg by mouth 2 (two) times daily.   5   Propylene Glycol (SYSTANE BALANCE OP) Apply to eye as directed.     rosuvastatin (CRESTOR) 40 MG tablet Take 40 mg by mouth daily.      Semaglutide (OZEMPIC, 0.25 OR 0.5 MG/DOSE, [place]) Inject into the skin as directed.     valACYclovir (VALTREX) 1000 MG tablet Take 1 tablet (1,000 mg total) by mouth 2 (two) times daily. 14 tablet 0   VITAMIN E PO Take by mouth daily.     No current facility-administered medications on file prior to visit.    There are no Patient Instructions on file for this visit. No follow-ups on file.  Georgiana Spinner, NP

## 2023-10-06 ENCOUNTER — Ambulatory Visit
Admission: RE | Admit: 2023-10-06 | Discharge: 2023-10-06 | Disposition: A | Discharge status: Home / Self Care | Payer: Medicare Other | Source: Ambulatory Visit | Attending: Cardiology | Admitting: Cardiology

## 2023-10-06 DIAGNOSIS — Z1231 Encounter for screening mammogram for malignant neoplasm of breast: Secondary | ICD-10-CM | POA: Insufficient documentation

## 2023-10-09 ENCOUNTER — Other Ambulatory Visit: Payer: Self-pay | Admitting: Cardiology

## 2023-10-09 DIAGNOSIS — R928 Other abnormal and inconclusive findings on diagnostic imaging of breast: Secondary | ICD-10-CM

## 2023-11-19 ENCOUNTER — Encounter: Payer: Self-pay | Admitting: Internal Medicine

## 2024-01-05 ENCOUNTER — Telehealth (INDEPENDENT_AMBULATORY_CARE_PROVIDER_SITE_OTHER): Payer: Self-pay

## 2024-01-05 ENCOUNTER — Other Ambulatory Visit (INDEPENDENT_AMBULATORY_CARE_PROVIDER_SITE_OTHER): Payer: Self-pay

## 2024-01-05 MED ORDER — APIXABAN 5 MG PO TABS: 5.0000 mg | ORAL_TABLET | Freq: Two times a day (BID) | ORAL | 3 refills | Status: DC

## 2024-01-05 NOTE — Telephone Encounter (Signed)
 Patient left a message requesting for Eliquis  refill to be sent to [facility] in [place] so she will be able pick medication while in [place] Refill has been sent and patient was notified.

## 2024-01-20 ENCOUNTER — Encounter (INDEPENDENT_AMBULATORY_CARE_PROVIDER_SITE_OTHER): Payer: Self-pay

## 2024-01-23 ENCOUNTER — Telehealth: Payer: Self-pay | Admitting: Cardiology

## 2024-01-23 NOTE — Telephone Encounter (Signed)
 Patient left VM requesting a call back about her meds needing to be sent in. Stated she is not in [place] right now and needs to speak with the nurse about getting her meds sent in.

## 2024-01-28 ENCOUNTER — Other Ambulatory Visit: Payer: Self-pay

## 2024-01-28 DIAGNOSIS — I10 Essential (primary) hypertension: Secondary | ICD-10-CM

## 2024-01-28 MED ORDER — LISINOPRIL 40 MG PO TABS: 40.0000 mg | ORAL_TABLET | Freq: Every day | ORAL | 0 refills | Status: DC

## 2024-01-28 MED ORDER — PANTOPRAZOLE SODIUM 40 MG PO TBEC: 40.0000 mg | DELAYED_RELEASE_TABLET | Freq: Two times a day (BID) | ORAL | 0 refills | Status: AC

## 2024-03-23 ENCOUNTER — Ambulatory Visit: Admission: RE | Admit: 2024-03-23 | Payer: Federal, State, Local not specified - PPO | Source: Ambulatory Visit

## 2024-03-25 ENCOUNTER — Other Ambulatory Visit (INDEPENDENT_AMBULATORY_CARE_PROVIDER_SITE_OTHER): Payer: Self-pay | Admitting: Nurse Practitioner

## 2024-03-25 DIAGNOSIS — I890 Lymphedema, not elsewhere classified: Secondary | ICD-10-CM

## 2024-03-25 DIAGNOSIS — E119 Type 2 diabetes mellitus without complications: Secondary | ICD-10-CM

## 2024-03-28 NOTE — Progress Notes (Deleted)
 MRN : [id number]  Katie Baker is a 63 y.o. (1960/09/06) female who presents with chief complaint of legs hurt and swell.  History of Present Illness:   The patient presents to the office for follow-up of her lymphedema and post phlebitic syndrome.   DVT was identified at [facility] by Duplex ultrasound in June 2018.  The initial symptoms were SOB secondary to her PE's associated with  pain and swelling in the lower extremity.   The IVC filter was removed  03/24/2018.   Earlier this year she returned from [place] after visiting family.  Unfortunately issues with pharmacy caused a mixup and she went without Eliquis  for a month or so.  Fortunately she did not have any significant increased swelling in her lower extremities.  She has been diligent with elevating her lower extremities.  She also notes that the pain and tenderness that she had previously is actually improved.   No SOB or pleuritic chest pains.  No cough or hemoptysis.  No outpatient medications have been marked as taking for the 03/29/24 encounter (Appointment) with Jama, Cordella MATSU, MD.    Past Medical History:  Diagnosis Date   Deep vein thrombosis (DVT) (HCC)    Diabetes mellitus, type II (HCC)    Dislocation of hip, congenital    Fibromyalgia    Gout    Hearing loss    left ear   Hyperlipidemia    Hypertension    Hypothyroidism    Obesity    Prediabetes    Pulmonary embolism (HCC) 01/2017   Reflux    Sleep apnea    Spinal stenosis    Tendonitis    Thyroid  cancer [facility])     Past Surgical History:  Procedure Laterality Date   COLONOSCOPY WITH PROPOFOL  N/A 02/09/2015   Procedure: COLONOSCOPY WITH PROPOFOL ;  Surgeon: Donnice Vaughn Manes, MD;  Location: [facility] ENDOSCOPY;  Service: Endoscopy;  Laterality: N/A;   COLONOSCOPY WITH PROPOFOL  N/A 12/11/2021   Procedure: COLONOSCOPY WITH PROPOFOL ;  Surgeon: Maryruth Ole DASEN, MD;  Location: [facility] ENDOSCOPY;  Service: Endoscopy;  Laterality: N/A;    COLONOSCOPY WITH PROPOFOL  N/A 04/01/2022   Procedure: COLONOSCOPY WITH PROPOFOL ;  Surgeon: Maryruth Ole DASEN, MD;  Location: [facility] ENDOSCOPY;  Service: Endoscopy;  Laterality: N/A;  ENGLISH, DM   ESOPHAGOGASTRODUODENOSCOPY N/A 02/09/2015   Procedure: ESOPHAGOGASTRODUODENOSCOPY (EGD);  Surgeon: Donnice Vaughn Manes, MD;  Location: [facility] ENDOSCOPY;  Service: Endoscopy;  Laterality: N/A;   ESOPHAGOGASTRODUODENOSCOPY (EGD) WITH PROPOFOL  N/A 12/11/2021   Procedure: ESOPHAGOGASTRODUODENOSCOPY (EGD) WITH PROPOFOL ;  Surgeon: Maryruth Ole DASEN, MD;  Location: [facility] ENDOSCOPY;  Service: Endoscopy;  Laterality: N/A;   HIP SURGERY     IVC FILTER INSERTION N/A 02/14/2017   Procedure: IVC Filter Insertion;  Surgeon: Jama Cordella MATSU, MD;  Location: [facility] INVASIVE CV LAB;  Service: Cardiovascular;  Laterality: N/A;   IVC FILTER REMOVAL N/A 03/24/2018   Procedure: IVC FILTER REMOVAL;  Surgeon: Jama Cordella MATSU, MD;  Location: [facility] INVASIVE CV LAB;  Service: Cardiovascular;  Laterality: N/A;   LIMB SPARING RESECTION HIP W/ SADDLE JOINT REPLACEMENT Bilateral    THYROID  SURGERY     TOTAL HIP REVISION      Social History Social History   Tobacco Use   Smoking status: Never   Smokeless tobacco: Never  Vaping Use   Vaping status: Never Used  Substance Use Topics   Alcohol use: No    Comment: socially   Drug use: No    Family  History Family History  Problem Relation Age of Onset   Heart disease Mother    Hypertension Mother    Alzheimer's disease Mother    Diabetes Mother    Stroke Mother    Anxiety disorder Mother    Breast cancer Mother        at 33 years   Diabetes Father    Alcohol abuse Father    Bladder Cancer Sister    Thyroid  disease Brother    Diabetes Daughter    Diabetes Maternal Aunt     No Known Allergies   REVIEW OF SYSTEMS (Negative unless checked)  Constitutional: [] Weight loss  [] Fever  [] Chills Cardiac: [] Chest pain   [] Chest pressure   [] Palpitations    [] Shortness of breath when laying flat   [] Shortness of breath with exertion. Vascular:  [] Pain in legs with walking   [x] Pain in legs at rest  [] History of DVT   [] Phlebitis   [x] Swelling in legs   [] Varicose veins   [] Non-healing ulcers Pulmonary:   [] Uses home oxygen   [] Productive cough   [] Hemoptysis   [] Wheeze  [] COPD   [] Asthma Neurologic:  [] Dizziness   [] Seizures   [] History of stroke   [] History of TIA  [] Aphasia   [] Vissual changes   [] Weakness or numbness in arm   [] Weakness or numbness in leg Musculoskeletal:   [] Joint swelling   [] Joint pain   [] Low back pain Hematologic:  [] Easy bruising  [] Easy bleeding   [] Hypercoagulable state   [] Anemic Gastrointestinal:  [] Diarrhea   [] Vomiting  [] Gastroesophageal reflux/heartburn   [] Difficulty swallowing. Genitourinary:  [] Chronic kidney disease   [] Difficult urination  [] Frequent urination   [] Blood in urine Skin:  [] Rashes   [] Ulcers  Psychological:  [] History of anxiety   []  History of major depression.  Physical Examination  There were no vitals filed for this visit. There is no height or weight on file to calculate BMI. Gen: WD/WN, NAD Head: [place]/AT, No temporalis wasting.  Ear/Nose/Throat: Hearing grossly intact, nares w/o erythema or drainage, pinna without lesions Eyes: PER, EOMI, sclera nonicteric.  Neck: Supple, no gross masses.  No JVD.  Pulmonary:  Good air movement, no audible wheezing, no use of accessory muscles.  Cardiac: RRR, precordium not hyperdynamic. Vascular:  scattered varicosities present bilaterally.  Moderate venous stasis changes to the legs bilaterally.  2+ soft pitting edema. CEAP [id number]   Vessel Right Left  Radial Palpable Palpable  Gastrointestinal: soft, non-distended. No guarding/no peritoneal signs.  Musculoskeletal: M/S 5/5 throughout.  No deformity.  Neurologic: CN 2-12 intact. Pain and light touch intact in extremities.  Symmetrical.  Speech is fluent. Motor exam as listed above. Psychiatric:  Judgment intact, Mood & affect appropriate for pt's clinical situation. Dermatologic: Venous rashes no ulcers noted.  No changes consistent with cellulitis. Lymph : No lichenification or skin changes of chronic lymphedema.  CBC Lab Results  Component Value Date   WBC 7.1 09/11/2022   HGB 13.9 09/11/2022   HCT 42.7 09/11/2022   MCV 92.0 09/11/2022   PLT 188 09/11/2022    BMET    Component Value Date/Time   NA 141 09/11/2022 1300   NA 141 10/01/2011 0721   K 3.5 09/11/2022 1300   K 3.6 10/01/2011 0721   CL 107 09/11/2022 1300   CL 104 10/01/2011 0721   CO2 27 09/11/2022 1300   CO2 28 10/01/2011 0721   GLUCOSE 201 (H) 09/11/2022 1300   GLUCOSE 111 (H) 10/01/2011 0721   BUN 10 09/11/2022 1300  BUN 10 10/01/2011 0721   CREATININE 0.58 09/11/2022 1300   CREATININE 0.51 (L) 10/01/2011 0721   CALCIUM 8.9 09/11/2022 1300   CALCIUM 9.1 10/01/2011 0721   GFRNONAA >60 09/11/2022 1300   GFRNONAA >60 10/01/2011 0721   GFRAA >60 11/24/2019 1001   GFRAA >60 10/01/2011 0721   CrCl cannot be calculated (Patient's most recent lab result is older than the maximum 21 days allowed.).  COAG Lab Results  Component Value Date   INR 0.96 02/12/2017    Radiology No results found.   Assessment/Plan There are no diagnoses linked to this encounter.   Cordella Shawl, MD  03/28/2024 3:56 PM

## 2024-03-29 ENCOUNTER — Encounter (INDEPENDENT_AMBULATORY_CARE_PROVIDER_SITE_OTHER): Payer: Medicare Other

## 2024-03-29 ENCOUNTER — Ambulatory Visit (INDEPENDENT_AMBULATORY_CARE_PROVIDER_SITE_OTHER): Payer: Medicare Other | Admitting: Vascular Surgery

## 2024-03-29 DIAGNOSIS — Z86711 Personal history of pulmonary embolism: Secondary | ICD-10-CM

## 2024-03-29 DIAGNOSIS — I10 Essential (primary) hypertension: Secondary | ICD-10-CM

## 2024-03-29 DIAGNOSIS — I890 Lymphedema, not elsewhere classified: Secondary | ICD-10-CM

## 2024-03-29 DIAGNOSIS — E119 Type 2 diabetes mellitus without complications: Secondary | ICD-10-CM

## 2024-03-29 DIAGNOSIS — I87009 Postthrombotic syndrome without complications of unspecified extremity: Secondary | ICD-10-CM

## 2024-03-31 ENCOUNTER — Ambulatory Visit: Payer: Medicare Other | Admitting: Cardiology

## 2024-04-21 ENCOUNTER — Ambulatory Visit (INDEPENDENT_AMBULATORY_CARE_PROVIDER_SITE_OTHER): Admitting: Cardiology

## 2024-04-21 ENCOUNTER — Encounter: Payer: Self-pay | Admitting: Cardiology

## 2024-04-21 VITALS — BP 140/80 | HR 91 | Ht <= 58 in | Wt 169.0 lb

## 2024-04-21 DIAGNOSIS — I10 Essential (primary) hypertension: Secondary | ICD-10-CM

## 2024-04-21 MED ORDER — NYSTATIN-TRIAMCINOLONE 100000-0.1 UNIT/GM-% EX OINT: 1.0000 | TOPICAL_OINTMENT | Freq: Two times a day (BID) | CUTANEOUS | 1 refills | Status: AC

## 2024-04-21 MED ORDER — MUPIROCIN CALCIUM 2 % EX CREA: 1.0000 | TOPICAL_CREAM | Freq: Two times a day (BID) | CUTANEOUS | 0 refills | Status: AC

## 2024-04-21 NOTE — Progress Notes (Signed)
 Established Patient Office Visit  Subjective:  Patient ID: Katie Baker, female    DOB: [birth year]  Age: 63 y.o. MRN: [id number]  Chief Complaint  Patient presents with   Follow-up    Patient in office for regular follow up. Patient recently returned from [place] Patient has been following up with her specialists. GI, ENT, endocrinology. Lab work done by endocrinology, will discuss results with them.  Patient complaining of a bump on the back of her leg, appears to be a bug bite, will send in Mupirocin .  Mammogram done 10/06/23, additional images requested. Patient did not go back. Recommend she call them to schedule the follow up mammogram.    No other concerns at this time.   Past Medical History:  Diagnosis Date   Deep vein thrombosis (DVT) (HCC)    Diabetes mellitus, type II (HCC)    Dislocation of hip, congenital    Fibromyalgia    Gout    Hearing loss    left ear   Hyperlipidemia    Hypertension    Hypothyroidism    Obesity    Prediabetes    Pulmonary embolism (HCC) 01/2017   Reflux    Sleep apnea    Spinal stenosis    Tendonitis    Thyroid  cancer [facility])     Past Surgical History:  Procedure Laterality Date   COLONOSCOPY WITH PROPOFOL  N/A 02/09/2015   Procedure: COLONOSCOPY WITH PROPOFOL ;  Surgeon: Donnice Vaughn Manes, MD;  Location: [facility] ENDOSCOPY;  Service: Endoscopy;  Laterality: N/A;   COLONOSCOPY WITH PROPOFOL  N/A 12/11/2021   Procedure: COLONOSCOPY WITH PROPOFOL ;  Surgeon: Maryruth Ole DASEN, MD;  Location: [facility] ENDOSCOPY;  Service: Endoscopy;  Laterality: N/A;   COLONOSCOPY WITH PROPOFOL  N/A 04/01/2022   Procedure: COLONOSCOPY WITH PROPOFOL ;  Surgeon: Maryruth Ole DASEN, MD;  Location: [facility] ENDOSCOPY;  Service: Endoscopy;  Laterality: N/A;  ENGLISH, DM   ESOPHAGOGASTRODUODENOSCOPY N/A 02/09/2015   Procedure: ESOPHAGOGASTRODUODENOSCOPY (EGD);  Surgeon: Donnice Vaughn Manes, MD;  Location: [facility] ENDOSCOPY;  Service: Endoscopy;  Laterality: N/A;    ESOPHAGOGASTRODUODENOSCOPY (EGD) WITH PROPOFOL  N/A 12/11/2021   Procedure: ESOPHAGOGASTRODUODENOSCOPY (EGD) WITH PROPOFOL ;  Surgeon: Maryruth Ole DASEN, MD;  Location: [facility] ENDOSCOPY;  Service: Endoscopy;  Laterality: N/A;   HIP SURGERY     IVC FILTER INSERTION N/A 02/14/2017   Procedure: IVC Filter Insertion;  Surgeon: Jama Cordella MATSU, MD;  Location: [facility] INVASIVE CV LAB;  Service: Cardiovascular;  Laterality: N/A;   IVC FILTER REMOVAL N/A 03/24/2018   Procedure: IVC FILTER REMOVAL;  Surgeon: Jama Cordella MATSU, MD;  Location: [facility] INVASIVE CV LAB;  Service: Cardiovascular;  Laterality: N/A;   LIMB SPARING RESECTION HIP W/ SADDLE JOINT REPLACEMENT Bilateral    THYROID  SURGERY     TOTAL HIP REVISION      Social History   Socioeconomic History   Marital status: Divorced    Spouse name: Not on file   Number of children: Not on file   Years of education: Not on file   Highest education level: Not on file  Occupational History   Not on file  Tobacco Use   Smoking status: Never   Smokeless tobacco: Never  Vaping Use   Vaping status: Never Used  Substance and Sexual Activity   Alcohol use: No    Comment: socially   Drug use: No   Sexual activity: Yes    Birth control/protection: Post-menopausal  Other Topics Concern   Not on file  Social History Narrative   Not on file  Social Drivers of Corporate investment banker Strain: Not on file  Food Insecurity: Not on file  Transportation Needs: Not on file  Physical Activity: Not on file  Stress: Not on file  Social Connections: Not on file  Intimate Partner Violence: Not on file    Family History  Problem Relation Age of Onset   Heart disease Mother    Hypertension Mother    Alzheimer's disease Mother    Diabetes Mother    Stroke Mother    Anxiety disorder Mother    Breast cancer Mother        at 64 years   Diabetes Father    Alcohol abuse Father    Bladder Cancer Sister    Thyroid  disease Brother    Diabetes  Daughter    Diabetes Maternal Aunt     No Known Allergies  Outpatient Medications Prior to Visit  Medication Sig   acetaminophen  (TYLENOL ) 500 MG tablet Take 1,000 mg by mouth every 6 (six) hours as needed for moderate pain or headache.   allopurinol (ZYLOPRIM) 300 MG tablet Take 300 mg by mouth daily.   apixaban  (ELIQUIS ) 5 MG TABS tablet Take 1 tablet (5 mg total) by mouth 2 (two) times daily.   Biotin 10 MG TABS Take 10 mg by mouth daily.   carboxymethylcellulose 1 % ophthalmic solution Apply 1 drop to eye 3 (three) times daily.   cephALEXin (KEFLEX) 500 MG capsule Take 500 mg by mouth 3 (three) times daily.   glimepiride (AMARYL) 2 MG tablet Take 2 mg by mouth daily with breakfast.    levothyroxine  (SYNTHROID , LEVOTHROID) 175 MCG tablet Take 175 mcg by mouth daily before breakfast.   lidocaine  (LIDODERM ) 5 % Place 1 patch onto the skin daily. For pain   lisinopril  (ZESTRIL ) 40 MG tablet Take 1 tablet (40 mg total) by mouth daily.   Multiple Vitamins-Minerals (VITAMIN-MINERAL SUPPLEMENT PO) Take by mouth daily. Calcium , Magnesium, and Zinc plus Vitamin D-3   pantoprazole  (PROTONIX ) 40 MG tablet Take 1 tablet (40 mg total) by mouth 2 (two) times daily.   Propylene Glycol (SYSTANE BALANCE OP) Apply to eye as directed.   rosuvastatin (CRESTOR) 40 MG tablet Take 40 mg by mouth daily.    Semaglutide (OZEMPIC, 0.25 OR 0.5 MG/DOSE, [place]) Inject into the skin as directed.   VITAMIN E PO Take by mouth daily.   [DISCONTINUED] nystatin -triamcinolone  ointment (MYCOLOG) Apply 1 Application topically 2 (two) times daily.   gabapentin  (NEURONTIN ) 300 MG capsule Take 300 mg by mouth 2 (two) times daily. (Patient not taking: Reported on 04/21/2024)   [DISCONTINUED] morphine  (MS CONTIN ) 15 MG 12 hr tablet Take 1 tablet (15 mg total) by mouth every 12 (twelve) hours. (Patient not taking: Reported on 04/21/2024)   [DISCONTINUED] valACYclovir  (VALTREX ) 1000 MG tablet Take 1 tablet (1,000 mg total) by mouth 2  (two) times daily. (Patient not taking: Reported on 04/21/2024)   No facility-administered medications prior to visit.    Review of Systems  Constitutional: Negative.   HENT: Negative.    Eyes: Negative.   Respiratory: Negative.  Negative for shortness of breath.   Cardiovascular: Negative.  Negative for chest pain.  Gastrointestinal: Negative.  Negative for abdominal pain, constipation and diarrhea.  Genitourinary: Negative.   Musculoskeletal:  Negative for joint pain and myalgias.  Skin: Negative.   Neurological: Negative.  Negative for dizziness and headaches.  Endo/Heme/Allergies: Negative.   All other systems reviewed and are negative.      Objective:  BP (!) 140/80   Pulse 91   Ht 4' 8 (1.422 m)   Wt 169 lb (76.7 kg)   LMP 07/04/2015 (Approximate) Comment: >2 years ago  SpO2 96%   BMI 37.89 kg/m   Vitals:   04/21/24 0913  BP: (!) 140/80  Pulse: 91  Height: 4' 8 (1.422 m)  Weight: 169 lb (76.7 kg)  SpO2: 96%  BMI (Calculated): 37.91    Physical Exam Vitals and nursing note reviewed.  Constitutional:      Appearance: Normal appearance. She is normal weight.  HENT:     Head: Normocephalic and atraumatic.     Nose: Nose normal.     Mouth/Throat:     Mouth: Mucous membranes are moist.  Eyes:     Extraocular Movements: Extraocular movements intact.     Conjunctiva/sclera: Conjunctivae normal.     Pupils: Pupils are equal, round, and reactive to light.  Cardiovascular:     Rate and Rhythm: Normal rate and regular rhythm.     Pulses: Normal pulses.     Heart sounds: Normal heart sounds.  Pulmonary:     Effort: Pulmonary effort is normal.     Breath sounds: Normal breath sounds.  Abdominal:     General: Abdomen is flat. Bowel sounds are normal.     Palpations: Abdomen is soft.  Musculoskeletal:        General: Normal range of motion.     Cervical back: Normal range of motion.  Skin:    General: Skin is warm and dry.      Neurological:      General: No focal deficit present.     Mental Status: She is alert and oriented to person, place, and time.  Psychiatric:        Mood and Affect: Mood normal.        Behavior: Behavior normal.        Thought Content: Thought content normal.        Judgment: Judgment normal.      No results found for any visits on 04/21/24.  No results found for this or any previous visit (from the past 2160 hours).    Assessment & Plan:  Schedule mammogram Follow up with specialists for lab results  Problem List Items Addressed This Visit       Cardiovascular and Mediastinum   BP (high blood pressure) - Primary    Return in about 6 months (around 10/22/2024).   Total time spent: 25 minutes  [employer], NP  04/21/2024   This document may have been prepared by Dragon Voice Recognition software and as such may include unintentional dictation errors.

## 2024-04-26 ENCOUNTER — Ambulatory Visit (INDEPENDENT_AMBULATORY_CARE_PROVIDER_SITE_OTHER): Admitting: Vascular Surgery

## 2024-04-26 ENCOUNTER — Ambulatory Visit (INDEPENDENT_AMBULATORY_CARE_PROVIDER_SITE_OTHER)

## 2024-04-26 VITALS — BP 142/83 | HR 80

## 2024-04-26 DIAGNOSIS — E119 Type 2 diabetes mellitus without complications: Secondary | ICD-10-CM

## 2024-04-26 DIAGNOSIS — I890 Lymphedema, not elsewhere classified: Secondary | ICD-10-CM | POA: Diagnosis not present

## 2024-04-26 DIAGNOSIS — E782 Mixed hyperlipidemia: Secondary | ICD-10-CM

## 2024-04-26 DIAGNOSIS — Z86711 Personal history of pulmonary embolism: Secondary | ICD-10-CM | POA: Diagnosis not present

## 2024-04-26 DIAGNOSIS — I10 Essential (primary) hypertension: Secondary | ICD-10-CM

## 2024-04-26 NOTE — Progress Notes (Unsigned)
 MRN : [id number]  Katie Baker is a 63 y.o. (1961/02/29) female who presents with chief complaint of legs hurt and swell.  History of Present Illness:   The patient presents to the office for follow-up of her lymphedema and post phlebitic syndrome.   DVT was identified at [facility] by Duplex ultrasound in June 2018.  The initial symptoms were SOB secondary to her PE's associated with  pain and swelling in the lower extremity.   The IVC filter was removed  03/24/2018.   Earlier this year she returned from [place] after visiting family.  Unfortunately issues with pharmacy caused a mixup and she went without Eliquis  for a month or so.  Fortunately she did not have any significant increased swelling in her lower extremities.  She has been diligent with elevating her lower extremities.  She also notes that the pain and tenderness that she had previously is actually improved.   No SOB or pleuritic chest pains.  No cough or hemoptysis.  No outpatient medications have been marked as taking for the 04/26/24 encounter (Appointment) with Jama, Cordella MATSU, MD.    Past Medical History:  Diagnosis Date   Deep vein thrombosis (DVT) (HCC)    Diabetes mellitus, type II (HCC)    Dislocation of hip, congenital    Fibromyalgia    Gout    Hearing loss    left ear   Hyperlipidemia    Hypertension    Hypothyroidism    Obesity    Prediabetes    Pulmonary embolism (HCC) 01/2017   Reflux    Sleep apnea    Spinal stenosis    Tendonitis    Thyroid  cancer [facility])     Past Surgical History:  Procedure Laterality Date   COLONOSCOPY WITH PROPOFOL  N/A 02/09/2015   Procedure: COLONOSCOPY WITH PROPOFOL ;  Surgeon: Donnice Vaughn Manes, MD;  Location: [facility] ENDOSCOPY;  Service: Endoscopy;  Laterality: N/A;   COLONOSCOPY WITH PROPOFOL  N/A 12/11/2021   Procedure: COLONOSCOPY WITH PROPOFOL ;  Surgeon: Maryruth Ole DASEN, MD;  Location: [facility] ENDOSCOPY;  Service: Endoscopy;  Laterality: N/A;    COLONOSCOPY WITH PROPOFOL  N/A 04/01/2022   Procedure: COLONOSCOPY WITH PROPOFOL ;  Surgeon: Maryruth Ole DASEN, MD;  Location: [facility] ENDOSCOPY;  Service: Endoscopy;  Laterality: N/A;  ENGLISH, DM   ESOPHAGOGASTRODUODENOSCOPY N/A 02/09/2015   Procedure: ESOPHAGOGASTRODUODENOSCOPY (EGD);  Surgeon: Donnice Vaughn Manes, MD;  Location: [facility] ENDOSCOPY;  Service: Endoscopy;  Laterality: N/A;   ESOPHAGOGASTRODUODENOSCOPY (EGD) WITH PROPOFOL  N/A 12/11/2021   Procedure: ESOPHAGOGASTRODUODENOSCOPY (EGD) WITH PROPOFOL ;  Surgeon: Maryruth Ole DASEN, MD;  Location: [facility] ENDOSCOPY;  Service: Endoscopy;  Laterality: N/A;   HIP SURGERY     IVC FILTER INSERTION N/A 02/14/2017   Procedure: IVC Filter Insertion;  Surgeon: Jama Cordella MATSU, MD;  Location: [facility] INVASIVE CV LAB;  Service: Cardiovascular;  Laterality: N/A;   IVC FILTER REMOVAL N/A 03/24/2018   Procedure: IVC FILTER REMOVAL;  Surgeon: Jama Cordella MATSU, MD;  Location: [facility] INVASIVE CV LAB;  Service: Cardiovascular;  Laterality: N/A;   LIMB SPARING RESECTION HIP W/ SADDLE JOINT REPLACEMENT Bilateral    THYROID  SURGERY     TOTAL HIP REVISION      Social History Social History   Tobacco Use   Smoking status: Never   Smokeless tobacco: Never  Vaping Use   Vaping status: Never Used  Substance Use Topics   Alcohol use: No    Comment: socially   Drug use: No    Family  History Family History  Problem Relation Age of Onset   Heart disease Mother    Hypertension Mother    Alzheimer's disease Mother    Diabetes Mother    Stroke Mother    Anxiety disorder Mother    Breast cancer Mother        at 68 years   Diabetes Father    Alcohol abuse Father    Bladder Cancer Sister    Thyroid  disease Brother    Diabetes Daughter    Diabetes Maternal Aunt     No Known Allergies   REVIEW OF SYSTEMS (Negative unless checked)  Constitutional: [] Weight loss  [] Fever  [] Chills Cardiac: [] Chest pain   [] Chest pressure   [] Palpitations    [] Shortness of breath when laying flat   [] Shortness of breath with exertion. Vascular:  [] Pain in legs with walking   [x] Pain in legs at rest  [] History of DVT   [] Phlebitis   [x] Swelling in legs   [] Varicose veins   [] Non-healing ulcers Pulmonary:   [] Uses home oxygen   [] Productive cough   [] Hemoptysis   [] Wheeze  [] COPD   [] Asthma Neurologic:  [] Dizziness   [] Seizures   [] History of stroke   [] History of TIA  [] Aphasia   [] Vissual changes   [] Weakness or numbness in arm   [] Weakness or numbness in leg Musculoskeletal:   [] Joint swelling   [] Joint pain   [] Low back pain Hematologic:  [] Easy bruising  [] Easy bleeding   [] Hypercoagulable state   [] Anemic Gastrointestinal:  [] Diarrhea   [] Vomiting  [] Gastroesophageal reflux/heartburn   [] Difficulty swallowing. Genitourinary:  [] Chronic kidney disease   [] Difficult urination  [] Frequent urination   [] Blood in urine Skin:  [] Rashes   [] Ulcers  Psychological:  [] History of anxiety   []  History of major depression.  Physical Examination  There were no vitals filed for this visit. There is no height or weight on file to calculate BMI. Gen: WD/WN, NAD Head: [place]/AT, No temporalis wasting.  Ear/Nose/Throat: Hearing grossly intact, nares w/o erythema or drainage, pinna without lesions Eyes: PER, EOMI, sclera nonicteric.  Neck: Supple, no gross masses.  No JVD.  Pulmonary:  Good air movement, no audible wheezing, no use of accessory muscles.  Cardiac: RRR, precordium not hyperdynamic. Vascular:  scattered varicosities present bilaterally.  Moderate venous stasis changes to the legs bilaterally.  2+ soft pitting edema. CEAP [id number]   Vessel Right Left  Radial Palpable Palpable  Gastrointestinal: soft, non-distended. No guarding/no peritoneal signs.  Musculoskeletal: M/S 5/5 throughout.  No deformity.  Neurologic: CN 2-12 intact. Pain and light touch intact in extremities.  Symmetrical.  Speech is fluent. Motor exam as listed above. Psychiatric:  Judgment intact, Mood & affect appropriate for pt's clinical situation. Dermatologic: Venous rashes no ulcers noted.  No changes consistent with cellulitis. Lymph : No lichenification or skin changes of chronic lymphedema.  CBC Lab Results  Component Value Date   WBC 7.1 09/11/2022   HGB 13.9 09/11/2022   HCT 42.7 09/11/2022   MCV 92.0 09/11/2022   PLT 188 09/11/2022    BMET    Component Value Date/Time   NA 141 09/11/2022 1300   NA 141 10/01/2011 0721   K 3.5 09/11/2022 1300   K 3.6 10/01/2011 0721   CL 107 09/11/2022 1300   CL 104 10/01/2011 0721   CO2 27 09/11/2022 1300   CO2 28 10/01/2011 0721   GLUCOSE 201 (H) 09/11/2022 1300   GLUCOSE 111 (H) 10/01/2011 0721   BUN 10 09/11/2022 1300  BUN 10 10/01/2011 0721   CREATININE 0.58 09/11/2022 1300   CREATININE 0.51 (L) 10/01/2011 0721   CALCIUM  8.9 09/11/2022 1300   CALCIUM  9.1 10/01/2011 0721   GFRNONAA >60 09/11/2022 1300   GFRNONAA >60 10/01/2011 0721   GFRAA >60 11/24/2019 1001   GFRAA >60 10/01/2011 0721   CrCl cannot be calculated (Patient's most recent lab result is older than the maximum 21 days allowed.).  COAG Lab Results  Component Value Date   INR 0.96 02/12/2017    Radiology No results found.   Assessment/Plan There are no diagnoses linked to this encounter.   Cordella Shawl, MD  04/26/2024 10:55 AM

## 2024-04-27 ENCOUNTER — Encounter (INDEPENDENT_AMBULATORY_CARE_PROVIDER_SITE_OTHER): Payer: Self-pay | Admitting: Vascular Surgery

## 2024-04-27 MED ORDER — APIXABAN 5 MG PO TABS: 5.0000 mg | ORAL_TABLET | Freq: Two times a day (BID) | ORAL | 3 refills | Status: AC

## 2024-05-04 LAB — VAS US ABI WITH/WO TBI
Left ABI: 1.08
Right ABI: 1.03

## 2024-05-10 ENCOUNTER — Other Ambulatory Visit: Payer: Self-pay | Admitting: Internal Medicine

## 2024-05-10 ENCOUNTER — Telehealth: Payer: Self-pay | Admitting: Cardiology

## 2024-05-10 DIAGNOSIS — I10 Essential (primary) hypertension: Secondary | ICD-10-CM

## 2024-05-10 MED ORDER — LISINOPRIL 40 MG PO TABS: 40.0000 mg | ORAL_TABLET | Freq: Every day | ORAL | 0 refills | Status: DC

## 2024-05-10 NOTE — Progress Notes (Signed)
 Patient in [place] requesting prescription refill for Lisinopril . Prescription refill sent.

## 2024-05-10 NOTE — Telephone Encounter (Signed)
 [place] and she [place] her lisinopril , can't get it at [facility] because they are required to have an MD to sign it  Pt hasn't taken these meds in a few days Please adivse

## 2024-06-24 ENCOUNTER — Other Ambulatory Visit: Payer: Self-pay | Admitting: Internal Medicine

## 2024-06-24 DIAGNOSIS — I10 Essential (primary) hypertension: Secondary | ICD-10-CM

## 2024-06-29 ENCOUNTER — Ambulatory Visit (INDEPENDENT_AMBULATORY_CARE_PROVIDER_SITE_OTHER): Admitting: Cardiology

## 2024-06-29 ENCOUNTER — Ambulatory Visit: Payer: Self-pay | Admitting: Cardiology

## 2024-06-29 ENCOUNTER — Encounter: Payer: Self-pay | Admitting: Cardiology

## 2024-06-29 VITALS — BP 119/86 | HR 91 | Ht <= 58 in | Wt 168.6 lb

## 2024-06-29 DIAGNOSIS — E1159 Type 2 diabetes mellitus with other circulatory complications: Secondary | ICD-10-CM

## 2024-06-29 DIAGNOSIS — I10 Essential (primary) hypertension: Secondary | ICD-10-CM | POA: Diagnosis not present

## 2024-06-29 DIAGNOSIS — J0140 Acute pansinusitis, unspecified: Secondary | ICD-10-CM | POA: Insufficient documentation

## 2024-06-29 DIAGNOSIS — E119 Type 2 diabetes mellitus without complications: Secondary | ICD-10-CM | POA: Diagnosis not present

## 2024-06-29 DIAGNOSIS — E782 Mixed hyperlipidemia: Secondary | ICD-10-CM

## 2024-06-29 DIAGNOSIS — E1169 Type 2 diabetes mellitus with other specified complication: Secondary | ICD-10-CM | POA: Insufficient documentation

## 2024-06-29 DIAGNOSIS — I152 Hypertension secondary to endocrine disorders: Secondary | ICD-10-CM

## 2024-06-29 LAB — POCT CBG (FASTING - GLUCOSE)-MANUAL ENTRY: Glucose Fasting, POC: 497 mg/dL — AB (ref 70–99)

## 2024-06-29 MED ORDER — AMOXICILLIN-POT CLAVULANATE 875-125 MG PO TABS: 1.0000 | ORAL_TABLET | Freq: Two times a day (BID) | ORAL | 0 refills | Status: AC

## 2024-06-29 MED ORDER — LISINOPRIL 40 MG PO TABS: 40.0000 mg | ORAL_TABLET | Freq: Every day | ORAL | 1 refills | Status: DC

## 2024-06-29 MED ORDER — FLUTICASONE PROPIONATE 50 MCG/ACT NA SUSP: 2.0000 | Freq: Every day | NASAL | 4 refills | Status: AC

## 2024-06-29 NOTE — Progress Notes (Signed)
 Established Patient Office Visit  Subjective:  Patient ID: Katie Baker, female    DOB: 1961-07-17  Age: 63 y.o. MRN: [id number]  Chief Complaint  Patient presents with   Acute Visit    Bilateral ear pain and sinus pressure on and off for past few month. Yesterday was intolerable.     Patient in office for an acute visit, complaining of bilateral ear pain, sinus pain. Patient reports ear pain and sinus pain have been off and on since she returned from [place] in August 2025. Also complaining of congestion, and sore throat. Will send in Augmentin, refill Flonase .  Patient requested her blood sugar be checked today, non fasting. Patient seen by endocrinology on 04/21/24. Patient diabetes uncontrolled. Endocrinology sent in a Libre 3 and started patient on Insulin . Patient did not pick up either from the pharmacy. Discussed importance of getting diabetes under control. Recommend following up with endocrinology.   Sinusitis This is a new problem. The current episode started more than 1 month ago. The problem has been waxing and waning since onset. There has been no fever. Associated symptoms include congestion, ear pain, sinus pressure and a sore throat. Pertinent negatives include no coughing. Past treatments include acetaminophen . The treatment provided no relief.    No other concerns at this time.   Past Medical History:  Diagnosis Date   Deep vein thrombosis (DVT) (HCC)    Diabetes mellitus, type II (HCC)    Dislocation of hip, congenital    Fibromyalgia    Gout    Hearing loss    left ear   Hyperlipidemia    Hypertension    Hypothyroidism    Obesity    Prediabetes    Pulmonary embolism (HCC) 01/2017   Reflux    Sleep apnea    Spinal stenosis    Tendonitis    Thyroid  cancer [facility])     Past Surgical History:  Procedure Laterality Date   COLONOSCOPY WITH PROPOFOL  N/A 02/09/2015   Procedure: COLONOSCOPY WITH PROPOFOL ;  Surgeon: Donnice Vaughn Manes, MD;  Location:  [facility] ENDOSCOPY;  Service: Endoscopy;  Laterality: N/A;   COLONOSCOPY WITH PROPOFOL  N/A 12/11/2021   Procedure: COLONOSCOPY WITH PROPOFOL ;  Surgeon: Maryruth Ole DASEN, MD;  Location: [facility] ENDOSCOPY;  Service: Endoscopy;  Laterality: N/A;   COLONOSCOPY WITH PROPOFOL  N/A 04/01/2022   Procedure: COLONOSCOPY WITH PROPOFOL ;  Surgeon: Maryruth Ole DASEN, MD;  Location: [facility] ENDOSCOPY;  Service: Endoscopy;  Laterality: N/A;  ENGLISH, DM   ESOPHAGOGASTRODUODENOSCOPY N/A 02/09/2015   Procedure: ESOPHAGOGASTRODUODENOSCOPY (EGD);  Surgeon: Donnice Vaughn Manes, MD;  Location: [facility] ENDOSCOPY;  Service: Endoscopy;  Laterality: N/A;   ESOPHAGOGASTRODUODENOSCOPY (EGD) WITH PROPOFOL  N/A 12/11/2021   Procedure: ESOPHAGOGASTRODUODENOSCOPY (EGD) WITH PROPOFOL ;  Surgeon: Maryruth Ole DASEN, MD;  Location: [facility] ENDOSCOPY;  Service: Endoscopy;  Laterality: N/A;   HIP SURGERY     IVC FILTER INSERTION N/A 02/14/2017   Procedure: IVC Filter Insertion;  Surgeon: Jama Cordella MATSU, MD;  Location: [facility] INVASIVE CV LAB;  Service: Cardiovascular;  Laterality: N/A;   IVC FILTER REMOVAL N/A 03/24/2018   Procedure: IVC FILTER REMOVAL;  Surgeon: Jama Cordella MATSU, MD;  Location: [facility] INVASIVE CV LAB;  Service: Cardiovascular;  Laterality: N/A;   LIMB SPARING RESECTION HIP W/ SADDLE JOINT REPLACEMENT Bilateral    THYROID  SURGERY     TOTAL HIP REVISION      Social History   Socioeconomic History   Marital status: Divorced    Spouse name: Not on file   Number of  children: Not on file   Years of education: Not on file   Highest education level: Not on file  Occupational History   Not on file  Tobacco Use   Smoking status: Never   Smokeless tobacco: Never  Vaping Use   Vaping status: Never Used  Substance and Sexual Activity   Alcohol use: No    Comment: socially   Drug use: No   Sexual activity: Yes    Birth control/protection: Post-menopausal  Other Topics Concern   Not on file  Social History Narrative    Not on file   Social Drivers of Health   Financial Resource Strain: Not on file  Food Insecurity: Not on file  Transportation Needs: Not on file  Physical Activity: Not on file  Stress: Not on file  Social Connections: Not on file  Intimate Partner Violence: Not on file    Family History  Problem Relation Age of Onset   Heart disease Mother    Hypertension Mother    Alzheimer's disease Mother    Diabetes Mother    Stroke Mother    Anxiety disorder Mother    Breast cancer Mother        at 41 years   Diabetes Father    Alcohol abuse Father    Bladder Cancer Sister    Thyroid  disease Brother    Diabetes Daughter    Diabetes Maternal Aunt     No Known Allergies  Outpatient Medications Prior to Visit  Medication Sig   acetaminophen  (TYLENOL ) 500 MG tablet Take 1,000 mg by mouth every 6 (six) hours as needed for moderate pain or headache.   allopurinol (ZYLOPRIM) 300 MG tablet Take 300 mg by mouth daily.   apixaban  (ELIQUIS ) 5 MG TABS tablet Take 1 tablet (5 mg total) by mouth 2 (two) times daily.   Biotin 10 MG TABS Take 10 mg by mouth daily.   carboxymethylcellulose 1 % ophthalmic solution Apply 1 drop to eye 3 (three) times daily.   glimepiride (AMARYL) 2 MG tablet Take 2 mg by mouth daily with breakfast.    HYDROcodone -acetaminophen  (NORCO/VICODIN) 5-325 MG tablet SMARTSIG:1 Tablet(s) By Mouth Every 12 Hours PRN   levothyroxine  (SYNTHROID , LEVOTHROID) 175 MCG tablet Take 175 mcg by mouth daily before breakfast.   lidocaine  (LIDODERM ) 5 % Place 1 patch onto the skin daily. For pain   Multiple Vitamins-Minerals (VITAMIN-MINERAL SUPPLEMENT PO) Take by mouth daily. Calcium , Magnesium, and Zinc plus Vitamin D-3   mupirocin  cream (BACTROBAN ) 2 % Apply 1 Application topically 2 (two) times daily.   nystatin -triamcinolone  ointment (MYCOLOG) Apply 1 Application topically 2 (two) times daily.   pantoprazole  (PROTONIX ) 40 MG tablet Take 1 tablet (40 mg total) by mouth 2 (two) times  daily.   Propylene Glycol (SYSTANE BALANCE OP) Apply to eye as directed.   rosuvastatin (CRESTOR) 40 MG tablet Take 40 mg by mouth daily.    Semaglutide (OZEMPIC, 0.25 OR 0.5 MG/DOSE, [place]) Inject into the skin as directed.   VITAMIN E PO Take by mouth daily.   [DISCONTINUED] cephALEXin (KEFLEX) 500 MG capsule Take 500 mg by mouth 3 (three) times daily.   [DISCONTINUED] fluticasone  (FLONASE ) 50 MCG/ACT nasal spray Place into both nostrils daily.   [DISCONTINUED] lisinopril  (ZESTRIL ) 40 MG tablet TAKE 1 TABLET(40 MG) BY MOUTH DAILY   gabapentin  (NEURONTIN ) 300 MG capsule Take 300 mg by mouth 2 (two) times daily. (Patient not taking: Reported on 06/29/2024)   No facility-administered medications prior to visit.    Review  of Systems  Constitutional: Negative.   HENT:  Positive for congestion, ear pain, sinus pressure and sore throat.   Eyes: Negative.   Respiratory:  Negative for cough.   Cardiovascular: Negative.  Negative for chest pain.  Gastrointestinal: Negative.  Negative for constipation.  Genitourinary: Negative.   Musculoskeletal:  Negative for joint pain and myalgias.  Skin: Negative.   Neurological: Negative.  Negative for dizziness.  Endo/Heme/Allergies: Negative.   All other systems reviewed and are negative.      Objective:   BP 119/86   Pulse 91   Ht 4' 8 (1.422 m)   Wt 168 lb 9.6 oz (76.5 kg)   LMP 07/04/2015 (Approximate) Comment: >2 years ago  SpO2 96%   BMI 37.80 kg/m   Vitals:   06/29/24 1015  BP: 119/86  Pulse: 91  Height: 4' 8 (1.422 m)  Weight: 168 lb 9.6 oz (76.5 kg)  SpO2: 96%  BMI (Calculated): 37.82    Physical Exam Vitals and nursing note reviewed.  Constitutional:      Appearance: Normal appearance. She is normal weight.  HENT:     Head: Normocephalic and atraumatic.     Nose: Nose normal.     Mouth/Throat:     Mouth: Mucous membranes are moist.  Eyes:     Extraocular Movements: Extraocular movements intact.      Conjunctiva/sclera: Conjunctivae normal.     Pupils: Pupils are equal, round, and reactive to light.  Cardiovascular:     Rate and Rhythm: Normal rate and regular rhythm.     Pulses: Normal pulses.     Heart sounds: Normal heart sounds.  Pulmonary:     Effort: Pulmonary effort is normal.     Breath sounds: Normal breath sounds.  Abdominal:     General: Abdomen is flat. Bowel sounds are normal.     Palpations: Abdomen is soft.  Musculoskeletal:        General: Normal range of motion.     Cervical back: Normal range of motion.  Skin:    General: Skin is warm and dry.  Neurological:     General: No focal deficit present.     Mental Status: She is alert and oriented to person, place, and time.  Psychiatric:        Mood and Affect: Mood normal.        Behavior: Behavior normal.        Thought Content: Thought content normal.        Judgment: Judgment normal.      Results for orders placed or performed in visit on 06/29/24  POCT CBG (Fasting - Glucose)  Result Value Ref Range   Glucose Fasting, POC 497 (A) 70 - 99 mg/dL    Recent Results (from the past 2160 hours)  VAS US  ABI WITH/WO TBI     Status: None   Collection Time: 04/26/24 11:43 AM  Result Value Ref Range   Right ABI 1.03    Left ABI 1.08   POCT CBG (Fasting - Glucose)     Status: Abnormal   Collection Time: 06/29/24 10:26 AM  Result Value Ref Range   Glucose Fasting, POC 497 (A) 70 - 99 mg/dL      Assessment & Plan:  Augmentin Flonase  Follow up with endocrinology  Problem List Items Addressed This Visit       Cardiovascular and Mediastinum   Hypertension associated with diabetes (HCC)   Relevant Medications   lisinopril  (ZESTRIL ) 40 MG tablet  Respiratory   Acute non-recurrent pansinusitis - Primary   Relevant Medications   amoxicillin-clavulanate (AUGMENTIN) 875-125 MG tablet   fluticasone  (FLONASE ) 50 MCG/ACT nasal spray     Endocrine   Type 2 diabetes mellitus without complication, without  long-term current use of insulin  (HCC)   Relevant Medications   lisinopril  (ZESTRIL ) 40 MG tablet   Other Relevant Orders   POCT CBG (Fasting - Glucose) (Completed)   DM type 2 with diabetic mixed hyperlipidemia (HCC)   Relevant Medications   lisinopril  (ZESTRIL ) 40 MG tablet   Other Visit Diagnoses       Essential hypertension, benign       Relevant Medications   lisinopril  (ZESTRIL ) 40 MG tablet       Return if symptoms worsen or fail to improve, for as scheduled.   Total time spent: 25 minutes. This time includes review of previous notes and results and patient face to face interaction during today's visit.    Jeoffrey Pollen, NP  06/29/2024   This document may have been prepared by [facility] Voice Recognition software and as such may include unintentional dictation errors.

## 2024-08-20 ENCOUNTER — Other Ambulatory Visit: Payer: Self-pay

## 2024-08-20 DIAGNOSIS — I10 Essential (primary) hypertension: Secondary | ICD-10-CM

## 2024-08-23 MED ORDER — LISINOPRIL 40 MG PO TABS: 40.0000 mg | ORAL_TABLET | Freq: Every day | ORAL | 0 refills | Status: AC

## 2024-10-21 ENCOUNTER — Ambulatory Visit: Admitting: Cardiology

## 2024-10-28 ENCOUNTER — Ambulatory Visit (INDEPENDENT_AMBULATORY_CARE_PROVIDER_SITE_OTHER): Admitting: Nurse Practitioner

## 2024-10-28 ENCOUNTER — Encounter (INDEPENDENT_AMBULATORY_CARE_PROVIDER_SITE_OTHER)
